# Patient Record
Sex: Female | Born: 1965 | Race: Black or African American | Hispanic: No | Marital: Single | Smoking: Current some day smoker
Health system: Southern US, Community
[De-identification: ages and names within clinical notes are randomized; demographics above are authoritative.]

## PROBLEM LIST (undated history)

## (undated) ENCOUNTER — Emergency Department (HOSPITAL_COMMUNITY): Admission: EM | Payer: Self-pay | Source: Home / Self Care

## (undated) DIAGNOSIS — C801 Malignant (primary) neoplasm, unspecified: Secondary | ICD-10-CM

## (undated) DIAGNOSIS — N200 Calculus of kidney: Secondary | ICD-10-CM

## (undated) DIAGNOSIS — E785 Hyperlipidemia, unspecified: Secondary | ICD-10-CM

## (undated) DIAGNOSIS — G8929 Other chronic pain: Secondary | ICD-10-CM

## (undated) DIAGNOSIS — M549 Dorsalgia, unspecified: Secondary | ICD-10-CM

## (undated) DIAGNOSIS — E119 Type 2 diabetes mellitus without complications: Secondary | ICD-10-CM

## (undated) DIAGNOSIS — C55 Malignant neoplasm of uterus, part unspecified: Secondary | ICD-10-CM

## (undated) DIAGNOSIS — D649 Anemia, unspecified: Secondary | ICD-10-CM

## (undated) HISTORY — PX: TUBAL LIGATION: SHX77

## (undated) HISTORY — PX: KIDNEY STONE SURGERY: SHX686

## (undated) HISTORY — DX: Other chronic pain: G89.29

## (undated) HISTORY — DX: Hyperlipidemia, unspecified: E78.5

## (undated) HISTORY — DX: Type 2 diabetes mellitus without complications: E11.9

## (undated) HISTORY — PX: ABDOMINAL HYSTERECTOMY: SHX81

## (undated) HISTORY — PX: NO PAST SURGERIES: SHX2092

## (undated) HISTORY — DX: Anemia, unspecified: D64.9

## (undated) HISTORY — DX: Dorsalgia, unspecified: M54.9

---

## 2002-03-02 ENCOUNTER — Emergency Department (HOSPITAL_COMMUNITY): Admission: EM | Admit: 2002-03-02 | Discharge: 2002-03-02 | Payer: Self-pay | Admitting: Emergency Medicine

## 2002-04-11 ENCOUNTER — Emergency Department (HOSPITAL_COMMUNITY): Admission: EM | Admit: 2002-04-11 | Discharge: 2002-04-11 | Payer: Self-pay | Admitting: Emergency Medicine

## 2002-07-25 ENCOUNTER — Emergency Department (HOSPITAL_COMMUNITY): Admission: EM | Admit: 2002-07-25 | Discharge: 2002-07-25 | Payer: Self-pay | Admitting: Emergency Medicine

## 2002-10-26 ENCOUNTER — Encounter: Payer: Self-pay | Admitting: Obstetrics

## 2002-10-26 ENCOUNTER — Ambulatory Visit (HOSPITAL_COMMUNITY): Admission: RE | Admit: 2002-10-26 | Discharge: 2002-10-26 | Payer: Self-pay | Admitting: Obstetrics

## 2002-11-05 ENCOUNTER — Emergency Department (HOSPITAL_COMMUNITY): Admission: EM | Admit: 2002-11-05 | Discharge: 2002-11-05 | Payer: Self-pay | Admitting: Emergency Medicine

## 2002-11-05 ENCOUNTER — Encounter: Payer: Self-pay | Admitting: Emergency Medicine

## 2002-11-06 ENCOUNTER — Emergency Department (HOSPITAL_COMMUNITY): Admission: EM | Admit: 2002-11-06 | Discharge: 2002-11-07 | Payer: Self-pay

## 2002-11-09 ENCOUNTER — Encounter: Payer: Self-pay | Admitting: Urology

## 2002-11-09 ENCOUNTER — Ambulatory Visit (HOSPITAL_BASED_OUTPATIENT_CLINIC_OR_DEPARTMENT_OTHER): Admission: RE | Admit: 2002-11-09 | Discharge: 2002-11-09 | Payer: Self-pay | Admitting: Urology

## 2002-11-23 ENCOUNTER — Ambulatory Visit (HOSPITAL_BASED_OUTPATIENT_CLINIC_OR_DEPARTMENT_OTHER): Admission: RE | Admit: 2002-11-23 | Discharge: 2002-11-23 | Payer: Self-pay | Admitting: Urology

## 2002-11-23 ENCOUNTER — Encounter: Payer: Self-pay | Admitting: Urology

## 2003-03-22 ENCOUNTER — Emergency Department (HOSPITAL_COMMUNITY): Admission: EM | Admit: 2003-03-22 | Discharge: 2003-03-23 | Payer: Self-pay | Admitting: Emergency Medicine

## 2003-03-31 ENCOUNTER — Emergency Department (HOSPITAL_COMMUNITY): Admission: EM | Admit: 2003-03-31 | Discharge: 2003-03-31 | Payer: Self-pay | Admitting: Emergency Medicine

## 2003-04-12 ENCOUNTER — Emergency Department (HOSPITAL_COMMUNITY): Admission: EM | Admit: 2003-04-12 | Discharge: 2003-04-12 | Payer: Self-pay | Admitting: Emergency Medicine

## 2003-04-12 ENCOUNTER — Encounter: Payer: Self-pay | Admitting: Emergency Medicine

## 2003-06-11 ENCOUNTER — Emergency Department (HOSPITAL_COMMUNITY): Admission: AD | Admit: 2003-06-11 | Discharge: 2003-06-11 | Payer: Self-pay | Admitting: Family Medicine

## 2003-08-03 ENCOUNTER — Emergency Department (HOSPITAL_COMMUNITY): Admission: AD | Admit: 2003-08-03 | Discharge: 2003-08-03 | Payer: Self-pay | Admitting: Emergency Medicine

## 2003-09-13 ENCOUNTER — Emergency Department (HOSPITAL_COMMUNITY): Admission: EM | Admit: 2003-09-13 | Discharge: 2003-09-13 | Payer: Self-pay | Admitting: Emergency Medicine

## 2003-09-18 ENCOUNTER — Emergency Department (HOSPITAL_COMMUNITY): Admission: EM | Admit: 2003-09-18 | Discharge: 2003-09-18 | Payer: Self-pay | Admitting: *Deleted

## 2003-09-24 ENCOUNTER — Emergency Department (HOSPITAL_COMMUNITY): Admission: EM | Admit: 2003-09-24 | Discharge: 2003-09-25 | Payer: Self-pay | Admitting: Emergency Medicine

## 2003-10-23 ENCOUNTER — Emergency Department (HOSPITAL_COMMUNITY): Admission: EM | Admit: 2003-10-23 | Discharge: 2003-10-23 | Payer: Self-pay | Admitting: Emergency Medicine

## 2003-10-23 ENCOUNTER — Emergency Department (HOSPITAL_COMMUNITY): Admission: EM | Admit: 2003-10-23 | Discharge: 2003-10-24 | Payer: Self-pay

## 2003-12-08 ENCOUNTER — Emergency Department (HOSPITAL_COMMUNITY): Admission: EM | Admit: 2003-12-08 | Discharge: 2003-12-08 | Payer: Self-pay | Admitting: Emergency Medicine

## 2003-12-13 ENCOUNTER — Emergency Department (HOSPITAL_COMMUNITY): Admission: EM | Admit: 2003-12-13 | Discharge: 2003-12-13 | Payer: Self-pay | Admitting: Emergency Medicine

## 2004-03-13 ENCOUNTER — Emergency Department (HOSPITAL_COMMUNITY): Admission: EM | Admit: 2004-03-13 | Discharge: 2004-03-13 | Payer: Self-pay | Admitting: Emergency Medicine

## 2004-03-23 ENCOUNTER — Emergency Department (HOSPITAL_COMMUNITY): Admission: EM | Admit: 2004-03-23 | Discharge: 2004-03-23 | Payer: Self-pay | Admitting: Emergency Medicine

## 2004-06-25 ENCOUNTER — Emergency Department (HOSPITAL_COMMUNITY): Admission: EM | Admit: 2004-06-25 | Discharge: 2004-06-25 | Payer: Self-pay | Admitting: Family Medicine

## 2004-09-19 ENCOUNTER — Emergency Department (HOSPITAL_COMMUNITY): Admission: EM | Admit: 2004-09-19 | Discharge: 2004-09-19 | Payer: Self-pay | Admitting: Emergency Medicine

## 2005-02-05 ENCOUNTER — Ambulatory Visit (HOSPITAL_COMMUNITY): Admission: RE | Admit: 2005-02-05 | Discharge: 2005-02-05 | Payer: Self-pay | Admitting: Obstetrics

## 2005-06-04 ENCOUNTER — Inpatient Hospital Stay (HOSPITAL_COMMUNITY): Admission: AD | Admit: 2005-06-04 | Discharge: 2005-06-04 | Payer: Self-pay | Admitting: Obstetrics & Gynecology

## 2005-08-24 ENCOUNTER — Inpatient Hospital Stay (HOSPITAL_COMMUNITY): Admission: AD | Admit: 2005-08-24 | Discharge: 2005-08-27 | Payer: Self-pay | Admitting: Obstetrics & Gynecology

## 2005-10-27 ENCOUNTER — Emergency Department (HOSPITAL_COMMUNITY): Admission: EM | Admit: 2005-10-27 | Discharge: 2005-10-27 | Payer: Self-pay | Admitting: Emergency Medicine

## 2005-11-04 ENCOUNTER — Ambulatory Visit: Payer: Self-pay | Admitting: Family Medicine

## 2005-11-11 ENCOUNTER — Ambulatory Visit: Payer: Self-pay | Admitting: Family Medicine

## 2005-11-24 ENCOUNTER — Ambulatory Visit: Payer: Self-pay | Admitting: Family Medicine

## 2006-02-15 ENCOUNTER — Ambulatory Visit: Payer: Self-pay | Admitting: Family Medicine

## 2006-06-01 ENCOUNTER — Emergency Department (HOSPITAL_COMMUNITY): Admission: EM | Admit: 2006-06-01 | Discharge: 2006-06-01 | Payer: Self-pay | Admitting: Pediatrics

## 2006-09-07 ENCOUNTER — Emergency Department (HOSPITAL_COMMUNITY): Admission: EM | Admit: 2006-09-07 | Discharge: 2006-09-08 | Payer: Self-pay | Admitting: Emergency Medicine

## 2006-09-13 ENCOUNTER — Ambulatory Visit: Payer: Self-pay | Admitting: Nurse Practitioner

## 2007-02-25 ENCOUNTER — Emergency Department (HOSPITAL_COMMUNITY): Admission: EM | Admit: 2007-02-25 | Discharge: 2007-02-25 | Payer: Self-pay | Admitting: Emergency Medicine

## 2007-02-28 ENCOUNTER — Ambulatory Visit: Payer: Self-pay | Admitting: Internal Medicine

## 2007-02-28 ENCOUNTER — Ambulatory Visit: Payer: Self-pay | Admitting: *Deleted

## 2007-04-11 ENCOUNTER — Encounter: Admission: RE | Admit: 2007-04-11 | Discharge: 2007-05-04 | Payer: Self-pay | Admitting: Family Medicine

## 2007-07-23 ENCOUNTER — Emergency Department (HOSPITAL_COMMUNITY): Admission: EM | Admit: 2007-07-23 | Discharge: 2007-07-23 | Payer: Self-pay | Admitting: Family Medicine

## 2007-09-07 ENCOUNTER — Ambulatory Visit: Payer: Self-pay | Admitting: Internal Medicine

## 2007-09-10 ENCOUNTER — Emergency Department (HOSPITAL_COMMUNITY): Admission: EM | Admit: 2007-09-10 | Discharge: 2007-09-10 | Payer: Self-pay | Admitting: Emergency Medicine

## 2007-10-05 ENCOUNTER — Ambulatory Visit (HOSPITAL_COMMUNITY): Admission: RE | Admit: 2007-10-05 | Discharge: 2007-10-05 | Payer: Self-pay | Admitting: Family Medicine

## 2007-11-28 ENCOUNTER — Ambulatory Visit (HOSPITAL_COMMUNITY): Admission: RE | Admit: 2007-11-28 | Discharge: 2007-11-28 | Payer: Self-pay | Admitting: Emergency Medicine

## 2008-02-28 ENCOUNTER — Ambulatory Visit: Payer: Self-pay | Admitting: Internal Medicine

## 2008-02-28 LAB — CONVERTED CEMR LAB
Albumin: 4.7 g/dL (ref 3.5–5.2)
Alkaline Phosphatase: 127 units/L — ABNORMAL HIGH (ref 39–117)
BUN: 11 mg/dL (ref 6–23)
Calcium: 10.2 mg/dL (ref 8.4–10.5)
Chloride: 95 meq/L — ABNORMAL LOW (ref 96–112)
Glucose, Bld: 475 mg/dL — ABNORMAL HIGH (ref 70–99)
Potassium: 4.5 meq/L (ref 3.5–5.3)
Sodium: 135 meq/L (ref 135–145)
Total Protein: 8.5 g/dL — ABNORMAL HIGH (ref 6.0–8.3)

## 2008-02-29 ENCOUNTER — Ambulatory Visit: Payer: Self-pay | Admitting: Family Medicine

## 2008-08-17 ENCOUNTER — Emergency Department (HOSPITAL_COMMUNITY): Admission: EM | Admit: 2008-08-17 | Discharge: 2008-08-17 | Payer: Self-pay | Admitting: Emergency Medicine

## 2008-12-21 ENCOUNTER — Emergency Department (HOSPITAL_COMMUNITY): Admission: EM | Admit: 2008-12-21 | Discharge: 2008-12-21 | Payer: Self-pay | Admitting: Emergency Medicine

## 2009-03-05 ENCOUNTER — Encounter (INDEPENDENT_AMBULATORY_CARE_PROVIDER_SITE_OTHER): Payer: Self-pay | Admitting: Internal Medicine

## 2009-04-05 ENCOUNTER — Encounter (INDEPENDENT_AMBULATORY_CARE_PROVIDER_SITE_OTHER): Payer: Self-pay | Admitting: Internal Medicine

## 2009-04-05 ENCOUNTER — Telehealth: Payer: Self-pay | Admitting: Family Medicine

## 2009-06-07 ENCOUNTER — Ambulatory Visit: Payer: Self-pay | Admitting: Internal Medicine

## 2009-06-11 ENCOUNTER — Ambulatory Visit: Payer: Self-pay | Admitting: Internal Medicine

## 2009-06-11 LAB — CONVERTED CEMR LAB: Blood Glucose, Fingerstick: 68

## 2009-06-26 ENCOUNTER — Ambulatory Visit: Payer: Self-pay | Admitting: Cardiology

## 2009-07-08 LAB — CONVERTED CEMR LAB: RBC Folate: 509 ng/mL (ref 180–600)

## 2009-07-15 ENCOUNTER — Ambulatory Visit: Payer: Self-pay | Admitting: Internal Medicine

## 2009-07-15 ENCOUNTER — Encounter (HOSPITAL_COMMUNITY): Admission: RE | Admit: 2009-07-15 | Discharge: 2009-09-30 | Payer: Self-pay | Admitting: Cardiology

## 2009-07-15 ENCOUNTER — Ambulatory Visit: Payer: Self-pay

## 2009-07-15 ENCOUNTER — Ambulatory Visit (HOSPITAL_COMMUNITY): Admission: RE | Admit: 2009-07-15 | Discharge: 2009-07-15 | Payer: Self-pay | Admitting: Cardiology

## 2009-07-30 ENCOUNTER — Ambulatory Visit: Payer: Self-pay | Admitting: Cardiology

## 2009-08-16 ENCOUNTER — Ambulatory Visit: Payer: Self-pay | Admitting: Internal Medicine

## 2009-08-16 LAB — CONVERTED CEMR LAB: Blood Glucose, Fingerstick: 286

## 2009-08-21 ENCOUNTER — Encounter: Payer: Self-pay | Admitting: Internal Medicine

## 2009-08-21 ENCOUNTER — Encounter (INDEPENDENT_AMBULATORY_CARE_PROVIDER_SITE_OTHER): Payer: Self-pay | Admitting: Internal Medicine

## 2009-08-21 ENCOUNTER — Ambulatory Visit (HOSPITAL_COMMUNITY): Admission: RE | Admit: 2009-08-21 | Discharge: 2009-08-21 | Payer: Self-pay | Admitting: Internal Medicine

## 2009-08-30 ENCOUNTER — Ambulatory Visit: Payer: Self-pay | Admitting: Internal Medicine

## 2009-09-04 ENCOUNTER — Emergency Department (HOSPITAL_COMMUNITY): Admission: EM | Admit: 2009-09-04 | Discharge: 2009-09-04 | Payer: Self-pay | Admitting: Emergency Medicine

## 2009-09-28 LAB — CONVERTED CEMR LAB
AST: 14 units/L (ref 0–37)
Albumin: 4.2 g/dL (ref 3.5–5.2)
Alkaline Phosphatase: 88 units/L (ref 39–117)
HDL: 35 mg/dL — ABNORMAL LOW (ref >39)
Indirect Bilirubin: 0.7 mg/dL (ref 0.0–0.9)
Total Bilirubin: 0.9 mg/dL (ref 0.3–1.2)
Triglycerides: 122 mg/dL (ref <150)

## 2009-10-04 ENCOUNTER — Encounter (INDEPENDENT_AMBULATORY_CARE_PROVIDER_SITE_OTHER): Payer: Self-pay | Admitting: Internal Medicine

## 2009-10-17 ENCOUNTER — Ambulatory Visit: Payer: Self-pay | Admitting: Internal Medicine

## 2009-10-17 LAB — CONVERTED CEMR LAB
Bilirubin Urine: NEGATIVE
Chlamydia, DNA Probe: NEGATIVE
GC Probe Amp, Genital: NEGATIVE
Hgb A1c MFr Bld: 10.7 %
KOH Prep: NEGATIVE
Ketones, urine, test strip: NEGATIVE
Nitrite: NEGATIVE
Protein, U semiquant: NEGATIVE
Urobilinogen, UA: 0.2
Whiff Test: NEGATIVE

## 2009-10-29 ENCOUNTER — Emergency Department (HOSPITAL_COMMUNITY): Admission: EM | Admit: 2009-10-29 | Discharge: 2009-10-29 | Payer: Self-pay | Admitting: Emergency Medicine

## 2009-11-28 ENCOUNTER — Ambulatory Visit: Payer: Self-pay | Admitting: Internal Medicine

## 2009-12-02 ENCOUNTER — Encounter (INDEPENDENT_AMBULATORY_CARE_PROVIDER_SITE_OTHER): Payer: Self-pay | Admitting: Internal Medicine

## 2009-12-04 ENCOUNTER — Telehealth (INDEPENDENT_AMBULATORY_CARE_PROVIDER_SITE_OTHER): Payer: Self-pay | Admitting: *Deleted

## 2009-12-11 ENCOUNTER — Ambulatory Visit (HOSPITAL_BASED_OUTPATIENT_CLINIC_OR_DEPARTMENT_OTHER): Admission: RE | Admit: 2009-12-11 | Discharge: 2009-12-11 | Payer: Self-pay | Admitting: Internal Medicine

## 2009-12-11 ENCOUNTER — Encounter (INDEPENDENT_AMBULATORY_CARE_PROVIDER_SITE_OTHER): Payer: Self-pay | Admitting: Internal Medicine

## 2009-12-14 ENCOUNTER — Ambulatory Visit: Payer: Self-pay | Admitting: Internal Medicine

## 2010-01-01 ENCOUNTER — Encounter (INDEPENDENT_AMBULATORY_CARE_PROVIDER_SITE_OTHER): Payer: Self-pay | Admitting: Internal Medicine

## 2010-01-15 ENCOUNTER — Ambulatory Visit: Payer: Self-pay | Admitting: Internal Medicine

## 2010-01-17 ENCOUNTER — Ambulatory Visit: Payer: Self-pay | Admitting: Physician Assistant

## 2010-01-17 LAB — CONVERTED CEMR LAB
AST: 12 units/L (ref 0–37)
Albumin: 4.2 g/dL (ref 3.5–5.2)
Alkaline Phosphatase: 80 units/L (ref 39–117)
BUN: 13 mg/dL (ref 6–23)
Blood Glucose, Fingerstick: 227
HDL: 33 mg/dL — ABNORMAL LOW (ref >39)
LDL Cholesterol: 116 mg/dL — ABNORMAL HIGH (ref 0–99)
Potassium: 4.3 meq/L (ref 3.5–5.3)
Sodium: 136 meq/L (ref 135–145)
Total Protein: 7.3 g/dL (ref 6.0–8.3)
VLDL: 26 mg/dL (ref 0–40)

## 2010-01-21 ENCOUNTER — Ambulatory Visit (HOSPITAL_COMMUNITY): Admission: RE | Admit: 2010-01-21 | Discharge: 2010-01-21 | Payer: Self-pay | Admitting: Internal Medicine

## 2010-02-11 ENCOUNTER — Ambulatory Visit: Payer: Self-pay | Admitting: Internal Medicine

## 2010-02-11 LAB — CONVERTED CEMR LAB: Insulin: 13 microintl units/mL (ref 3–28)

## 2010-03-11 ENCOUNTER — Ambulatory Visit: Payer: Self-pay | Admitting: Internal Medicine

## 2010-04-29 ENCOUNTER — Ambulatory Visit: Payer: Self-pay | Admitting: Internal Medicine

## 2010-05-09 ENCOUNTER — Ambulatory Visit: Payer: Self-pay | Admitting: Family Medicine

## 2010-05-13 ENCOUNTER — Ambulatory Visit: Payer: Self-pay | Admitting: Internal Medicine

## 2010-05-13 LAB — CONVERTED CEMR LAB: Blood Glucose, Fingerstick: 197

## 2010-05-14 LAB — CONVERTED CEMR LAB
BUN: 8 mg/dL (ref 6–23)
Chloride: 100 meq/L (ref 96–112)
Creatinine, Ser: 0.66 mg/dL (ref 0.40–1.20)
Glucose, Bld: 215 mg/dL — ABNORMAL HIGH (ref 70–99)

## 2010-08-23 ENCOUNTER — Emergency Department (HOSPITAL_COMMUNITY)
Admission: EM | Admit: 2010-08-23 | Discharge: 2010-08-23 | Payer: Self-pay | Source: Home / Self Care | Admitting: Emergency Medicine

## 2010-08-23 LAB — BASIC METABOLIC PANEL
CO2: 28 mEq/L (ref 19–32)
Calcium: 9.3 mg/dL (ref 8.4–10.5)
Creatinine, Ser: 0.64 mg/dL (ref 0.4–1.2)
GFR calc Af Amer: >60 mL/min (ref >60)
GFR calc non Af Amer: >60 mL/min (ref >60)
Sodium: 136 mEq/L (ref 135–145)

## 2010-08-23 LAB — DIFFERENTIAL
Eosinophils Absolute: 0.1 10*3/uL (ref 0.0–0.7)
Eosinophils Relative: 1 % (ref 0–5)
Lymphs Abs: 1.9 10*3/uL (ref 0.7–4.0)
Monocytes Absolute: 0.3 10*3/uL (ref 0.1–1.0)
Monocytes Relative: 4 % (ref 3–12)

## 2010-08-23 LAB — CBC
Hemoglobin: 13.1 g/dL (ref 12.0–15.0)
MCHC: 36.3 g/dL — ABNORMAL HIGH (ref 30.0–36.0)
Platelets: 311 10*3/uL (ref 150–400)
RDW: 15 % (ref 11.5–15.5)

## 2010-08-24 LAB — CONVERTED CEMR LAB
ALT: 16 units/L (ref 0–35)
AST: 11 units/L (ref 0–37)
Alkaline Phosphatase: 72 units/L (ref 39–117)
Basophils Absolute: 0 10*3/uL (ref 0.0–0.1)
Basophils Relative: 0 % (ref 0–1)
LDL Cholesterol: 132 mg/dL — ABNORMAL HIGH (ref 0–99)
MCHC: 33.1 g/dL (ref 30.0–36.0)
Monocytes Relative: 7 % (ref 3–12)
Neutro Abs: 4.1 10*3/uL (ref 1.7–7.7)
Neutrophils Relative %: 60 % (ref 43–77)
Platelets: 349 10*3/uL (ref 150–400)
Pro B Natriuretic peptide (BNP): 24.2 pg/mL (ref 0.0–100.0)
RBC: 4.91 M/uL (ref 3.87–5.11)
RDW: 16.2 % — ABNORMAL HIGH (ref 11.5–15.5)
Sodium: 139 meq/L (ref 135–145)
Total Bilirubin: 0.5 mg/dL (ref 0.3–1.2)
Total CHOL/HDL Ratio: 6
Total Protein: 7.1 g/dL (ref 6.0–8.3)
VLDL: 24 mg/dL (ref 0–40)

## 2010-08-26 NOTE — Letter (Signed)
Summary: NO RECORDS FOUND FOR THIS PT  NO RECORDS FOUND FOR THIS PT   Imported By: Arta Bruce 03/17/2010 11:40:35  _____________________________________________________________________  External Attachment:    Type:   Image     Comment:   External Document

## 2010-08-26 NOTE — Letter (Signed)
Summary: APPT CONFIRMATION//PT AWARE  APPT CONFIRMATION//PT AWARE   Imported By: Arta Bruce 01/09/2010 12:19:01  _____________________________________________________________________  External Attachment:    Type:   Image     Comment:   External Document

## 2010-08-26 NOTE — Assessment & Plan Note (Signed)
Summary: 3 MONTH FU ON DM/DYSPENA//KT   Vital Signs:  Patient profile:   45 year old female Menstrual status:  irregular Height:      58 inches Weight:      198 pounds BMI:     41.53 Temp:     97.8 degrees F oral Pulse rate:   86 / minute Pulse rhythm:   regular Resp:     18 per minute BP sitting:   129 / 85  (left arm) Cuff size:   regular  Vitals Entered By: Armenia Shannon (January 17, 2010 10:22 AM) CC: three month f/u... pt says her back hurts... Is Patient Diabetic? Yes Pain Assessment Patient in pain? no      CBG Result 227  Does patient need assistance? Functional Status Self care Ambulation Normal   Primary Care Provider:  Julieanne Manson MD  CC:  three month f/u... pt says her back hurts....  History of Present Illness: Here for f/u.  Did not know she was not seeing her PCP today.  DM:  Losing weight.  Has been walking more.  Losing her appetite.  Notes symptoms of early satiety.  Notes some nausea.  No vomiting.  No diarrhea or constipation.  No hematemesis.  Recently put on Dexilant.  Notes no more throat burning.  Has never tried reglan.  Sugars at home running 100s-150s.  May have one or 2 readings over 200.  Took meds today around 8am.  Ate before that.  Only ate 1/2 piece of toast.  Drinks water.  Does not drink juice or sodas.    Lipids:  LDL over 100 by recent draw.  Terri Rorrer states she is taking both Lipitor and Pravastatin.  States pharmacy gave her both.  Has been doing this for about a month now.    Low back pain:  Chronic since she was a child.  Points to lumbar area.  Notes pain "most of the time."  Cannot really specify when it is worse.  Comes and goes.  Notes pain down right leg.  Points to L4-5 dermatome.  No pain past about the mid calf.  States leg gives out on her.  She works as a Advertising copywriter.  Does note that bending or stooping makes it worse sometimes.  No loss of b/b fxn.  States she had a test done at [facility] and was told she had a ruptured  disc.  Was told she should never have her back operated on.  The only studay available in echart is an xray from 2005 that was normal.  See below.  Dyspnea:  Has seen Dr. Sherene Sires.  Coreg was d/c'd and PPI was added.  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  She had no response to short acting beta agonist.  He felt she had a combo of VCD and obesity/deconditioning.  Current Medications (verified): 1)  Amaryl 4 Mg  Tabs (Glimepiride) .... One Tablet By Mouth Twice Daily 2)  Metformin Hcl 1000 Mg  Tabs (Metformin Hcl) .... One Tablet  By Mouth Twice Daily 3)  Glucometer Elite Classic   Kit (Blood Glucose Monitoring Suppl) .... Two Times A Day Blood Sugar Checks 4)  Glucometer Elite Test   Strp (Glucose Blood) .... Two Times A Day Sugar Checks 5)  Aspir-Low 81 Mg Tbec (Aspirin) .Marland Kitchen.. 1 Tab By Mouth Daily 6)  Pravastatin Sodium 40 Mg Tabs (Pravastatin Sodium) .Marland Kitchen.. 1 Tab By Mouth Daily 7)  Ferrous Sulfate 325 (65 Fe) Mg Tabs (Ferrous Sulfate) .Marland KitchenMarland KitchenMarland Kitchen  1 Tab By Mouth Daily 8)  Lipitor 40 Mg Tabs (Atorvastatin Calcium) .Marland Kitchen.. 1 Once Daily 9)  Ibuprofen 800 Mg Tabs (Ibuprofen) .Marland Kitchen.. 1 Tab By Mouth Two Times A Day 10)  Nitrostat 0.4 Mg Subl (Nitroglycerin) .Marland Kitchen.. 1 Sublingual As Needed Chest Discomfort.  May Repeat Every 5 Minutes X2 If Chest Discomfort Not Relieved--Lie Down After Taking--With Head Elevated 11)  Prilosec Otc 20 Mg Tbec (Omeprazole Magnesium) .... Take  One 30-60 Min Before First Meal of The Day  Allergies (verified): 1)  ! Oxycodone-Acetaminophen (Oxycodone-Acetaminophen)  Physical Exam  General:  alert, well-developed, and well-nourished.   Head:  normocephalic and atraumatic.   Neck:  supple.   Lungs:  normal breath sounds.   Heart:  normal rate and regular rhythm.   Abdomen:  soft and non-tender.   Msk:  no callus or ulcers on feet bilat SLR neg bilat tight paraspinals bilat in lumbar region Extremities:  no edema  Neurologic:  alert & oriented X3, cranial nerves II-XII  intact, strength normal in all extremities, and DTRs symmetrical and normal.   Psych:  normally interactive and good eye contact.    Diabetes Management Exam:    Foot Exam (with socks and/or shoes not present):       Sensory-Monofilament:          Left foot: normal          Right foot: normal   Impression & Recommendations:  Problem # 1:  DIABETES MELLITUS, TYPE II, UNCONTROLLED (ICD-250.02)  sugars at home sound good but, did not eat much today and has a sugar of 227 suspect she will need insulin no in house A1C's will get A1C first and then decide discussed with her today that she will likely need insulin will set up appt with Susie for diet edu. as Dr. Sherene Sires rec this recently if she needs insulin, will start Lanuts 10 units at bedtime and have her start after she sees Susie for education  Her updated medication list for this problem includes:    Amaryl 4 Mg Tabs (Glimepiride) ..... One tablet by mouth twice daily    Metformin Hcl 1000 Mg Tabs (Metformin hcl) ..... One tablet  by mouth twice daily    Aspir-low 81 Mg Tbec (Aspirin) .Marland Kitchen... 1 tab by mouth daily  Orders: Capillary Blood Glucose/CBG (82948) T- Hemoglobin A1C (95638-75643) T-Urine Microalbumin w/creat. ratio ([phone])  Problem # 2:  MIXED HYPERLIPIDEMIA (ICD-272.2) will check on dose of statin has Lipitor 10 mg at bedtime will stop pravastatin change lipitor to 40 and repeat labs in 3 mos  The following medications were removed from the medication list:    Pravastatin Sodium 40 Mg Tabs (Pravastatin sodium) .Marland Kitchen... 1 tab by mouth daily Her updated medication list for this problem includes:    Lipitor 40 Mg Tabs (Atorvastatin calcium) .Marland Kitchen... Take 1 tab by mouth at bedtime for cholesterol  Problem # 3:  ROUTINE GYNECOLOGICAL EXAMINATION (ICD-V72.31) never got mammo  Orders: Mammogram (Screening) (Mammo)  Problem # 4:  LOW BACK PAIN, CHRONIC (ICD-724.2)  ? radicular symptoms normal xray in 2005 get  xray continue ibuprofen flexeril as needed refer to PT if xray completely normal  Her updated medication list for this problem includes:    Aspir-low 81 Mg Tbec (Aspirin) .Marland Kitchen... 1 tab by mouth daily    Ibuprofen 800 Mg Tabs (Ibuprofen) .Marland Kitchen... 1 tab by mouth two times a day    Flexeril 10 Mg Tabs (Cyclobenzaprine hcl) .Marland Kitchen... Take 1 tab by mouth  at bedtime as needed muscle spasm or back pain  Orders: Diagnostic X-Ray/Fluoroscopy (Diagnostic X-Ray/Flu)  Problem # 5:  EARLY SATIETY (ICD-780.94) weighed 205 in March ? gastroparesis check UGI series with small bowel follow through may need gastric emptying study consider reglan after above completed  Orders: Nuclear Medicine (Nuclear Medicine)  Problem # 6:  DYSPNEA (UJW-119.14) following with Dr. Sherene Sires doing better with PPI and exercise  Complete Medication List: 1)  Amaryl 4 Mg Tabs (Glimepiride) .... One tablet by mouth twice daily 2)  Metformin Hcl 1000 Mg Tabs (Metformin hcl) .... One tablet  by mouth twice daily 3)  Glucometer Elite Classic Kit (Blood glucose monitoring suppl) .... Two times a day blood sugar checks 4)  Glucometer Elite Test Strp (Glucose blood) .... Two times a day sugar checks 5)  Aspir-low 81 Mg Tbec (Aspirin) .Marland Kitchen.. 1 tab by mouth daily 6)  Ferrous Sulfate 325 (65 Fe) Mg Tabs (Ferrous sulfate) .Marland Kitchen.. 1 tab by mouth daily 7)  Lipitor 40 Mg Tabs (Atorvastatin calcium) .... Take 1 tab by mouth at bedtime for cholesterol 8)  Ibuprofen 800 Mg Tabs (Ibuprofen) .Marland Kitchen.. 1 tab by mouth two times a day 9)  Nitrostat 0.4 Mg Subl (Nitroglycerin) .Marland Kitchen.. 1 sublingual as needed chest discomfort.  may repeat every 5 minutes x2 if chest discomfort not relieved--lie down after taking--with head elevated 10)  Prilosec Otc 20 Mg Tbec (Omeprazole magnesium) .... Take  one 30-60 min before first meal of the day 11)  Flexeril 10 Mg Tabs (Cyclobenzaprine hcl) .... Take 1 tab by mouth at bedtime as needed muscle spasm or back pain  Patient  Instructions: 1)  Schedule appointment with Drucilla Schmidt for diet education with diabetes.  If patient started on insulin, she will bring with her to learn how to use. 2)  Schedule [facility]asure at [facility]. 3)  Stop Pravastatin. 4)  New prescription for Lipitor is for 40 mg.  You will need to schedule a lab visit 3 months after starting the new dose for fasting lipids and LFTs. 5)  Take ibuprofen 800 mg three times a day with food as needed for pain. 6)  Use the flexeril at bedtime as needed for back pain. 7)  Get the xrays.  We will call you if I decide to send you to PT. 8)  Please schedule a follow-up appointment in 3 months with Dr. Delrae Alfred for diabetes. 9)    Prescriptions: FLEXERIL 10 MG TABS (CYCLOBENZAPRINE HCL) Take 1 tab by mouth at bedtime as needed muscle spasm or back pain  #30 x 1   Entered and Authorized by:   Tereso Newcomer PA-C   Signed by:   Tereso Newcomer PA-C on 01/17/2010   Method used:   Print then Give to Patient   RxID:   [id number] LIPITOR 40 MG TABS (ATORVASTATIN CALCIUM) Take 1 tab by mouth at bedtime for cholesterol  #30 x 5   Entered and Authorized by:   Tereso Newcomer PA-C   Signed by:   Tereso Newcomer PA-C on 01/17/2010   Method used:   Print then Give to Patient   RxID:   [id number]   Last LDL:                                                 154 (08/30/2009 9:15:00 PM)  Diabetic Foot Exam Last Podiatry Exam Date: 01/17/2010  Foot Inspection Is there a history of a foot ulcer?              No Is there a foot ulcer now?              No Can the patient see the bottom of their feet?          Yes Are the shoes appropriate in style and fit?          Yes Is there swelling or an abnormal foot shape?          No Are the toenails long?                No Are the toenails thick?                No Are the toenails ingrown?              No Is there heavy callous build-up?              No Is there a claw toe deformity?                          No Is  there elevated skin temperature?            No Is there limited ankle dorsiflexion?            No Is there foot or ankle muscle weakness?            No Do you have pain in calf while walking?           No      Diabetic Foot Care Education :Patient educated on appropriate care of diabetic feet.     10-g (5.07) Semmes-Weinstein Monofilament Test Performed by: Armenia Shannon          Right Foot          Left Foot Visual Inspection               Test Control      normal         normal Site 1         normal         normal Site 2         normal         normal Site 3         normal         normal Site 4         normal         normal Site 5         normal         normal Site 6         normal         normal Site 7         normal         normal Site 8         normal         normal Site 9         normal         normal Site 10         normal         normal  Impression      normal         normal    X-ray Musculoskeletal  Procedure date:  08/03/2003  Findings:      Exam Type: Exam Type: L-Spine  Results: COMPLETE LUMBAR SPINE - 4 VIEWS (08/03/03)   There are five typical lumbar segments.  There is no significant disc   space narrowing or spondylolisthesis or spondylolysis.  No   appreciable facet joint arthritis.   IMPRESSION   Normal lumbar spine.

## 2010-08-26 NOTE — Assessment & Plan Note (Signed)
Summary: 3 MONTH FU ON DM////KT   Vital Signs:  Patient profile:   45 year old female Menstrual status:  irregular LMP:     04/05/2010 Weight:      195.2 pounds Temp:     98.1 degrees F Pulse rate:   73 / minute BP sitting:   116 / 90  (left arm) Cuff size:   large  Vitals Entered By: Michelle Nasuti (April 29, 2010 12:37 PM) CC: FOLLOW UP VISIT. C/O LEG AND BACK NUMBNESS. MED CONCERNS WITH ACTOS PT HAS SEEN TV ADD AND HAS QUESTIONS. PT STATES SHE BREAKS OUT IN HIVES DAILY Is Patient Diabetic? Yes Pain Assessment Patient in pain? yes      Intensity: 8 CBG Result 229 CBG Device ID F [id number]    Does patient need assistance? Ambulation Normal LMP (date): 04/05/2010     Enter LMP: 04/05/2010 Last PAP Result NEGATIVE FOR INTRAEPITHELIAL LESIONS OR MALIGNANCY.   Primary Care Provider:  Julieanne Manson MD  CC:  FOLLOW UP VISIT. C/O LEG AND BACK NUMBNESS. MED CONCERNS WITH [facility] PT HAS SEEN TV ADD AND HAS QUESTIONS. PT STATES SHE BREAKS OUT IN HIVES DAILY.  History of Present Illness: 1. Rash:  Has been having problems with this for months.  No new exposures that she is aware of.  Has been to ED for this as well.   Was given Benadryl, prednisone and Bactrim and diagnosed with folliculitis, not hives when seen for this in April.  States comes and goes--can be on face, arms and posterior neck.  Right now, no rash.    2.  Back Pain:  Ibuprofen does not help.  Did not ever get referred to PT.  No improvement since last seen.  3.  Itching feet: scratches at them until they peel.   Right great toe with soreness near side of nail medially.  4.  DM:  Sugars in mid to high 100s.  Has had flu vaccine today.  Has had eye check this year--cannot recall eye doctor's name--states no problems.  5.  Elevated bp:  no history of hypertension.  Current Medications (verified): 1)  Amaryl 4 Mg  Tabs (Glimepiride) .... One Tablet By Mouth Twice Daily 2)  Metformin Hcl 1000 Mg  Tabs (Metformin  Hcl) .... One Tablet  By Mouth Twice Daily 3)  Glucometer Elite Classic   Kit (Blood Glucose Monitoring Suppl) .... Two Times A Day Blood Sugar Checks 4)  Glucometer Elite Test   Strp (Glucose Blood) .... Two Times A Day Sugar Checks 5)  Aspir-Low 81 Mg Tbec (Aspirin) .Marland Kitchen.. 1 Tab By Mouth Daily 6)  Ferrous Sulfate 325 (65 Fe) Mg Tabs (Ferrous Sulfate) .Marland Kitchen.. 1 Tab By Mouth Daily 7)  Lipitor 40 Mg Tabs (Atorvastatin Calcium) .... Take 1 Tab By Mouth At Bedtime For Cholesterol 8)  Ibuprofen 800 Mg Tabs (Ibuprofen) .Marland Kitchen.. 1 Tab By Mouth Two Times A Day 9)  Nitrostat 0.4 Mg Subl (Nitroglycerin) .Marland Kitchen.. 1 Sublingual As Needed Chest Discomfort.  May Repeat Every 5 Minutes X2 If Chest Discomfort Not Relieved--Lie Down After Taking--With Head Elevated 10)  Prilosec Otc 20 Mg Tbec (Omeprazole Magnesium) .... Take  One 30-60 Min Before First Meal of The Day 11)  Flexeril 10 Mg Tabs (Cyclobenzaprine Hcl) .... Take 1 Tab By Mouth At Bedtime As Needed Muscle Spasm or Back Pain 12)  Actos 45 Mg Tabs (Pioglitazone Hcl) .Marland Kitchen.. 1 Tab By Mouth Daily  Allergies (verified): 1)  ! Oxycodone-Acetaminophen (Oxycodone-Acetaminophen)  Physical Exam  Lungs:  Normal respiratory effort, chest expands symmetrically. Lungs are clear to auscultation, no crackles or wheezes. Heart:  Normal rate and regular rhythm. S1 and S2 normal without gallop, murmur, click, rub or other extra sounds.  Radial pulses normal and equal Extremities:  Feet with some flaking on plantar surfaces. Skin:  Dry skin with scratched off areas on right arm in particular.  Flaking in nasolabial fold of left face. No rash on neck or back today.   Impression & Recommendations:  Problem # 1:  ELEVATED BLOOD PRESSURE WITHOUT DIAGNOSIS OF HYPERTENSION (ICD-796.2) Reevaluate in 2 weeks.  Problem # 2:  FOLLICULITIS (ICD-704.8) No findings today.  Problem # 3:  TINEA PEDIS (ICD-110.4)  Her updated medication list for this problem includes:    Lotrisone 1-0.05  % Crea (Clotrimazole-betamethasone) .Marland Kitchen... Apply two times a day to feet for 3 weeks  Problem # 4:  DRY SKIN (ICD-701.1) Discussed good skin care, hydration To use Clobetasol on body only, not face--if patches of significant itching and flaking. May have mild seborrhea of face as well.  Problem # 5:  DEGENERATIVE DISC DISEASE, LUMBAR SPINE (ICD-722.52) PT referral Diclofenax--may need to stop if bp stays elevated.  Problem # 6:  DIABETES MELLITUS, TYPE II, UNCONTROLLED (ICD-250.02) Checked on  Actos and concern for bladder CA--at this point, no definite increase in risk in group taking Actos as a whole, but does appear more concerning for those who have longest cumulative exposure--called pt. later and she elected to stay on for now to see if DM better controlled. Consider Byetta if need improved control. Flu vaccine today Her updated medication list for this problem includes:    Amaryl 4 Mg Tabs (Glimepiride) ..... One tablet by mouth twice daily    Metformin Hcl 1000 Mg Tabs (Metformin hcl) ..... One tablet  by mouth twice daily    Aspir-low 81 Mg Tbec (Aspirin) .Marland Kitchen... 1 tab by mouth daily    Actos 45 Mg Tabs (Pioglitazone hcl) .Marland Kitchen... 1 tab by mouth daily  Orders: T-Basic Metabolic Panel [phone]) T- Hemoglobin A1C ([id number])  Complete Medication List: 1)  Amaryl 4 Mg Tabs (Glimepiride) .... One tablet by mouth twice daily 2)  Metformin Hcl 1000 Mg Tabs (Metformin hcl) .... One tablet  by mouth twice daily 3)  Glucometer Elite Classic Kit (Blood glucose monitoring suppl) .... Two times a day blood sugar checks 4)  Glucometer Elite Test Strp (Glucose blood) .... Two times a day sugar checks 5)  Aspir-low 81 Mg Tbec (Aspirin) .Marland Kitchen.. 1 tab by mouth daily 6)  Ferrous Sulfate 325 (65 Fe) Mg Tabs (Ferrous sulfate) .Marland Kitchen.. 1 tab by mouth daily 7)  Lipitor 40 Mg Tabs (Atorvastatin calcium) .... Take 1 tab by mouth at bedtime for cholesterol 8)  Nitrostat 0.4 Mg Subl (Nitroglycerin) .Marland Kitchen.. 1  sublingual as needed chest discomfort.  may repeat every 5 minutes x2 if chest discomfort not relieved--lie down after taking--with head elevated 9)  Prilosec Otc 20 Mg Tbec (Omeprazole magnesium) .... Take  one 30-60 min before first meal of the day 10)  Flexeril 10 Mg Tabs (Cyclobenzaprine hcl) .... Take 1 tab by mouth at bedtime as needed muscle spasm or back pain 11)  Actos 45 Mg Tabs (Pioglitazone hcl) .Marland Kitchen.. 1 tab by mouth daily 12)  Clobetasol Propionate 0.05 % Crea (Clobetasol propionate) .... Apply to affected areas of skin two times a day --not to face 13)  Diclofenac Sodium 75 Mg Tbec (Diclofenac sodium) .Marland Kitchen.. 1 tab by mouth two  times a day with food. 14)  Lotrisone 1-0.05 % Crea (Clotrimazole-betamethasone) .... Apply two times a day to feet for 3 weeks 15)  Doxycycline Hyclate 100 Mg Caps (Doxycycline hyclate) .... Take one capsule two times a day for 10 days  Other Orders: Influenza Vaccine NON MCR (21308) Physical Therapy Referral (PT)  Patient Instructions: 1)  Eucerin cream to skin--all over two times a day, especially after bathing. 2)  BP check in 2 weeks--nurse visit only 3)  Follow up with Dr. Delrae Alfred in 4 months  Prescriptions: DOXYCYCLINE HYCLATE 100 MG CAPS (DOXYCYCLINE HYCLATE) Take one capsule two times a day for 10 days  #20 x 0   Entered by:   Dutch Quint RN   Authorized by:   Lehman Prom FNP   Signed by:   Dutch Quint RN on 05/13/2010   Method used:   Print then Give to Patient   RxID:   [id number] LOTRISONE 1-0.05 % CREA (CLOTRIMAZOLE-BETAMETHASONE) apply two times a day to feet for 3 weeks  #60 g x 1   Entered and Authorized by:   Julieanne Manson MD   Signed by:   Julieanne Manson MD on 04/29/2010   Method used:   Faxed to ...       [facility] (retail)       [address] [place], [place]  [address]       Ph: [phone]       Fax: [phone]   RxID:   [id number] DICLOFENAC SODIUM 75  MG TBEC (DICLOFENAC SODIUM) 1 tab by mouth two times a day with food.  #60 x 4   Entered and Authorized by:   Julieanne Manson MD   Signed by:   Julieanne Manson MD on 04/29/2010   Method used:   Electronically to        [facility] [address] (retail)       [phone]       [place], [place]  [address]       Ph: [phone]       Fax: [phone]   RxID:   [id number] CLOBETASOL PROPIONATE 0.05 % CREA (CLOBETASOL PROPIONATE) apply to affected areas of skin two times a day --not to face  #60 g x 1   Entered and Authorized by:   Julieanne Manson MD   Signed by:   Julieanne Manson MD on 04/29/2010   Method used:   Faxed to ...       [facility] (retail)       [address] [place], [place]  [address]       Ph: [phone]       Fax: [phone]   RxID:   [id number]    Influenza Vaccine    Vaccine Type: Fluvax Non-MCR    Site: left deltoid    Mfr: GlaxoSmithKline    Dose: 0.5 ml    Route: IM    Given by: Michelle Nasuti    Exp. Date: 01/24/2011    Lot #: [id number]    VIS given: 02/18/10 version given April 29, 2010.  Flu Vaccine Consent Questions    Do you have a history of severe allergic reactions to this vaccine? no    Any prior history of allergic reactions to egg and/or gelatin? no    Do you have a sensitivity to  the preservative Thimersol? no    Do you have a past history of Guillan-Barre Syndrome? no    Do you currently have an acute febrile illness? no    Have you ever had a severe reaction to latex? no    Vaccine information given and explained to patient? yes    Are you currently pregnant? no

## 2010-08-26 NOTE — Letter (Signed)
Summary: NUTRITION SUMMARY//SUSIE  NUTRITION SUMMARY//SUSIE   Imported By: Arta Bruce 03/26/2010 11:16:43  _____________________________________________________________________  External Attachment:    Type:   Image     Comment:   External Document

## 2010-08-26 NOTE — Assessment & Plan Note (Signed)
Summary: INSULIN LEVEL PER NURSE / NS  Nurse Visit  CC: pt. complains of LBP radiating down legs request Ibuprofen refill   Allergies: 1)  ! Oxycodone-Acetaminophen (Oxycodone-Acetaminophen)  Orders Added: 1)  Est. Patient Level I [[address]] 2)  T-Insulin, Random [[id number]]

## 2010-08-26 NOTE — Assessment & Plan Note (Signed)
Summary: FU FOR CPP///FU FOR DYSPNEA///KT   Vital Signs:  Patient profile:   45 year old female Menstrual status:  irregular LMP:     09/21/2009 Weight:      205 pounds BMI:     43.00 Temp:     98.3 degrees F Pulse rate:   72 / minute Pulse rhythm:   regular Resp:     20 per minute BP sitting:   107 / 71  (left arm) Cuff size:   regular  Vitals Entered By: Chauncy Passy, SMA CC: Pt. is here for a 45 y/o CPP. Pt. is in pain in her back that radiates down to both legs. Pt. would like to know the results from the breathing exam done at [facility].  Is Patient Diabetic? Yes Did you bring your meter with you today? No Pain Assessment Patient in pain? yes     Location: back Intensity: 7 Type: sharp Onset of pain  Constant CBG Result 214  Does patient need assistance? Functional Status Self care Ambulation Normal LMP (date): 09/21/2009     Enter LMP: 09/21/2009   Primary Care Provider:  Julieanne Manson MD  CC:  Pt. is here for a 45 y/o CPP. Pt. is in pain in her back that radiates down to both legs. Pt. would like to know the results from the breathing exam done at [facility]. Marland Kitchen  History of Present Illness: 45 yo female here for CPP.  Concerns:  1.  Dyspnea:  have not seen PFT results--pt. states they were done--calling for that report.  CXR was normal.  Pt. still having problems with breathing.  Habits & Providers  Alcohol-Tobacco-Diet     Alcohol drinks/day: once weekly     Tobacco Status: current     Cigarette Packs/Day: 1 cigarette daily     Year Started: age 66  Exercise-Depression-Behavior     Drug Use: never  Current Medications (verified): 1)  Amaryl 4 Mg  Tabs (Glimepiride) .... One Tablet By Mouth Twice Daily 2)  Metformin Hcl 1000 Mg  Tabs (Metformin Hcl) .... One Tablet  By Mouth Twice Daily 3)  Glucometer Elite Classic   Kit (Blood Glucose Monitoring Suppl) .... Two Times A Day Blood Sugar Checks 4)  Glucometer Elite Test   Strp (Glucose Blood)  .... Two Times A Day Sugar Checks 5)  Ibuprofen 800 Mg Tabs (Ibuprofen) .Marland Kitchen.. 1 Tab By Mouth Two Times A Day 6)  Aspir-Low 81 Mg Tbec (Aspirin) .Marland Kitchen.. 1 Tab By Mouth Daily 7)  Carvedilol 6.25 Mg Tabs (Carvedilol) .Marland Kitchen.. 1 Tab By Mouth Two Times A Day 8)  Pravastatin Sodium 40 Mg Tabs (Pravastatin Sodium) .Marland Kitchen.. 1 Tab By Mouth Daily 9)  Nitrostat 0.4 Mg Subl (Nitroglycerin) .Marland Kitchen.. 1 Sublingual As Needed Chest Discomfort.  May Repeat Every 5 Minutes X2 If Chest Discomfort Not Relieved--Lie Down After Taking--With Head Elevated 10)  Ferrous Sulfate 325 (65 Fe) Mg Tabs (Ferrous Sulfate) .Marland Kitchen.. 1 Tab By Mouth Daily  Allergies (verified): 1)  ! Oxycodone-Acetaminophen (Oxycodone-Acetaminophen)  Past History:  Past Surgical History: 1.  2005:  Lithotripsy for renal lithiasis. 2.  C sections x2 3.  1989:  BTL 4.   2007:  D & C and hysteroscopy--Dr. Clearance Coots.  Found to have an endometrial polyp and endometrial hyperplasia  Social History: Smoking Status:  current Packs/Day:  1 cigarette daily  Review of Systems General:  Energy okay.  Sluggish at times.  Denies daytime somnolence.  Does snore.  Awakens at night and  cannot get back to sleep.. Eyes:  glasses--Dr. Mitzi Davenport in past year for eye exam.. ENT:  Denies decreased hearing. CV:  Has had cardiac work up for dyspnea recently. Resp:  See HPI--PFTS suggest intermittent upper airway obstruction.Marland Kitchen GI:  Denies abdominal pain, bloody stools, constipation, dark tarry stools, and diarrhea; Stools are dark, however, as taking iron.. GU:  cramping with periods. MS:  Denies joint pain, joint redness, and joint swelling; Feet and ankles swell at times.  Does not have compression stockings--on feet a lot.. Derm:  pruritic rash on upper right arm--used a different lotion recently--1 week.. Neuro:  legs with numbness and tingling at times. Psych:  PHQ-9 score of 11--pt. denies feeling depressed--mainly related to sleep.Marland Kitchen  Physical Exam  General:  Obese,  hirsute, NAD Head:  Normocephalic and atraumatic without obvious abnormalities. No apparent alopecia or balding. Eyes:  No corneal or conjunctival inflammation noted. EOMI. Perrla. Funduscopic exam benign, without hemorrhages, exudates or papilledema. Vision grossly normal. Ears:  External ear exam shows no significant lesions or deformities.  Otoscopic examination reveals clear canals, tympanic membranes are intact bilaterally without bulging, retraction, inflammation or discharge. Hearing is grossly normal bilaterally. Nose:  External nasal examination shows no deformity or inflammation. Nasal mucosa are pink and moist without lesions or exudates. Mouth:  Oral mucosa and oropharynx without lesions or exudates.  fair dentition.   Neck:  No deformities, masses, or tenderness noted. Chest Wall:  No deformities, masses, or tenderness noted. Breasts:  No mass, nodules, thickening, tenderness, bulging, retraction, inflamation, nipple discharge or skin changes noted.   Lungs:  Normal respiratory effort, chest expands symmetrically. Lungs are clear to auscultation, no crackles or wheezes. Heart:  Normal rate and regular rhythm. S1 and S2 normal without gallop, murmur, click, rub or other extra sounds. Abdomen:  Bowel sounds positive,abdomen soft and non-tender without masses, organomegaly or hernias noted. Rectal:  No external abnormalities noted. Normal sphincter tone. No rectal masses or tenderness.  Heme negative dark green stool. Genitalia:  Pelvic Exam:        External: normal female genitalia without lesions or masses        Vagina: Thick light yellow discharge without odor.  No inflammation of mucosa normal without lesions or masses        Cervix: Extropion--friable about what appears to be os--difficulty getting cervix fully visible        Adnexa: normal bimanual exam without masses or fullness        Uterus: normal by palpation        Pap smear: performed Msk:  No deformity or scoliosis noted  of thoracic or lumbar spine.   Pulses:  R and L carotid,radial,femoral,dorsalis pedis and posterior tibial pulses are full and equal bilaterally Extremities:  No clubbing, cyanosis, edema, or deformity noted with normal full range of motion of all joints.   Neurologic:  No cranial nerve deficits noted. Station and gait are normal. Plantar reflexes are down-going bilaterally. DTRs are symmetrical throughout. Sensory, motor and coordinative functions appear intact. Skin:  Intact without suspicious lesions or rashes Cervical Nodes:  No lymphadenopathy noted Axillary Nodes:  No palpable lymphadenopathy Inguinal Nodes:  No significant adenopathy Psych:  Cognition and judgment appear intact. Alert and cooperative with normal attention span and concentration. No apparent delusions, illusions, hallucinations   Impression & Recommendations:  Problem # 1:  ROUTINE GYNECOLOGICAL EXAMINATION (ICD-V72.31) Mammogram scheduled for 10/24/09 Orders: KOH/ WET Mount [phone]) Pap Smear, Thin Prep ( Collection of) (G4010) UA Dipstick w/o Micro (  automated)  (81003) T- GC Chlamydia (16109) T-Pap Smear, Thin Prep (60454) T-HIV Antibody  (Reflex) ([id number]) T-Syphilis Test (RPR) ([id number])  Problem # 2:  Preventive Health Care (ICD-V70.0) Hemoccult x3 to return in 2 weeks.  Problem # 3:  SNORING (ICD-786.09) Encouraged weight loss Her updated medication list for this problem includes:    Carvedilol 6.25 Mg Tabs (Carvedilol) .Marland Kitchen... 1 tab by mouth two times a day  Orders: Sleep Disorder Referral (Sleep Disorder)--set up for sleep study  Problem # 4:  MENORRHAGIA, PERIMENOPAUSAL (ICD-626.8)  Orders: Gynecologic Referral (Gyn)--send to [facility] to see if something more definitive can be done--missing work secondary to heavy flow  Problem # 5:  DYSPNEA ON EXERTION (ICD-786.09) PFTS with possibility of variable airway obstruction--probably needs bronchoscopy--send to pulmonary Her updated  medication list for this problem includes:    Carvedilol 6.25 Mg Tabs (Carvedilol) .Marland Kitchen... 1 tab by mouth two times a day  Orders: Pulmonary Referral (Pulmonary)  Problem # 6:  MIXED HYPERLIPIDEMIA (ICD-272.2) Not controlled with last testing, but pt. most likely was not taking meds then--see note on FLP. Her updated medication list for this problem includes:    Pravastatin Sodium 40 Mg Tabs (Pravastatin sodium) .Marland Kitchen... 1 tab by mouth daily  Problem # 7:  DIABETES MELLITUS, TYPE II, UNCONTROLLED (ICD-250.02) Continues with poor control--address at follow up visit. Her updated medication list for this problem includes:    Amaryl 4 Mg Tabs (Glimepiride) ..... One tablet by mouth twice daily    Metformin Hcl 1000 Mg Tabs (Metformin hcl) ..... One tablet  by mouth twice daily    Aspir-low 81 Mg Tbec (Aspirin) .Marland Kitchen... 1 tab by mouth daily  Orders: UA Dipstick w/o Micro (automated)  (81003) Hgb A1C ([id number])  Complete Medication List: 1)  Amaryl 4 Mg Tabs (Glimepiride) .... One tablet by mouth twice daily 2)  Metformin Hcl 1000 Mg Tabs (Metformin hcl) .... One tablet  by mouth twice daily 3)  Glucometer Elite Classic Kit (Blood glucose monitoring suppl) .... Two times a day blood sugar checks 4)  Glucometer Elite Test Strp (Glucose blood) .... Two times a day sugar checks 5)  Ibuprofen 800 Mg Tabs (Ibuprofen) .Marland Kitchen.. 1 tab by mouth two times a day 6)  Aspir-low 81 Mg Tbec (Aspirin) .Marland Kitchen.. 1 tab by mouth daily 7)  Carvedilol 6.25 Mg Tabs (Carvedilol) .Marland Kitchen.. 1 tab by mouth two times a day 8)  Pravastatin Sodium 40 Mg Tabs (Pravastatin sodium) .Marland Kitchen.. 1 tab by mouth daily 9)  Nitrostat 0.4 Mg Subl (Nitroglycerin) .Marland Kitchen.. 1 sublingual as needed chest discomfort.  may repeat every 5 minutes x2 if chest discomfort not relieved--lie down after taking--with head elevated 10)  Ferrous Sulfate 325 (65 Fe) Mg Tabs (Ferrous sulfate) .Marland Kitchen.. 1 tab by mouth daily  Other Orders: Capillary Blood Glucose/CBG  (29528)  Patient Instructions: 1)  Follow up with Dr. Delrae Alfred in 3 months --DM, dyspnea 2)  FLP in 3 months--2 days before above appt.  FLP, CMET   Preventive Care Screening  Prior Values:    Last Tetanus Booster:  Tdap (06/07/2009)    Last Flu Shot:  Fluvax 3+ (06/07/2009)    Last Pneumovax:  Historical (11/24/2005)     Mammogram:  cannot recall, but has an appt. for one next week. SBE:  Twice weekly--no changes Last Pap:  2007--Dr. Jean Rosenthal. LMP:  09/20/09--monthly still.  Very heavy with cramping.  Guaiac Cards: never Colonoscopy:  never Osteoprevention:  Not much dairy.  Using Lactaid.  No regular exercise--walks  a block.   Laboratory Results   Urine Tests    Routine Urinalysis   Color: lt. yellow Appearance: Clear Glucose: >=1000   (Normal Range: Negative) Bilirubin: negative   (Normal Range: Negative) Ketone: negative   (Normal Range: Negative) Spec. Gravity: >=1.030   (Normal Range: 1.003-1.035) Blood: negative   (Normal Range: Negative) pH: 5.0   (Normal Range: 5.0-8.0) Protein: negative   (Normal Range: Negative) Urobilinogen: 0.2   (Normal Range: 0-1) Nitrite: negative   (Normal Range: Negative) Leukocyte Esterace: negative   (Normal Range: Negative)     Blood Tests     HGBA1C: 10.7%   (Normal Range: Non-Diabetic - 3-6%   Control Diabetic - 6-8%) CBG Random:: 214mg /dL    Wet Mount Source: vaginal WBC/hpf: 10-20 Bacteria/hpf: 1+ Clue cells/hpf: none  Negative whiff Yeast/hpf: none Wet Mount KOH: Negative Trichomonas/hpf: none    Laboratory Results   Urine Tests    Routine Urinalysis   Color: lt. yellow Appearance: Clear Glucose: >=1000   (Normal Range: Negative) Bilirubin: negative   (Normal Range: Negative) Ketone: negative   (Normal Range: Negative) Spec. Gravity: >=1.030   (Normal Range: 1.003-1.035) Blood: negative   (Normal Range: Negative) pH: 5.0   (Normal Range: 5.0-8.0) Protein: negative   (Normal Range:  Negative) Urobilinogen: 0.2   (Normal Range: 0-1) Nitrite: negative   (Normal Range: Negative) Leukocyte Esterace: negative   (Normal Range: Negative)     Blood Tests     HGBA1C: 10.7%   (Normal Range: Non-Diabetic - 3-6%   Control Diabetic - 6-8%) CBG Random:: 214    Wet Mount/KOH  Negative whiff

## 2010-08-26 NOTE — Progress Notes (Signed)
Summary: Office Visit//DEPRESSION SCREENING  Office Visit//DEPRESSION SCREENING   Imported By: Arta Bruce 12/26/2009 11:30:11  _____________________________________________________________________  External Attachment:    Type:   Image     Comment:   External Document

## 2010-08-26 NOTE — Progress Notes (Signed)
   Request from DDS recieved sent to [facility]  Dec 04, 2009 9:07 AM

## 2010-08-26 NOTE — Assessment & Plan Note (Signed)
Summary: Pulmonary/ new pt eval   Visit Type:  Initial Consult Copy to:  Dr. Delrae Alfred Primary Provider/Referring Provider:  Julieanne Manson MD  CC:  Dyspnea.  History of Present Illness: 45 yobf smoker with moderate obesity complicated by dm and hyperlipidemia  Nov 28, 2009 cc paroxyms of sob x 1 year occur daily no pattern assoc with loss of voice esp talking on phone. PFT's suggested vcd/ not responding to saba.  Pt denies any significant sore throat, dysphagia, itching, sneezing,  nasal congestion or excess secretions,  fever, chills, sweats, unintended wt loss, pleuritic or exertional cp, hempoptysis, change in activity tolerance  orthopnea pnd or leg swelling Pt also denies any obvious fluctuation in symptoms with weather or environmental change or other alleviating or aggravating factors.       Current Medications (verified): 1)  Amaryl 4 Mg  Tabs (Glimepiride) .... One Tablet By Mouth Twice Daily 2)  Metformin Hcl 1000 Mg  Tabs (Metformin Hcl) .... One Tablet  By Mouth Twice Daily 3)  Glucometer Elite Classic   Kit (Blood Glucose Monitoring Suppl) .... Two Times A Day Blood Sugar Checks 4)  Glucometer Elite Test   Strp (Glucose Blood) .... Two Times A Day Sugar Checks 5)  Aspir-Low 81 Mg Tbec (Aspirin) .Marland Kitchen.. 1 Tab By Mouth Daily 6)  Carvedilol 6.25 Mg Tabs (Carvedilol) .Marland Kitchen.. 1 Tab By Mouth Two Times A Day 7)  Pravastatin Sodium 40 Mg Tabs (Pravastatin Sodium) .Marland Kitchen.. 1 Tab By Mouth Daily 8)  Ferrous Sulfate 325 (65 Fe) Mg Tabs (Ferrous Sulfate) .Marland Kitchen.. 1 Tab By Mouth Daily 9)  Lipitor (? Strength) .Marland Kitchen.. 1 At Bedtime 10)  Ibuprofen 800 Mg Tabs (Ibuprofen) .Marland Kitchen.. 1 Tab By Mouth Two Times A Day 11)  Nitrostat 0.4 Mg Subl (Nitroglycerin) .Marland Kitchen.. 1 Sublingual As Needed Chest Discomfort.  May Repeat Every 5 Minutes X2 If Chest Discomfort Not Relieved--Lie Down After Taking--With Head Elevated  Allergies (verified): 1)  ! Oxycodone-Acetaminophen (Oxycodone-Acetaminophen)  Past History:  Past  Medical History: 1. Diabetes mellitus type II 2. Obesity 3.  Low back pain 4.  Hyperlipidemia 5.  Exertional dyspnea: Suspect due to obesity and deconditioning.  Echo showed mild LVH, EF 60-65%, no significant diastolic dysfunction.  6.  Atypical chest pain: ETT-myoview with EF 71%, no ischemia or infarction, vigorous heart rate response suggestive of deconditioning.   7. VCD      - PFT's 126/2011 classic insp truncation typical of vcd  Family History: Reviewed history from 06/26/2009 and no changes required. Mother, died 16, 3 days after giving birth to pt--not sure what cause of death was.  Mother was also adopted. Father, Knows nothing about him.   No siblings Daughter, 47:  healthy Son, 21:  asthma. No known premature coronary disease.   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 Current smoker.  Smoked "a few" cigs per day.  Started at age 25.  Review of Systems       The patient complains of shortness of breath with activity and shortness of breath at rest.  The patient denies productive cough, non-productive cough, coughing up blood, chest pain, irregular heartbeats, acid heartburn, indigestion, loss of appetite, weight change, abdominal pain, difficulty swallowing, sore throat, tooth/dental problems, headaches, nasal congestion/difficulty breathing through nose, sneezing, itching, ear ache, anxiety, depression, hand/feet swelling, joint stiffness or pain, rash, change in color of mucus, and fever.    Vital  Signs:  Patient profile:   45 year old female Menstrual status:  irregular Weight:      202 pounds O2 Sat:      97 % on Room air Temp:     98.2 degrees F oral Pulse rate:   82 / minute BP sitting:   104 / 60  (left arm)  Vitals Entered By: Vernie Murders (Nov 28, 2009 11:07 AM)  O2 Flow:  Room air  Physical Exam  Additional Exam:  amb bf with classic  voice fatigue and pseudowheeze resolves with purse lip maneuver  wt 205 > 202 Nov 28, 2009  HEENT: nl dentition, turbinates, and orophanx. Nl external ear canals without cough reflex NECK :  without JVD/Nodes/TM/ nl carotid upstrokes bilaterally LUNGS: no acc muscle use, clear to A and P bilaterally without cough on insp or exp maneuvers CV:  RRR  no s3 or murmur or increase in P2, no edema  ABD:  soft and nontender with nl excursion in the supine position. No bruits or organomegaly, bowel sounds nl MS:  warm without deformities, calf tenderness, cyanosis or clubbing SKIN: warm and dry without lesions   NEURO:  alert, approp, no deficits     Impression & Recommendations:  Problem # 1:  DYSPNEA (ICD-786.09)  DDX of  difficult airways managment all start with A and  include Adherence, Ace Inhibitors, Acid Reflux, Active Sinus Disease, Alpha 1 Antitripsin deficiency, Anxiety masquerading as Airways dz,  ABPA,  allergy(esp in young), Aspiration (esp in elderly), Adverse effects of DPI,  Active smokers, plus one B  = Beta blocker use..  the latter doesn't apply here because she's not really taking her coreg at all  Anxiety and acid reflux probably the greatest concern here   Each maintenance medication was reviewed in detail including most importantly the difference between maintenance and as needed and under what circumstances the prns are to be used. See instructions for specific recommendations   Orders: New Patient Level IV (16109)  Medications Added to Medication List This Visit: 1)  Lipitor (? Strength)  .Marland Kitchen.. 1 at bedtime 2)  Dexilant 60 Mg Cpdr (Dexlansoprazole) .... Take  one 30-60 min before first meal of the day  Patient Instructions: 1)  ok to leave off corevidol 2)  committ to quit smoking 3)  dexilant 60 Take  one 30-60 min before first meal of the day 4)  GERD (REFLUX)  is a common cause of respiratory symptoms. It commonly presents without heartburn and can be treated  with medication, but also with lifestyle changes including avoidance of late meals, excessive alcohol, smoking cessation, and avoid fatty foods, chocolate, peppermint, colas, red wine, and acidic juices such as orange juice. NO MINT OR MENTHOL PRODUCTS SO NO COUGH DROPS  5)  USE SUGARLESS CANDY INSTEAD (jolley ranchers)  6)  NO OIL BASED VITAMINS  7)  Please schedule a follow-up appointment in 4 weeks, sooner if needed

## 2010-08-26 NOTE — Assessment & Plan Note (Signed)
Summary: FU ON CHEST PAINS////KT   Vital Signs:  Patient profile:   45 year old female Menstrual status:  irregular Weight:      202.8 pounds Temp:     97.9 degrees F Pulse rate:   71 / minute Pulse rhythm:   regular Resp:     18 per minute BP sitting:   114 / 79  (left arm) Cuff size:   large  Vitals Entered By: Vesta Mixer CMA (August 16, 2009 9:33 AM) CC: f/u chest pains, still there some, but not as bad. Is Patient Diabetic? Yes Pain Assessment Patient in pain? yes     Location: back/legs Intensity: 3 CBG Result 286  Does patient need assistance? Ambulation Normal   Primary Care Provider:  Julieanne Manson MD  CC:  f/u chest pains, still there some, and but not as bad..  History of Present Illness: 1.  Shortness of breath:  Cardiac as per Dr. Shirlee Latch negative.  Appears to have deconditioning with increase HR with exertion on ETT.  Cardiolite negative for ischemia.  Echo showed LVH, but otherwise normal.  Has to stop and sit down at work frequently because of dyspnea.  Can be at work just speaking with someone and begins breathing hard--sits down and is asked to leave--cutting into pay. Sits for 5 minutes and is fine.  Can work for an hour before dyspnea starts up again.  Cannot say feels significantly stressed when occurs.  Can be speaking with family members when starts up as well.  Did not get CXR as ordered back in November.  Does snore, but again, pt. denies daytime somnolence--poor sleep however, and poor sleep hygiene.    2.  Periods stopping and starting:  Has never been regular.  LMP 06/19/09.  No period in December.  Having on and off spotting this month.  Has skipped periods before.  Having night sweats and hot flashes past few months.  Discussed not a good candidate for HRT.    3.  Hyperlipidemia:  Did eat this morning.  4.  Tobacco abuse:  stopped 1 month ago.  Habits & Providers  Alcohol-Tobacco-Diet     Alcohol drinks/day: <1     Tobacco Status:  quit < 6 months     Cigarette Packs/Day: 4 cigarettes     Year Started: age 37  Exercise-Depression-Behavior     Drug Use: never  Allergies (verified): 1)  ! 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 Lungs are clear to auscultation, no crackles or wheezes. Heart:  Normal rate and regular rhythm. S1 and S2 normal without gallop, murmur, click, rub or other extra sounds.  Radial pulses normal and equal Abdomen:  Bowel sounds positive,abdomen soft and non-tender without masses, organomegaly or hernias noted.  No suprapubic mass or tenderness   Impression & Recommendations:  Problem # 1:  DYSPNEA ON EXERTION (ICD-786.09)  Not clear why she is having so much difficulty with this Needs to keep possibility of OSA and anxiety in mind Pt. did not get CXR as ordered--send for that PFTs as well. Her updated medication list for this problem includes:    Carvedilol 6.25 Mg Tabs (Carvedilol) .Marland Kitchen... 1 tab by mouth two times a day  Orders: Misc. Referral (Misc.  Ref) Diagnostic X-Ray/Fluoroscopy (Diagnostic X-Ray/Flu)  Problem # 2:  MENORRHAGIA, PERIMENOPAUSAL (ICD-626.8) Pt. is perimenopausal. Follow for now--this has been a longstanding problem--hx of admission for heavy irregular bleeding in past Not a good candidate for HRT  Complete Medication List: 1)  Amaryl 4 Mg Tabs (Glimepiride) .... One tablet by mouth twice daily 2)  Metformin Hcl 1000 Mg Tabs (Metformin hcl) .... One tablet  by mouth twice daily 3)  Glucometer Elite Classic Kit (Blood glucose monitoring suppl) .... Two times a day blood sugar checks 4)  Glucometer Elite Test Strp (Glucose blood) .... Two  times a day sugar checks 5)  Ibuprofen 800 Mg Tabs (Ibuprofen) .Marland Kitchen.. 1 tab by mouth two times a day 6)  Aspir-low 81 Mg Tbec (Aspirin) .Marland Kitchen.. 1 tab by mouth daily 7)  Carvedilol 6.25 Mg Tabs (Carvedilol) .Marland Kitchen.. 1 tab by mouth two times a day 8)  Pravastatin Sodium 40 Mg Tabs (Pravastatin sodium) .Marland Kitchen.. 1 tab by mouth daily 9)  Nitrostat 0.4 Mg Subl (Nitroglycerin) .Marland Kitchen.. 1 sublingual as needed chest discomfort.  may repeat every 5 minutes x2 if chest discomfort not relieved--lie down after taking--with head elevated 10)  Ferrous Sulfate 325 (65 Fe) Mg Tabs (Ferrous sulfate) .Marland Kitchen.. 1 tab by mouth daily  Other Orders: Capillary Blood Glucose/CBG (96295)  Patient Instructions: 1)  CPP next available with Jiovanna Frei 2)  Follow up with Dr. Delrae Alfred in 2 months --dyspnea 3)  Schedule FLP, Liver profile in next 2 weeks--lab only   Tetanus/Td Immunization History:    Tetanus/Td # 1:  Tdap (06/07/2009)  Influenza Immunization History:    Influenza # 1:  Fluvax 3+ (06/07/2009)  Pneumovax Immunization History:    Pneumovax # 1:  Historical (11/24/2005)

## 2010-08-26 NOTE — Letter (Signed)
Summary: APPT //PT AWARE  APPT //PT AWARE   Imported By: Arta Bruce 01/09/2010 12:24:37  _____________________________________________________________________  External Attachment:    Type:   Image     Comment:   External Document

## 2010-08-26 NOTE — Assessment & Plan Note (Signed)
Summary: Pulmonary/ f/u doe 30% better with walking sats = ok   Copy to:  Dr. Delrae Alfred Primary Provider/Referring Provider:  Julieanne Manson MD  CC:  4 wk followup.  Pt states that her breathing is approx 30% improved.  She states that she feels she is able to do a little more activity without getting out of breath. She states "burning in throat" has resolved. No new complaints. .  History of Present Illness: 66 yobf light smoker quit 11/2009 with moderate obesity complicated by dm and hyperlipidemia  Nov 28, 2009 cc paroxyms of sob x 1 year occur daily no pattern assoc with loss of voice esp talking on phone. PFT's suggested vcd/ not responding to saba.  ok to leave off corevidol committ to quit smoking dexilant 60 Take  one 30-60 min before first meal of the day   January 15, 2010 ov 4 wk followup.  Pt states that her breathing is approx 30% improved.  She states that she feels she is able to do a little more activity without getting out of breath. She states "burning in throat" has resolved. No new complaints.  walks to bus stop 5 days  a week 3 blocks without stopping but fm doesn't want her shopping anymore becasue she's so sob.  Pt denies any significant sore throat, dysphagia, itching, sneezing,  nasal congestion or excess secretions,  fever, chills, sweats, unintended wt loss, pleuritic or exertional cp, hempoptysis, change in activity tolerance  orthopnea pnd or leg swelling. Pt also denies any obvious fluctuation in symptoms with weather or environmental change or other alleviating or aggravating factors.       Current Medications (verified): 1)  Amaryl 4 Mg  Tabs (Glimepiride) .... One Tablet By Mouth Twice Daily 2)  Metformin Hcl 1000 Mg  Tabs (Metformin Hcl) .... One Tablet  By Mouth Twice Daily 3)  Glucometer Elite Classic   Kit (Blood Glucose Monitoring Suppl) .... Two Times A Day Blood Sugar Checks 4)  Glucometer Elite Test   Strp (Glucose Blood) .... Two Times A Day Sugar  Checks 5)  Aspir-Low 81 Mg Tbec (Aspirin) .Marland Kitchen.. 1 Tab By Mouth Daily 6)  Pravastatin Sodium 40 Mg Tabs (Pravastatin Sodium) .Marland Kitchen.. 1 Tab By Mouth Daily 7)  Ferrous Sulfate 325 (65 Fe) Mg Tabs (Ferrous Sulfate) .Marland Kitchen.. 1 Tab By Mouth Daily 8)  Lipitor 40 Mg Tabs (Atorvastatin Calcium) .Marland Kitchen.. 1 Once Daily 9)  Ibuprofen 800 Mg Tabs (Ibuprofen) .Marland Kitchen.. 1 Tab By Mouth Two Times A Day 10)  Nitrostat 0.4 Mg Subl (Nitroglycerin) .Marland Kitchen.. 1 Sublingual As Needed Chest Discomfort.  May Repeat Every 5 Minutes X2 If Chest Discomfort Not Relieved--Lie Down After Taking--With Head Elevated 11)  Dexilant 60 Mg Cpdr (Dexlansoprazole) .... Take  One 30-60 Min Before First Meal of The Day  Allergies (verified): 1)  ! Oxycodone-Acetaminophen (Oxycodone-Acetaminophen)  Past History:  Past Medical History: 1. Diabetes mellitus type II 2. Obesity/deconditions 3.  Low back pain 4.  Hyperlipidemia 5.  Exertional dyspnea: Suspect due to obesity and deconditioning.  Echo showed mild LVH, EF 60-65%, no significant diastolic dysfunction.  6.  Atypical chest pain: ETT-myoview with EF 71%, no ischemia or infarction, vigorous heart rate response suggestive of deconditioning.   7. VCD      - PFT's 126/2011 classic insp truncation typical of vcd      - No desats walking x 3 laps @ 138ft each January 15, 2010   Vital Signs:  Patient profile:   45 year old  female Menstrual status:  irregular Weight:      201 pounds O2 Sat:      96 % on Room air Temp:     99.2 degrees F oral Pulse rate:   94 / minute BP sitting:   100 / 60  (left arm)  Vitals Entered By: Vernie Murders (January 15, 2010 3:59 PM)  O2 Flow:  Room air  Serial Vital Signs/Assessments:  Comments: 4:37 PM Ambulatory Pulse Oximetry  Resting; HR__101___    02 Sat__98%ra___  Lap1 (185 feet)   HR__112___   02 Sat__97%ra___ Lap2 (185 feet)   HR__114___   02 Sat__97%ra___    Lap3 (185 feet)   HR__118___   02 Sat__97%ra___  _x__Test Completed without  Difficulty ___Test Stopped due to:   By: Vernie Murders    Physical Exam  Additional Exam:  amb bf  no longer  classic voice fatigue or pseudowheeze  wt 205 > 202 Nov 28, 2009 > 201 January 15, 2010  HEENT: nl dentition, turbinates, and orophanx. Nl external ear canals without cough reflex NECK :  without JVD/Nodes/TM/ nl carotid upstrokes bilaterally LUNGS: no acc muscle use, clear to A and P bilaterally without cough on insp or exp maneuvers CV:  RRR  no s3 or murmur or increase in P2, no edema  ABD:  soft and nontender with nl excursion in the supine position. No bruits or organomegaly, bowel sounds nl MS:  warm without deformities, calf tenderness, cyanosis or clubbing      Impression & Recommendations:  Problem # 1:  DYSPNEA (ICD-786.09)  She has had two different patterns, the first paroxysmal at rest, probl related to vcd from gerd, the other reproducible with exertion and most likely obesity/ deconditioning.  Both appear to be improving on rx  I had an extended discussion with the patient today lasting 15 to 20 minutes of a 25 minute visit on the following issues:   Weight control is a matter of calorie balance which needs to be tilted in the pt's favor by eating less and exercising more.  Specifically, I recommended  exercise at a level where pt  is short of breath but not out of breath 30 minutes daily.  If not losing weight on this program, I would strongly recommend pt see a nutritionist with a food diary recorded for two weeks prior to the visit.     Orders: Est. Patient Level III (14782) Pulse Oximetry, Ambulatory (95621)  Medications Added to Medication List This Visit: 1)  Lipitor 40 Mg Tabs (Atorvastatin calcium) .Marland Kitchen.. 1 once daily 2)  Prilosec Otc 20 Mg Tbec (Omeprazole magnesium) .... Take  one 30-60 min before first meal of the day  Patient Instructions: 1)  Weight control is simply a matter of calorie balance which needs to be tilted in your favor by eating  less and exercising more.  To get the most out of exercise, you need to be continuously aware that you are short of breath, but never out of breath, for 30 minutes daily. As you improve, it will actually be easier for you to do the same amount in  30 minutes so always push to the level where you are short of breath.  If this does not result in gradual weight reduction,  I recommend  a nutritionist for a food diary 2)  Return to office in 3 months, sooner if needed

## 2010-08-26 NOTE — Letter (Signed)
Summary: [facility]   [place]   Imported By: Arta Bruce 12/26/2009 11:32:18  _____________________________________________________________________  External Attachment:    Type:   Image     Comment:   External Document

## 2010-08-26 NOTE — Letter (Signed)
Summary: NUTRITIONAL & DIABETES MANAGEMENT//NO SHOWED  NUTRITIONAL & DIABETES MANAGEMENT//NO SHOWED   Imported By: Arta Bruce 12/17/2009 14:39:19  _____________________________________________________________________  External Attachment:    Type:   Image     Comment:   External Document

## 2010-08-26 NOTE — Letter (Signed)
Summary: REFERRAL//NUTRITION   REFERRAL//NUTRITION   Imported By: Arta Bruce 09/11/2009 14:51:06  _____________________________________________________________________  External Attachment:    Type:   Image     Comment:   External Document

## 2010-08-26 NOTE — Assessment & Plan Note (Addendum)
Summary: Tobacco Cessation / PFT - Rx Clinic   Vital Signs:  Patient profile:   45 year old female Height:      61 inches Weight:      146.4 pounds BMI:     27.76 Pulse rate:   98 / minute BP sitting:   123 / 85  (right arm)  Primary Care Copper Basnett:  Renold Don MD   History of Present Illness: 45 Kristy [name] with complaints of dyspnea on exertion, SOB with night awakenings at previous clinic visit.  Plan was to rule out any cardiac etiology vs asthma/COPD causing DOE and SOB.   She presented today for PFTs and smoking cessation counseling.    Habits & Providers  Alcohol-Tobacco-Diet     Tobacco Status: current     Pack years: 8.75  Current Medications (verified): 1)  Ferrous Sulfate 325 (65 Fe) Mg  Tbec (Ferrous Sulfate) .... Take 1 Once A Day Until Instructed To Stop. 2)  Hydrochlorothiazide 25 Mg  Tabs (Hydrochlorothiazide) .... Take 1 Tab By Mouth Every Morning 3)  Oxycodone Hcl 5 Mg  Caps (Oxycodone Hcl) .... Take 1-2 Tablets Q 6 Hours  As Needed For Severe Pain 4)  Methotrexate 2.5 Mg Tabs (Methotrexate Sodium) .... Take 8 Tabs Weekly 5)  Tylenol 325 Mg Tabs (Acetaminophen) .... Take 2 Tabs By Mouth Two Times A Day As Needed 6)  Enbrel  Unknown Dose .... Administer Per Rheumatology Instructions 7)  Diclofenac Sodium 75 Mg Tbec (Diclofenac Sodium) .Marland Kitchen.. 1 Tab By Mouth Two Times A Day For Pain 8)  Symbyax 6-50 Mg Caps (Olanzapine-Fluoxetine Hcl) .Marland Kitchen.. 1 Tab By Mouth Daily 9)  Miralax  Powd (Polyethylene Glycol 3350) .Marland Kitchen.. 1 Capful in 8 Ozs Fluid By Mouth Daily For Constipation.  Dispense 1 Large Bottle, Generic 10)  Deplin 15 Mg Tabs (L-Methylfolate) .Marland Kitchen.. 1 Tab By Mouth in The Morning and One At Bedtime (Folic Acid) 11)  Protonix 40 Mg Tbec (Pantoprazole Sodium) .Marland Kitchen.. 1 Tab By Mouth Daily 12)  Calcium Carbonate-Vitamin D 600-400 Mg-Unit  Tabs (Calcium Carbonate-Vitamin D) .... 1/2 Once Daily 13)  Ambien 10 Mg Tabs (Zolpidem Tartrate) .... Take One Just Before Bed.  [facility] Kath[facility]nn [facility]. 14)  Nicorette 2 Mg Gum (Nicotine Polacrilex) .... Use Up To 8 Pieces Per Day.  Dispense One Box 15)  Alli 60 Mg Caps (Orlistat) .... 2 Per Day 16)  Cytomel 25 Mcg Tabs (Liothyronine Sodium) .... Take One Daily - in The Morning.  Per Dr. Kathrynn Running [facility]. 17)  Ventolin Hfa 108 (90 Base) Mcg/act Aers (Albuterol Sulfate) .... Inhale 2 Puffs Every 4-6 Hours As Needed For Shortness of Breath  Allergies (verified): 1)  Codeine   Impression & Recommendations:  Problem # 1:  DYSPNEA (ICD-786.05)  Spirometry evaluation with mild restriction pre bronchodilator; did not attempted post bronchodilator.  Patient has been experiencing dyspnea with night awakenings and was provided with prn albuterol at previous clinic visit for symptom relief for which she has been using on average 2-3 times per day.  She is a smoker of  ~47 years and is considering smoking cessation.  Continue current treatment plan at this time.  Reviewed results of pulmonary function tests.  Pt verbalized understanding of results. Will recheck PFTs 6 months to 1 year post smoking cessation.  Written pt instructions provided.  F/U Rx Clinic Visit within one month.  TTFFC:  30   minutes.  Patient seen with: Geoffry Paradise, PharmD,  Harrel Carina,  PharmD candidate.  Orders: PFT Baseline (PFT Baseline)  Problem # 2:  NICOTINE ADDICTION (ICD-305.1)  Moderate Nicotine abuse of 47 years duration in a patient who is fair candidate for success b/c of stress and inability to cut back smoking habit post meals.  She smoked at most less than 1 pack per day in the past.  Currently she is smoking [employer] 1/2 a pack per day.  Patient counseled on the needs and benefit of smoking cesation; especially with results of PFTs showing mild restriction.  Patient expressed interest in smoking cessation and will  F/U Rx Clinic visit within 1 month to set concrete smoking cessation plans.  She attempted Nicorette gum in  the past but experienced GI side effects; possibly secondary to inappropriate technique.  Will consider restarting gum at next visit.  Total time with patient in face-to-face counseling:  30  minutes.  Patient seen with: Geoffry Paradise, PharmD,  Harrel Carina, PharmD candidate.   Her updated medication list for this problem includes:    Nicorette 2 Mg Gum (Nicotine polacrilex) ..... Use up to 8 pieces per day.  dispense one box  Orders: PFT Baseline (PFT Baseline)  Complete Medication List: 1)  Ferrous Sulfate 325 (65 Fe) Mg Tbec (Ferrous sulfate) .... Take 1 once a day until instructed to stop. 2)  Hydrochlorothiazide 25 Mg Tabs (Hydrochlorothiazide) .... Take 1 tab by mouth every morning 3)  Oxycodone Hcl 5 Mg Caps (Oxycodone hcl) .... Take 1-2 tablets q 6 hours  as needed for severe pain 4)  Methotrexate 2.5 Mg Tabs (Methotrexate sodium) .... Take 8 tabs weekly 5)  Tylenol 325 Mg Tabs (Acetaminophen) .... Take 2 tabs by mouth two times a day as needed 6)  Enbrel Unknown Dose  .... Administer per rheumatology instructions 7)  Diclofenac Sodium 75 Mg Tbec (Diclofenac sodium) .Marland Kitchen.. 1 tab by mouth two times a day for pain 8)  Symbyax 6-50 Mg Caps (Olanzapine-fluoxetine hcl) .Marland Kitchen.. 1 tab by mouth daily 9)  Miralax Powd (Polyethylene glycol 3350) .Marland Kitchen.. 1 capful in 8 ozs fluid by mouth daily for constipation.  dispense 1 large bottle, generic 10)  Deplin 15 Mg Tabs (L-methylfolate) .Marland Kitchen.. 1 tab by mouth in the morning and one at bedtime (folic acid) 11)  Protonix 40 Mg Tbec (Pantoprazole sodium) .Marland Kitchen.. 1 tab by mouth daily 12)  Calcium Carbonate-vitamin D 600-400 Mg-unit Tabs (Calcium carbonate-vitamin d) .... 1/2 once daily 13)  Ambien 10 Mg Tabs (Zolpidem tartrate) .... Take one just before bed.  per dr. Kathrynn Running [facility]. 14)  Nicorette 2 Mg Gum (Nicotine polacrilex) .... Use up to 8 pieces per day.  dispense one box 15)  Alli 60 Mg Caps (Orlistat) .... 2 per day 16)  Cytomel 25 Mcg  Tabs (Liothyronine sodium) .... Take one daily - in the morning.  per dr. Kathrynn Running in [facility]. 17)  Ventolin Hfa 108 (90 Base) Mcg/act Aers (Albuterol sulfate) .... Inhale 2 puffs every 4-6 hours as needed for shortness of breath   Pulmonary Function Test Date: 05/09/2010 Height (in.): 61 Gender: Female  Pre-Spirometry FVC    Value: 1.84 L/min   Pred: 2.47 L/min     % Pred: 74 % FEV1    Value: 1.47 L     Pred: 2.08 L     % Pred: 70 % FEV1/FVC  Value: 80 %     Pred: 87 %     % Pred: 92 % FEF 25-75  Value: 1.37 L/min  Pred: 2.40 L/min     % Pred: 57 %  Comments: Effort: FAIR  Mild Restriction   Tobacco Counseling   Currently uses tobacco.    Cigarettes      Year started smoking cigarettes:      1976     Number of packs per day smoked:      0.25     Years smoked:            35     Packs per year:          91.25     Pack Years:            8.75  Cessation Stage:     Action Target Quit Date:     06/11/2010  Quitting Barriers:      -  stress     -  anxiety  Quitting Motivators:      -  poor health  Comments: Most ever smoked just less than ONE pack per day.  Pain of RA and SS dz is trigger.   Anxiety is also a trigger.  Brand [employer] LIGHTs recently switched from [employer]    Attempted GUM developed GI upset,  May consider use again with new technique - consider at next visit.   Previous Quit Attempts   Previously Tried to quit:     Yes # of Previous quit attempts:     1 Longest Successful Quit Period:   2 years   Quit Methods Tried:      -  cold [place]  Reason for restarting:      -  feels addicted     -  other  Comments: to quit use of tobacco products  Readiness:     Somewhat Ready Cessation Stage:   Action Target Quit Date:   06/11/2010  Counseled on:      -  medication use/compliance  Orders Added this Update:  PFT Baseline [PFT Baseline]   Prevention & Chronic Care Immunizations   Influenza vaccine: Fluvax 3+   (05/01/2009)   Influenza vaccine due: 05/03/2009    Tetanus booster: Not documented    Pneumococcal vaccine: Not documented  Other Screening   Pap smear: Specimen Adequacy: Satisfactory for evaluation.   Interpretation/Result:Negative for intraepithelial Lesion or Malignancy.     (12/16/2009)   Pap smear action/deferral: Ordered  (03/26/2009)   Pap smear due: 06/2010    Mammogram: ASSESSMENT: Negative - BI-RADS 1^MM DIGITAL SCREENING  (02/22/2009)   Mammogram action/deferral: Ordered  (02/21/2009)   Mammogram due: 02/22/2010   Smoking status: current  (05/09/2010)   Smoking cessation counseling: yes  (03/18/2010)   Target quit date: 06/11/2010  (05/09/2010)  Lipids   Total Cholesterol: 182  (02/21/2009)   Lipid panel action/deferral: Lipid Panel ordered   LDL: 100  (02/21/2009)   LDL Direct: Not documented   HDL: 64  (02/21/2009)   Triglycerides: 89  (02/21/2009)  Hypertension   Last Blood Pressure: 123 / 85  (05/09/2010)   Serum creatinine: 0.73  (03/13/2010)   Serum potassium 3.6  (03/13/2010)    Hypertension flowsheet reviewed?: Yes   Progress toward BP goal: At goal  Self-Management Support :   Personal Goals (by the next clinic visit) :      Personal blood pressure goal: 140/90  (10/08/2009)   Hypertension self-management support: Written self-care plan  (03/26/2009)    Hypertension self-management support not done because: Good outcomes  (01/21/2010)

## 2010-08-26 NOTE — Letter (Signed)
[facility] KEEP APPT  [facility]PT   Imported By: Arta Bruce 02/13/2010 15:27:31  _____________________________________________________________________  External Attachment:    Type:   Image     Comment:   External Document

## 2010-08-26 NOTE — Assessment & Plan Note (Signed)
Summary: 2 WEEK FU FOR BP CHECK///KT  Nurse Visit   Vital Signs:  Patient profile:   45 year old female Menstrual status:  irregular Pulse rate:   84 / minute Pulse rhythm:   regular Resp:     20 per minute BP sitting:   112 / 80  (left arm) Cuff size:   large  Vitals Entered By: Dutch Quint RN (May 13, 2010 9:49 AM)  Impression & Recommendations:  Problem # 1:  DIABETES MELLITUS, TYPE II, UNCONTROLLED (ICD-250.02) Seen by Jesse Fall  Blood pressure is good F/u with Dr. Delrae Alfred in four weeks to review DM meds. Last HbA1C was 11.2 -- uncontrolled CBG today was 197.   Her updated medication list for this problem includes:    Amaryl 4 Mg Tabs (Glimepiride) ..... One tablet by mouth twice daily    Metformin Hcl 1000 Mg Tabs (Metformin hcl) ..... One tablet  by mouth twice daily    Aspir-low 81 Mg Tbec (Aspirin) .Marland Kitchen... 1 tab by mouth daily    Actos 45 Mg Tabs (Pioglitazone hcl) .Marland Kitchen... 1 tab by mouth daily  Problem # 2:  FOLLICULITIS (ICD-704.8)  Uncontrolled DM is most likely why rash is not healing Doxycycline 100  mg. by mouth two times a day x10 days   Orders: Est. Patient Level II (82956)  Complete Medication List: 1)  Amaryl 4 Mg Tabs (Glimepiride) .... One tablet by mouth twice daily 2)  Metformin Hcl 1000 Mg Tabs (Metformin hcl) .... One tablet  by mouth twice daily 3)  Glucometer Elite Classic Kit (Blood glucose monitoring suppl) .... Two times a day blood sugar checks 4)  Glucometer Elite Test Strp (Glucose blood) .... Two times a day sugar checks 5)  Aspir-low 81 Mg Tbec (Aspirin) .Marland Kitchen.. 1 tab by mouth daily 6)  Ferrous Sulfate 325 (65 Fe) Mg Tabs (Ferrous sulfate) .Marland Kitchen.. 1 tab by mouth daily 7)  Lipitor 40 Mg Tabs (Atorvastatin calcium) .... Take 1 tab by mouth at bedtime for cholesterol 8)  Nitrostat 0.4 Mg Subl (Nitroglycerin) .Marland Kitchen.. 1 sublingual as needed chest discomfort.  may repeat every 5 minutes x2 if chest discomfort not relieved--lie down after  taking--with head elevated 9)  Prilosec Otc 20 Mg Tbec (Omeprazole magnesium) .... Take  one 30-60 min before first meal of the day 10)  Flexeril 10 Mg Tabs (Cyclobenzaprine hcl) .... Take 1 tab by mouth at bedtime as needed muscle spasm or back pain 11)  Actos 45 Mg Tabs (Pioglitazone hcl) .Marland Kitchen.. 1 tab by mouth daily 12)  Clobetasol Propionate 0.05 % Crea (Clobetasol propionate) .... Apply to affected areas of skin two times a day --not to face 13)  Diclofenac Sodium 75 Mg Tbec (Diclofenac sodium) .Marland Kitchen.. 1 tab by mouth two times a day with food. 14)  Lotrisone 1-0.05 % Crea (Clotrimazole-betamethasone) .... Apply two times a day to feet for 3 weeks  Other Orders: Capillary Blood Glucose/CBG (21308)   Primary Care Khaniya Tenaglia:  Julieanne Manson MD  CC:  BP Check.  History of Present Illness: Took all of her medications today; not currently on any BP meds.  C/o persistent rash to both arms and her back; scabbed areas and itching, states it's stinging pain.  Needs more clobetasol cream.   Review of Systems CV:  Denies bluish discoloration of lips or nails, chest pain or discomfort, difficulty breathing at night, difficulty breathing while lying down, fainting, fatigue, leg cramps with exertion, lightheadness, near fainting, palpitations, shortness of breath with exertion, swelling  of feet, swelling of hands, and weight gain. Derm:  Complains of changes in color of skin, itching, and rash; Diffuse reddened rash to upper extremeties and back, many scabbed, c/o persistent itching/stinging.Marland Kitchen   Physical Exam  Lungs:  normal respiratory effort, normal breath sounds, no crackles, and no wheezes.   Heart:  normal rate and regular rhythm.   Skin:  Diffuse raised reddened rash to both upper extremeties and back, many are scabbed.   Patient Instructions: 1)  Seen by Jesse Fall 2)  Your blood pressure is good. 3)  Take doxycycline 100 mg. one capsule by mouth twice a day for 10 days for rash. 4)   Make appointment to see Dr. Delrae Alfred in four weeks for diabetes and skin rash.  May consider medication changes if diabetes is still uncontrolled. 5)  Call if anything changes or if you have any questions.  CC: BP Check Is Patient Diabetic? Yes Pain Assessment Patient in pain? yes     Location: rash on arms Intensity: 8 Type: stinging CBG Result 197 CBG Device ID A  Does patient need assistance? Functional Status Self care Ambulation Normal   Allergies: 1)  ! Oxycodone-Acetaminophen (Oxycodone-Acetaminophen) Laboratory Results   Blood Tests     CBG Random:: 197mg /dL     Orders Added: 1)  Capillary Blood Glucose/CBG [82948] 2)  Est. Patient Level II [45409]

## 2010-08-26 NOTE — Assessment & Plan Note (Signed)
Summary: per check out/sf      Allergies Added:   Primary Provider:  Julieanne Manson MD  CC:  some SOB but a little better.  History of Present Illness: 45 yo with history of obesity, diabetes, and hyperlipidemia returns for followup of dyspnea and atypical chest pain. Patient's shortness of breath has lasted about a year at this point.  We did an ETT-myoview which showed EF 71% and no evidence for ischemia or infarction.  She had a vigorous heart rate response suggestive of deconditioning.  Echo showed mild LVH with normal systolic and diastolic function.  No significant valvular abnormalities.    She has actually tried to start walking more.  She feels like she is less short of breath with exertion now that she is getting some exercise.  She does snore at night but does not have significant daytime sleepiness.    Labs (11/10): HCT 36.6, creatinine 0.55, LDL 132, HDL 31, TG 119, BNP 24  Current Medications (verified): 1)  Amaryl 4 Mg  Tabs (Glimepiride) .... One Tablet By Mouth Twice Daily 2)  Metformin Hcl 1000 Mg  Tabs (Metformin Hcl) .... One Tablet  By Mouth Twice Daily 3)  Glucometer Elite Classic   Kit (Blood Glucose Monitoring Suppl) .... Two Times A Day Blood Sugar Checks 4)  Glucometer Elite Test   Strp (Glucose Blood) .... Two Times A Day Sugar Checks 5)  Ibuprofen 800 Mg Tabs (Ibuprofen) .Marland Kitchen.. 1 Tab By Mouth Two Times A Day 6)  Aspir-Low 81 Mg Tbec (Aspirin) .Marland Kitchen.. 1 Tab By Mouth Daily 7)  Carvedilol 6.25 Mg Tabs (Carvedilol) .Marland Kitchen.. 1 Tab By Mouth Two Times A Day 8)  Pravastatin Sodium 40 Mg Tabs (Pravastatin Sodium) .Marland Kitchen.. 1 Tab By Mouth Daily 9)  Nitrostat 0.4 Mg Subl (Nitroglycerin) .Marland Kitchen.. 1 Sublingual As Needed Chest Discomfort.  May Repeat Every 5 Minutes X2 If Chest Discomfort Not Relieved--Lie Down After Taking--With Head Elevated 10)  Ferrous Sulfate 325 (65 Fe) Mg Tabs (Ferrous Sulfate) .Marland Kitchen.. 1 Tab By Mouth Daily  Allergies (verified): 1)  ! Oxycodone-Acetaminophen  (Oxycodone-Acetaminophen)  Past History:  Past Medical History: 1. Diabetes mellitus type II 2. Obesity 3.  Low back pain 4.  Hyperlipidemia 5.  Exertional dyspnea: Suspect due to obesity and deconditioning.  Echo showed mild LVH, EF 60-65%, no significant diastolic dysfunction.  6.  Atypical chest pain: ETT-myoview with EF 71%, no ischemia or infarction, vigorous heart rate response suggestive of deconditioning.    Family History: Reviewed history from 06/26/2009 and no changes required. Mother, died 35, 3 days after giving birth to pt--not sure what cause of death was.  Mother was also adopted. Father, Knows nothing about him.   No siblings Daughter, 37:  healthy Son, 21:  asthma. No known premature coronary disease.   Social History: Reviewed history from 06/26/2009 and no changes required. 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 Smokes 1 cigarette every 2 weeks or so.   Vital Signs:  Patient profile:   45 year old female Menstrual status:  irregular Height:      58 inches Weight:      208 pounds BMI:     43.63 Pulse rate:   75 / minute BP sitting:   106 / 74  (left arm) Cuff size:   regular  Vitals Entered By: Hardin Negus, RMA (July 30, 2009 8:17 AM)  Physical Exam  General:  Well developed, well nourished, in no acute distress. Obese.  Neck:  Neck supple,  no JVD. No masses, thyromegaly or abnormal cervical nodes. Lungs:  Clear bilaterally to auscultation and percussion. Heart:  Non-displaced PMI, chest non-tender; regular rate and rhythm, S1, S2 without murmurs, rubs or gallops. Carotid upstroke normal, no bruit.  Pedals normal pulses. No edema, no varicosities. Abdomen:  Bowel sounds positive; abdomen soft and non-tender without masses, organomegaly, or hernias noted. No hepatosplenomegaly.  Obese.  Extremities:  No clubbing or cyanosis. Neurologic:  Alert and oriented x 3. Psych:  Normal affect.   Impression &  Recommendations:  Problem # 1:  DYSPNEA ON EXERTION (ICD-786.09) I suspect that this is due to obesity and deconditioning.  She had a vigorous and rapid rise in her heart rate on ETT suggestive of deconditioning.  Echo and myoview showed no major cardiac abnormalities.  I think diet and exercise will be important for her.  She has the right body habitus and she snores, but she does not have daytime sleepiness consistent with OSA.  Would keep an eye on this in the future.   She can followup in this office as needed.   Patient Instructions: 1)  Your physician recommends that you schedule a follow-up appointment as needed with Dr. Marca Ancona

## 2010-10-15 LAB — URINALYSIS, ROUTINE W REFLEX MICROSCOPIC
Nitrite: NEGATIVE
Protein, ur: NEGATIVE mg/dL
Specific Gravity, Urine: 1.033 — ABNORMAL HIGH (ref 1.005–1.030)
Urobilinogen, UA: 0.2 mg/dL (ref 0.0–1.0)

## 2010-10-15 LAB — DIFFERENTIAL
Basophils Absolute: 0 10*3/uL (ref 0.0–0.1)
Lymphocytes Relative: 24 % (ref 12–46)
Lymphs Abs: 1.8 10*3/uL (ref 0.7–4.0)
Neutro Abs: 5.3 10*3/uL (ref 1.7–7.7)
Neutrophils Relative %: 69 % (ref 43–77)

## 2010-10-15 LAB — COMPREHENSIVE METABOLIC PANEL
BUN: 7 mg/dL (ref 6–23)
CO2: 26 mEq/L (ref 19–32)
Calcium: 9.3 mg/dL (ref 8.4–10.5)
Chloride: 102 mEq/L (ref 96–112)
Creatinine, Ser: 0.54 mg/dL (ref 0.4–1.2)
GFR calc Af Amer: >60 mL/min (ref >60)
GFR calc non Af Amer: >60 mL/min (ref >60)
Total Bilirubin: 0.7 mg/dL (ref 0.3–1.2)

## 2010-10-15 LAB — CBC
HCT: 39.2 % (ref 36.0–46.0)
MCHC: 33.9 g/dL (ref 30.0–36.0)
MCV: 77.9 fL — ABNORMAL LOW (ref 78.0–100.0)
Platelets: 271 10*3/uL (ref 150–400)
RBC: 5.03 MIL/uL (ref 3.87–5.11)
WBC: 7.7 10*3/uL (ref 4.0–10.5)

## 2010-10-15 LAB — URINE MICROSCOPIC-ADD ON

## 2010-10-15 LAB — LIPASE, BLOOD: Lipase: 25 U/L (ref 11–59)

## 2010-11-11 ENCOUNTER — Ambulatory Visit: Payer: Self-pay | Admitting: Family Medicine

## 2010-12-01 ENCOUNTER — Ambulatory Visit (INDEPENDENT_AMBULATORY_CARE_PROVIDER_SITE_OTHER): Payer: Self-pay | Admitting: Family Medicine

## 2010-12-01 ENCOUNTER — Encounter: Payer: Self-pay | Admitting: Family Medicine

## 2010-12-01 VITALS — BP 107/74 | HR 75 | Temp 98.1°F | Ht 59.0 in | Wt 214.0 lb

## 2010-12-01 DIAGNOSIS — E1165 Type 2 diabetes mellitus with hyperglycemia: Secondary | ICD-10-CM | POA: Insufficient documentation

## 2010-12-01 DIAGNOSIS — M549 Dorsalgia, unspecified: Secondary | ICD-10-CM

## 2010-12-01 DIAGNOSIS — E1169 Type 2 diabetes mellitus with other specified complication: Secondary | ICD-10-CM

## 2010-12-01 DIAGNOSIS — G8929 Other chronic pain: Secondary | ICD-10-CM

## 2010-12-01 DIAGNOSIS — E785 Hyperlipidemia, unspecified: Secondary | ICD-10-CM | POA: Insufficient documentation

## 2010-12-01 DIAGNOSIS — E669 Obesity, unspecified: Secondary | ICD-10-CM

## 2010-12-01 DIAGNOSIS — E119 Type 2 diabetes mellitus without complications: Secondary | ICD-10-CM

## 2010-12-01 LAB — POCT GLYCOSYLATED HEMOGLOBIN (HGB A1C): Hemoglobin A1C: 8.2

## 2010-12-01 MED ORDER — GLUCOSE BLOOD VI STRP: ORAL_STRIP | Status: DC

## 2010-12-01 NOTE — Patient Instructions (Addendum)
Nice to meet you! I will send your strip prescription to [facility]. I will call with your test results. Make an appointment in 3-4 months to talk about your diabetes, or sooner if needed.  Diabetes and Exercise Regular exercise is important and can help:   Control blood glucose (sugar).   Decrease blood pressure.   Control blood lipids (cholesterol and triglycerides).   Improve overall health.  BENEFITS FROM EXERCISE:  Improved fitness.   Improved flexibility.   Improved endurance.   Increased bone density.   Weight control.   Increased muscle strength.   Decreased body fat.   Improvement of the body's use of a hormone called insulin.   Increased insulin sensitivity.   Reduction of insulin needs.   Helps you feel better.   Reduces stress and tension.  People with diabetes who add exercise to their lifestyle gain additional benefits.   Weight loss.   Reduces appetite.   Improves body's use of blood glucose (sugar).   Decreases risk factors for heart disease:   Lowering of cholesterol and triglycerides.   Raising the level of good cholesterol (high-density lipoproteins [HDL]).   Lowering blood sugar.   Decreases blood pressure.  TYPE 1 DIABETES AND EXERCISE  Exercise will usually lower your blood glucose.   If blood glucose is greater than 240 mg/dl, check urine ketones. If ketones are present, do not exercise.   Location of the insulin injection sites may need to be adjusted with exercise. Avoid injecting insulin into areas of the body that will be exercised. For example, avoid injecting insulin into:   The arms when playing tennis.   The legs when jogging. For more information, discuss this with your caregiver.   Keep a record of:   Food intake.   Type and amount of exercise.   Expected peak times of insulin action.   Blood glucose (sugar) levels.  Do this before, during and after exercise. Review your records with your  caregiver(s). This will help you to develop guidelines for adjusting food intake and/or insulin amounts.  TYPE 2 DIABETES AND EXERCISE  Regular physical activity can help control blood glucose.   Exercise is important because it may:   Increase the body's sensitivity to insulin.   Improve blood glucose control.   Exercise reduces the risk of heart disease. It decreases serum cholesterol and triglycerides. It also lowers blood pressure.   Those who take insulin or oral hypoglycemic agents should watch for signs of hypoglycemia. These signs include dizziness, shaking, sweating, chills and confusion.   Body water is lost during exercise. It must be replaced. This will help to avoid loss of body fluids (dehydration) and/or heat stroke.  Be sure to talk to your caregiver before starting an exercise program to make sure it is safe for you. Remember, any activity is better than none.  Document Released: 10/03/2003 Document Re-Released: 05/10/2009 [facility] Patient Information 2011 [place], [place].

## 2010-12-02 ENCOUNTER — Encounter: Payer: Self-pay | Admitting: Family Medicine

## 2010-12-02 DIAGNOSIS — M549 Dorsalgia, unspecified: Secondary | ICD-10-CM | POA: Insufficient documentation

## 2010-12-02 DIAGNOSIS — G8929 Other chronic pain: Secondary | ICD-10-CM | POA: Insufficient documentation

## 2010-12-02 LAB — CBC
Hemoglobin: 12.8 g/dL (ref 12.0–15.0)
MCH: 25.4 pg — ABNORMAL LOW (ref 26.0–34.0)
MCHC: 34.8 g/dL (ref 30.0–36.0)

## 2010-12-02 LAB — BASIC METABOLIC PANEL
CO2: 22 mEq/L (ref 19–32)
Chloride: 103 mEq/L (ref 96–112)
Creat: 0.6 mg/dL (ref 0.40–1.20)
Glucose, Bld: 202 mg/dL — ABNORMAL HIGH (ref 70–99)

## 2010-12-02 LAB — LDL CHOLESTEROL, DIRECT: Direct LDL: 136 mg/dL — ABNORMAL HIGH

## 2010-12-02 NOTE — Assessment & Plan Note (Signed)
Encouraged increased exercise >30 min/5 times weekly for weight loss and benefits to chronic disease management. Will check TSH.

## 2010-12-02 NOTE — Assessment & Plan Note (Addendum)
No acute changes or red flags currently. Likely secondary to radiculopathy as diagnosed in 2005 in combination with OA.  Recommend to try tylenol if pain med needed.

## 2010-12-02 NOTE — Progress Notes (Signed)
  Subjective:    Patient ID: Kristy [name], female    DOB: 04-21-1966, 45 y.o.   MRN: [id number]  HPI New patient transfers from [facility] because of a "falling out" over the loss of her medical records.   1. DM. Has been diabetic since 2007. Taking meds as prescribed, but lost her meter. Can use a different one, but does need strips for onetouch ultra meter to be refilled. Not checking BS, but thinks last hypoglycemia was 2 weeks ago based on malaise/fatigue symptoms. Last saw an eye doctor 2 years ago and reported no diabetic retinopathy. Denies foot ulcers, does have mild tingling in feet on occasion. None lately.   2. Back pain. Dx with ruptured disk in 2005 and has pain in same right lumbar area ever since then. Radiates to legs also. No acute exacerbations currently. Not taking pain meds currently.  3. Health maintenance. Last pap was in 2011 and reportedly normal. Does not recall ever having an abnormal test.    Review of Systems Denies visual changes, fatigue, abnormal bleeding, HA, CP. Does have exertional SOB at times.     Objective:   Physical Exam  Vitals reviewed. Constitutional: She is oriented to person, place, and time. She appears well-developed and well-nourished. No distress.  HENT:  Head: Normocephalic and atraumatic.  Eyes: Conjunctivae and EOM are normal. Pupils are equal, round, and reactive to light.  Cardiovascular: Normal rate, regular rhythm, normal heart sounds and intact distal pulses.   No murmur heard. Pulmonary/Chest: Effort normal and breath sounds normal. No respiratory distress. She has no wheezes. She has no rales.  Musculoskeletal: Normal range of motion. She exhibits no edema.       No skin breakdown on feet. Light touch intact throughout plantar surfaces. 2+ DP pulses bilaterally.  Neurological: She is alert and oriented to person, place, and time. No cranial nerve deficit. Coordination normal.  Skin: Skin is warm.  Psychiatric: She has a normal mood  and affect.          Assessment & Plan:

## 2010-12-02 NOTE — Assessment & Plan Note (Addendum)
Will check A1c, continue current management. Multiple oral agents and may need to consider initiating insulin in near future. Refilled test strips for onetouch ultra meter. Will check B12 due to chronic metformin and other routine labs today.  Will check urine for protein and consider initiating low-dose ACEi if indicated. Follow up after [facility] card initiated for diabetic management.

## 2010-12-02 NOTE — Assessment & Plan Note (Signed)
Will check d-LDL today and continue current statin therapy. Encouraged lifestyle modification and consideration of weight loss. Goal <100.

## 2010-12-12 NOTE — Op Note (Signed)
   NAME:  Kristy [name], Kristy [name]                             ACCOUNT NO.:  [id number]   MEDICAL RECORD NO.:  [id number]                   PATIENT TYPE:  AMB   LOCATION:  [facility]                                 FACILITY:  [facility]   PHYSICIAN:  Claudette Laws, M.D.               DATE OF BIRTH:  Dec 18, 1965   DATE OF PROCEDURE:  DATE OF DISCHARGE:                                 OPERATIVE REPORT   PREOPERATIVE DIAGNOSIS:  Right distal ureteral stone.   POSTOPERATIVE DIAGNOSIS:  Right distal ureteral stone.   ANESTHESIA:  General laryngeal mask airway.   PROCEDURE:  1. Cystoscopy.  2. Right-sided ureteroscopic stone extraction.  3. Right ureteral stent removal.   SURGEON:  Dr. Derrell Lolling. Sural.   ASSISTANT:  Dr. Glendora Score.   BRIEF HISTORY:  [name] is a 45 year old, African-American female, who  presented with flank pain and was found to have a right distal ureteral  stone.  Attempted ureteroscopy was unsuccessful due to narrowing of the  distal ureter.  Therefore, a stent was placed and patient was brought back  today for a stent removal.   DESCRIPTION OF PROCEDURE:  Following administration of IV antibiotics and  general anesthesia, [name] was prepped and draped in the dorsal lithotomy  position.  A standard cystoscopy was performed using a 22-French sheath and  a 12 __________lens.  The stent was seen emanating from the right rear  orifice.  Using flexible graspers, the stent was then pulled back distal end  to the urethral meatus.  A 0.038 glide wire was then passed through the  stent and up the right ureter under cystoscopic guidance.  The stent was  then removed.  The cystoscope was also removed.  Using a semi-rigid  ureteroscope, a right-sided ureteroscopy was performed.  Approximately 3 cm  within the ureteral orifice, the stone was seen.  Using a Nitinol basket,  the stone was grasped and easily removed from the ureter and from the  patient's body.  The stone was saved and  given to the patient.  Because of  the atraumatic nature of the procedure, we elected not to replace the stent.  The wire was therefore removed.  The bladder was emptied, and the procedure  was terminated.   DRAINS:  None.   ESTIMATED BLOOD LOSS:  None.   COMPLICATIONS:  None.    DISPOSITION:  Patient was taken to recovery in stable condition.  Dr. Etta Grandchild  was present and participated in the entire procedure.            Melvyn Novas, M.D.                      Claudette Laws, M.D.    DK/MEDQ  D:  11/23/2002  T:  11/23/2002  Job:  [id number]

## 2010-12-12 NOTE — Op Note (Signed)
NAME:  Kristy [name], Kristy [name]                             ACCOUNT NO.:  [id number]   MEDICAL RECORD NO.:  [id number]                   PATIENT TYPE:  AMB   LOCATION:  [facility]                                 FACILITY:  [facility]   PHYSICIAN:  Claudette Laws, M.D.               DATE OF BIRTH:  1966/03/19   DATE OF PROCEDURE:  11/09/2002  DATE OF DISCHARGE:                                 OPERATIVE REPORT   PREOPERATIVE DIAGNOSES:  Distal right ureteral calculus with renal colic and  hydronephrosis.   POSTOPERATIVE DIAGNOSES:  Distal right ureteral calculus with renal colic  and hydronephrosis.   OPERATION:  1. Cystoscopy.  2. Ureteroscopy distal right ureter.  3. Insertion of a 6 French 26 cm double-J stent.   SURGEON:  Claudette Laws, M.D.   HISTORY:  This is a 45 year old lady, who presented in the [facility]  Emergency Room last weekend with right-sided renal colic.  Subsequent CT  scans showed about a 5 mm stone in the distal right ureter.  She has been  having colic on and off since, and I saw her in the office about two days  ago with persistent pain.  We went over her treatment options, including  watchful waiting versus an attempt at ureteroscopy.  She would like to try  to have the stone removed, so I offered her ureteroscopy, either stone  basket or holmium laser lithotripsy.  I explained the procedure to her  carefully, including complications such as postop bleeding, infection,  inability to get the stone, break it up, or inadvertent perforation of the  ureter, and also she understands that she will have a double-J stent put in  postop.  She agreed to the proposed surgery.   DESCRIPTION OF PROCEDURE:  The patient was brought into the cystoscopic  suite, prepped and draped in the dorsal lithotomy position under general  anesthesia, and a B&O suppository was placed per rectum for anesthetic  purposes.  Cystoscopy was performed, revealing a normal bladder, normal  ureteral orifices,  no tumors, no calculi.  Initially, I passed up a .038  Glidewire using fluoroscopic control.  I then passed up a 6.5 [place] short  rigid ureteroscope.  I was able to then do the ureteral orifice just to the  intramural portion at which point I encountered a mild, narrow stricture of  the ureter.  I could not advance the scope beyond this point.  She appeared  to have some ureteral spasm as well.  At this point, I thought it was best  just to go ahead and put up a double-J stent and dilate the ureter, and so a  6 French 26 cm double-J stent was passed over the Glidewire and again  positioned in the kidney, and the distal end was curled up in the bladder.  All instruments were removed.  The bladder was emptied, and she was take  back to the recovery room in satisfactory condition.   The plan now is to keep the double-J stent up for 1-2 weeks and then decide  whether to remove it, and hopefully she can pass some stones spontaneously,  as it is a fairly small stone or possibly bring her back to the OR once the  ureter is dilated and cut or extract the stone.                                                Claudette Laws, M.D.   RFS/MEDQ  D:  11/09/2002  T:  11/09/2002  Job:  [id number]

## 2010-12-29 ENCOUNTER — Telehealth: Payer: Self-pay | Admitting: Family Medicine

## 2010-12-29 NOTE — Telephone Encounter (Signed)
Pt says she has lost her glucose meter & is asking if MD can send in Rx for a new one? Pt is currently at [facility] office trying to qualify for orange card. Wants to know if she could go to the health dept to get meter?

## 2010-12-31 NOTE — Telephone Encounter (Signed)
Please call pt to let her know about receiving a new glucometer asap

## 2011-01-01 NOTE — Telephone Encounter (Signed)
Patient is not on insulin, does not require blood sugar checks regularly. Health department does not carry glucometers. If patient desires, she may purchase glucometer and strips over the counter at most pharmacies, but test strips are expensive. Not medically required to monitor blood sugar daily.

## 2011-01-12 NOTE — Telephone Encounter (Signed)
Pt is on Glimepiride and has been a diabetic for years.  Have always checked her blood sugars daily since being diagnosed.  Please contact health dpt pharmacy to ask what type of glucometer.  Pt states whenever she has to go to the hospital and they ask her about her blood sugars she is unable to give info because she doesn't have the means to check it.

## 2011-01-13 NOTE — Telephone Encounter (Signed)
Spoke with patient and informed her of below. Patient stated that she has looked at prices for the glucometer $17 and $28 which she doesn't have the money for. She stated that she will continue to go to ED when she feels her sugar is low and then will have [place] to handle after that

## 2011-01-13 NOTE — Telephone Encounter (Signed)
Please call patient with this message below. She can purchase a glucometer off the shelf at drugstore without an rx. Thanks.

## 2011-01-13 NOTE — Telephone Encounter (Signed)
The health department does not carry glucometers. I've already called and asked on 6/7. I am told the cheapest are available over the counter at drug stores ([facility], [facility]). She does not need a prescription.

## 2011-01-15 ENCOUNTER — Ambulatory Visit: Payer: Self-pay

## 2011-01-19 ENCOUNTER — Ambulatory Visit (INDEPENDENT_AMBULATORY_CARE_PROVIDER_SITE_OTHER): Payer: Self-pay | Admitting: Family Medicine

## 2011-01-19 ENCOUNTER — Encounter: Payer: Self-pay | Admitting: Family Medicine

## 2011-01-19 VITALS — BP 130/76 | HR 81 | Wt 208.4 lb

## 2011-01-19 DIAGNOSIS — M549 Dorsalgia, unspecified: Secondary | ICD-10-CM

## 2011-01-19 DIAGNOSIS — G8929 Other chronic pain: Secondary | ICD-10-CM

## 2011-01-19 DIAGNOSIS — E119 Type 2 diabetes mellitus without complications: Secondary | ICD-10-CM

## 2011-01-19 DIAGNOSIS — E669 Obesity, unspecified: Secondary | ICD-10-CM

## 2011-01-19 DIAGNOSIS — E1169 Type 2 diabetes mellitus with other specified complication: Secondary | ICD-10-CM

## 2011-01-19 LAB — GLUCOSE, CAPILLARY: Glucose-Capillary: 223 mg/dL — ABNORMAL HIGH (ref 70–99)

## 2011-01-19 MED ORDER — TRAMADOL HCL 50 MG PO TABS: ORAL_TABLET | ORAL | Status: DC

## 2011-01-19 MED ORDER — AMITRIPTYLINE HCL 100 MG PO TABS: 100.0000 mg | ORAL_TABLET | Freq: Every day | ORAL | Status: DC

## 2011-01-19 NOTE — Assessment & Plan Note (Signed)
Trail of amitriptyline 1/2 tab of 100 mg or 50 mg.  Hopefully this will treat chronic pain and insomnia.  Since she has chronic loose stools associated with metformin, not so concerned with GI side effects.  She is to follow up with Dr. Cristal Ford in 4-6 weeks.  Tramadol during the day to keep her at work.  Explained that we will not use narcotics, she was fine with that and itches from them.  She was resistant to formal PT, but agreed to do exercises taught during the visit in the mornings. Encouraged weight loss and discussed abdominal fat likely a factor.

## 2011-01-19 NOTE — Progress Notes (Signed)
  Subjective:    Patient ID: Kristy [name], female    DOB: 1966/01/01, 45 y.o.   MRN: [id number]  HPI  Chronic back pain is making it hard to work.  She works as a Advertising copywriter at [employer].  She reports bending down and back catching, it radiates into her right leg, she has to walk out the pain and take Advil.  She had been using diclofenac and finds it makes her dizzy.  She does not have the same experience with Advil.  Advil is effective for 3 hours at a time, she is taking several doses a day.  Back problems have been present for years, back as far as in her late teens.  She was told by a doctor that her spine was shaped in a way that would cause this.  She apparently  Had a acute disc herniation in 2005.  She is not asking for narcotics, just help with pain so she can work.  She is reluctant to go to physical therapy because of transportation and time away from work.    She denies depression, but endorses poor sleep.  She sleeps 1-2 hour per night.  She also endorses loose stools from her metformin, such that she could not go to work one day.   Review of Systems  Musculoskeletal: Positive for back pain.  Psychiatric/Behavioral: Negative for dysphoric mood. The patient is not nervous/anxious.        Insomnia       Objective:   Physical Exam  Constitutional:       Alert, obese, moved well.  Musculoskeletal:       Negative straight leg raises, no pain stressing SI joint or piriformis muscle. Sway back, no obvious scoliosis.  Non tender to palpation.  Muscle strength 5/5, able to stand on toes and heels, normal gait.   Psychiatric:       Flat affect          Assessment & Plan:

## 2011-01-19 NOTE — Patient Instructions (Signed)
Please return to see Dr. Cristal Ford in 4-6 weeks  Do back exercises daily Take the amitriptyline one hour before bedtime, 1/2 tab Use the tramadol during the day so you can work and be functional:  I recommend starting with one tablet three times a day and adjusting, you may need 2 before work.  It should not make you sleepy. Keep trying to loose weight.

## 2011-01-19 NOTE — Assessment & Plan Note (Signed)
Discussed ways to reduce sugar in the diet

## 2011-02-17 ENCOUNTER — Encounter: Payer: Self-pay | Admitting: Family Medicine

## 2011-02-17 ENCOUNTER — Ambulatory Visit (INDEPENDENT_AMBULATORY_CARE_PROVIDER_SITE_OTHER): Payer: Self-pay | Admitting: Family Medicine

## 2011-02-17 VITALS — BP 130/89 | HR 75 | Temp 98.5°F | Ht 59.5 in | Wt 204.7 lb

## 2011-02-17 DIAGNOSIS — E669 Obesity, unspecified: Secondary | ICD-10-CM

## 2011-02-17 DIAGNOSIS — G8929 Other chronic pain: Secondary | ICD-10-CM

## 2011-02-17 DIAGNOSIS — M549 Dorsalgia, unspecified: Secondary | ICD-10-CM

## 2011-02-17 DIAGNOSIS — E119 Type 2 diabetes mellitus without complications: Secondary | ICD-10-CM

## 2011-02-17 DIAGNOSIS — E1169 Type 2 diabetes mellitus with other specified complication: Secondary | ICD-10-CM

## 2011-02-17 NOTE — Patient Instructions (Signed)
Nice to see you again. You may try adding tylenol (acetaminophen) 650mg  four times daily for back pain. I will call you if your xray is not normal.  Great job remaining active in spite of your pain! Please make appointment in one month to talk about your diabetes.  Back Pain - Chronic Back pain seldom lasts longer than 3 months. Long standing back pain can be caused by a ruptured disc. About half the time the exact cause cannot be found. Arthritis, osteoporosis, tumors, infections, previous back surgery, and other causes may be involved. Anxiety and depression are common factors. A ruptured disc often causes sciatica. This is a pain traveling from the low back down the back of the leg. This is due to irritation of the sciatic nerve. Weakness or numbness in the legs or feet and loss of normal bladder control are more serious signs of disc rupture. You should contact your doctor immediately if these symptoms develop. Avoid bending, heavy lifting, prolonged sitting, and activities which make the problem worse. Continue normal activity as much as possible. Take brief periods of rest throughout the day to reduce your pain during bad periods. A back exercise rehabilitation program can be helpful in reducing symptoms and preventing more pain. Muscle relaxants are sometimes used. Using narcotic pain medicine for long term pain is discouraged. Addiction is a possible outcome.  Surgery is considered only when symptoms do not improve with bed rest and other conservative treatment. You should see your caregiver if problems get worse. SEEK IMMEDIATE MEDICAL CARE IF:  You have marked weakness or numbness in one of your legs.   You have trouble controlling your bladder or bowels.   You develop nausea, vomiting, abdominal pain, shortness of breath or fainting.   You have severe pain not relieved with medications.  Document Released: 08/20/2004 Document Re-Released: 10/09/2008 [facility] Patient Information 2011  [place], [place].

## 2011-02-19 ENCOUNTER — Encounter: Payer: Self-pay | Admitting: Family Medicine

## 2011-02-19 NOTE — Progress Notes (Signed)
  Subjective:    Patient ID: Kristy [name], female    DOB: Apr 03, 1966, 45 y.o.   MRN: [id number]  HPI  1. Back pain. Present for >6 months. Hx of ruptured disk many years ago. Is now having problems bending forward and exacerbated by sitting for long periods of time. Pain located on rt lateral hip/low back area and radiates down rt anterior thigh, mild component to left also. Gets stiffness and feels like walking improves this pain. Seen in clinic one month ago, tramadol is helping but not changed the pain associated with bending and working. Pain is worse at night.  Denies weakness, numbness, falls, drop attacks, incontinence, fevers.   2. DM. Unable to afford glucometer. [facility] does not have free glucometers. Gets meds at [facility] and in the process of changing to the [facility]. Possibly hypoglycemic once 2 weeks ago when she felt weak/jittery and improved with snack.   Review of Systems See HPI. No LOC, skin changes. Has lost 10 lb without trying.    Objective:   Physical Exam  Vitals reviewed. Constitutional: She is oriented to person, place, and time. She appears well-developed and well-nourished. No distress.  HENT:  Head: Normocephalic and atraumatic.  Eyes: EOM are normal. Pupils are equal, round, and reactive to light.  Cardiovascular: Normal rate, regular rhythm and normal heart sounds.   No murmur heard. Pulmonary/Chest: Effort normal and breath sounds normal. No respiratory distress.  Musculoskeletal: Normal range of motion. She exhibits tenderness. She exhibits no edema.       No TTP over bursa or spinous processes. Mildly TTP over rt SI joint area. Mild pain with spinal flexion, none with extension. Negative straight leg testing bilaterally. Negative facet loading. Soreness with Pearlean Brownie test R>L.   Neurological: She is alert and oriented to person, place, and time. She exhibits normal muscle tone.       Normal sensation in legs. 5/5 distal LE strength B          Assessment & Plan:

## 2011-02-19 NOTE — Assessment & Plan Note (Signed)
With persistence >40mo, worse at night and weight loss, will check plain film lumbosacral spine to rule out bone pathology. Most likely radiculopathy vs SI joint inflammation. Recommend continuation of prn tramadol and addition of scheduled tylenol. Encouraged to stay active with working and walking.

## 2011-02-19 NOTE — Assessment & Plan Note (Signed)
Encouraged to f/u in next month. May need to consider insulin initiation if A1c deteriorated, likely would start low dose basal insulin available through [facility] if patient qualifies.

## 2011-02-23 ENCOUNTER — Ambulatory Visit
Admission: RE | Admit: 2011-02-23 | Discharge: 2011-02-23 | Disposition: A | Discharge status: Home / Self Care | Payer: Self-pay | Source: Ambulatory Visit | Attending: Family Medicine | Admitting: Family Medicine

## 2011-02-23 ENCOUNTER — Other Ambulatory Visit: Payer: Self-pay | Admitting: Family Medicine

## 2011-02-23 DIAGNOSIS — M549 Dorsalgia, unspecified: Secondary | ICD-10-CM

## 2011-02-23 DIAGNOSIS — G8929 Other chronic pain: Secondary | ICD-10-CM

## 2011-03-19 ENCOUNTER — Ambulatory Visit: Payer: Self-pay | Admitting: Family Medicine

## 2011-03-26 ENCOUNTER — Ambulatory Visit: Payer: Self-pay | Admitting: Family Medicine

## 2011-03-29 ENCOUNTER — Emergency Department (HOSPITAL_COMMUNITY)
Admission: EM | Admit: 2011-03-29 | Discharge: 2011-03-29 | Disposition: A | Discharge status: Home / Self Care | Payer: Self-pay | Attending: Emergency Medicine | Admitting: Emergency Medicine

## 2011-03-29 DIAGNOSIS — R5383 Other fatigue: Secondary | ICD-10-CM | POA: Insufficient documentation

## 2011-03-29 DIAGNOSIS — R5381 Other malaise: Secondary | ICD-10-CM | POA: Insufficient documentation

## 2011-03-29 DIAGNOSIS — E119 Type 2 diabetes mellitus without complications: Secondary | ICD-10-CM | POA: Insufficient documentation

## 2011-03-29 LAB — GLUCOSE, CAPILLARY
Glucose-Capillary: 394 mg/dL — ABNORMAL HIGH (ref 70–99)
Glucose-Capillary: 273 mg/dL — ABNORMAL HIGH (ref 70–99)

## 2011-04-06 ENCOUNTER — Ambulatory Visit: Payer: Self-pay | Admitting: Family Medicine

## 2011-04-13 ENCOUNTER — Ambulatory Visit (INDEPENDENT_AMBULATORY_CARE_PROVIDER_SITE_OTHER): Payer: Self-pay | Admitting: Family Medicine

## 2011-04-13 ENCOUNTER — Encounter: Payer: Self-pay | Admitting: Family Medicine

## 2011-04-13 VITALS — BP 113/85 | HR 84 | Temp 97.8°F | Ht 59.0 in | Wt 198.0 lb

## 2011-04-13 DIAGNOSIS — E1169 Type 2 diabetes mellitus with other specified complication: Secondary | ICD-10-CM

## 2011-04-13 DIAGNOSIS — E669 Obesity, unspecified: Secondary | ICD-10-CM

## 2011-04-13 DIAGNOSIS — E119 Type 2 diabetes mellitus without complications: Secondary | ICD-10-CM

## 2011-04-13 DIAGNOSIS — G8929 Other chronic pain: Secondary | ICD-10-CM

## 2011-04-13 DIAGNOSIS — M549 Dorsalgia, unspecified: Secondary | ICD-10-CM

## 2011-04-13 DIAGNOSIS — E785 Hyperlipidemia, unspecified: Secondary | ICD-10-CM

## 2011-04-13 MED ORDER — GLIPIZIDE 10 MG PO TABS: 10.0000 mg | ORAL_TABLET | Freq: Every day | ORAL | Status: DC

## 2011-04-13 MED ORDER — METFORMIN HCL 1000 MG PO TABS: 1000.0000 mg | ORAL_TABLET | Freq: Two times a day (BID) | ORAL | Status: DC

## 2011-04-13 MED ORDER — SIMVASTATIN 40 MG PO TABS: 40.0000 mg | ORAL_TABLET | Freq: Every day | ORAL | Status: DC

## 2011-04-13 NOTE — Assessment & Plan Note (Addendum)
Severely decompensated A1c from 8.1-->11.6 due to medication noncompliance. Patient instructed to use [facility] pharmacy now that she has orange card. Will restart metformin BID and change sulfonylurea to glipizide which is available at [facility]. Will not restart amaryl today, but may need an additional agent or increase in glipizide if control not improved in near future. Will f/u in 3 weeks for repeat labs and urine prot. Would likely benefit from low dose ACEi also.

## 2011-04-13 NOTE — Patient Instructions (Signed)
[facility] We will send those over today. Make an appointment in 3-4 weeks for diabetes follow up. Call if you have any problems before then!  Diabetes and Exercise Regular exercise is important and can help:   Control blood glucose (sugar).   Decrease blood pressure.   Control blood lipids (cholesterol and triglycerides).   Improve overall health.  BENEFITS FROM EXERCISE:  Improved fitness.   Improved flexibility.   Improved endurance.   Increased bone density.   Weight control.   Increased muscle strength.   Decreased body fat.   Improvement of the body's use of a hormone called insulin.   Increased insulin sensitivity.   Reduction of insulin needs.   Helps you feel better.   Reduces stress and tension.  People with diabetes who add exercise to their lifestyle gain additional benefits.   Weight loss.   Reduces appetite.   Improves body's use of blood glucose (sugar).   Decreases risk factors for heart disease:   Lowering of cholesterol and triglycerides.   Raising the level of good cholesterol (high-density lipoproteins [HDL]).   Lowering blood sugar.   Decreases blood pressure.  TYPE 1 DIABETES AND EXERCISE  Exercise will usually lower your blood glucose.   If blood glucose is greater than 240 mg/dl, check urine ketones. If ketones are present, do not exercise.   Location of the insulin injection sites may need to be adjusted with exercise. Avoid injecting insulin into areas of the body that will be exercised. For example, avoid injecting insulin into:   The arms when playing tennis.   The legs when jogging. For more information, discuss this with your caregiver.   Keep a record of:   Food intake.   Type and amount of exercise.   Expected peak times of insulin action.   Blood glucose (sugar) levels.  Do this before, during and after exercise. Review your records with your  caregiver(s). This will help you to develop guidelines for adjusting food intake and/or insulin amounts.  TYPE 2 DIABETES AND EXERCISE  Regular physical activity can help control blood glucose.   Exercise is important because it may:   Increase the body's sensitivity to insulin.   Improve blood glucose control.   Exercise reduces the risk of heart disease. It decreases serum cholesterol and triglycerides. It also lowers blood pressure.   Those who take insulin or oral hypoglycemic agents should watch for signs of hypoglycemia. These signs include dizziness, shaking, sweating, chills and confusion.   Body water is lost during exercise. It must be replaced. This will help to avoid loss of body fluids (dehydration) and/or heat stroke.  Be sure to talk to your caregiver before starting an exercise program to make sure it is safe for you. Remember, any activity is better than none.  Document Released: 10/03/2003 Document Re-Released: 05/10/2009 [facility] Patient Information 2011 [place], [place].

## 2011-04-13 NOTE — Assessment & Plan Note (Addendum)
Likely to be elevated secondary to medication noncompliance. Will f/u FLP at next visit. Switch to statin covered at [facility] pharmacy.

## 2011-04-13 NOTE — Assessment & Plan Note (Signed)
Stable on current prn tramadol. Has enough medication to last until next visit. No red flag symptoms or exacerbation today.

## 2011-04-13 NOTE — Progress Notes (Signed)
  Subjective:    Patient ID: Kristy [name], female    DOB: [birth year], 45 y.o.   MRN: [id number]  HPI 1. DM. Patient has run out of medications completely 3 weeks ago. Formerly she was a [facility] patient and recently transitioned to [facility] and has orange card. Patient attempted to get medications from [facility] and they would not dispense since she changed doctors. Patient did not call about this problem, but is following up today. Had one episode of hyperglycemia ~300 3 weeks ago and presented to ED, was discharged. She infrequently uses a friend's glucometer, but not recently. No skin ulcers, visual changes, syncope. Intermittent burning in left toes.  2. Depression. Has depression medication and feels stable. No complaints of overwhelming depression.  3. Back pain. No new pain, falls, weakness. Uses prn tramadol and stable.  I have reviewed the patient's medical history in detail and updated the computerized patient record.  Review of Systems See HPI otherwise negative.     Objective:   Physical Exam  Vitals reviewed. Constitutional: She is oriented to person, place, and time. She appears well-developed and well-nourished. No distress.  HENT:  Head: Normocephalic and atraumatic.  Eyes: EOM are normal. Pupils are equal, round, and reactive to light.       Fundus exam benign  Cardiovascular: Normal rate, regular rhythm, normal heart sounds and intact distal pulses.   No murmur heard. Pulmonary/Chest: Effort normal and breath sounds normal. No respiratory distress. She has no wheezes. She has no rales.  Abdominal: Soft.  Musculoskeletal: She exhibits no edema and no tenderness.  Neurological: She is alert and oriented to person, place, and time. No cranial nerve deficit. She exhibits normal muscle tone. Coordination normal.  Skin: She is not diaphoretic.  Psychiatric: She has a normal mood and affect.          Assessment & Plan:

## 2011-04-14 ENCOUNTER — Emergency Department (HOSPITAL_COMMUNITY)
Admission: EM | Admit: 2011-04-14 | Discharge: 2011-04-14 | Disposition: A | Discharge status: Home / Self Care | Payer: Self-pay | Attending: Emergency Medicine | Admitting: Emergency Medicine

## 2011-04-14 ENCOUNTER — Emergency Department (HOSPITAL_COMMUNITY): Payer: Self-pay

## 2011-04-14 DIAGNOSIS — E119 Type 2 diabetes mellitus without complications: Secondary | ICD-10-CM | POA: Insufficient documentation

## 2011-04-14 DIAGNOSIS — E785 Hyperlipidemia, unspecified: Secondary | ICD-10-CM | POA: Insufficient documentation

## 2011-04-14 DIAGNOSIS — M25519 Pain in unspecified shoulder: Secondary | ICD-10-CM | POA: Insufficient documentation

## 2011-04-14 DIAGNOSIS — Y92009 Unspecified place in unspecified non-institutional (private) residence as the place of occurrence of the external cause: Secondary | ICD-10-CM | POA: Insufficient documentation

## 2011-04-14 DIAGNOSIS — W108XXA Fall (on) (from) other stairs and steps, initial encounter: Secondary | ICD-10-CM | POA: Insufficient documentation

## 2011-04-14 DIAGNOSIS — S40019A Contusion of unspecified shoulder, initial encounter: Secondary | ICD-10-CM | POA: Insufficient documentation

## 2011-04-17 LAB — DIFFERENTIAL
Basophils Absolute: 0
Basophils Relative: 0
Eosinophils Relative: 1
Monocytes Absolute: 0.3
Neutro Abs: 6

## 2011-04-17 LAB — I-STAT 8, (EC8 V) (CONVERTED LAB)
BUN: <3 — ABNORMAL LOW
Bicarbonate: 22.8
Glucose, Bld: 297 — ABNORMAL HIGH
pCO2, Ven: 34.2 — ABNORMAL LOW

## 2011-04-17 LAB — CBC
Hemoglobin: 15.2 — ABNORMAL HIGH
MCHC: 34.1
Platelets: 263
RDW: 15.3

## 2011-04-17 LAB — POCT I-STAT CREATININE: Creatinine, Ser: 0.7

## 2011-04-17 LAB — URINALYSIS, ROUTINE W REFLEX MICROSCOPIC
Bilirubin Urine: NEGATIVE
Ketones, ur: 40 — AB
Nitrite: NEGATIVE
Specific Gravity, Urine: >1.046 — ABNORMAL HIGH
Urobilinogen, UA: 1

## 2011-05-06 ENCOUNTER — Ambulatory Visit: Payer: Self-pay | Admitting: Family Medicine

## 2011-05-11 LAB — URINALYSIS, ROUTINE W REFLEX MICROSCOPIC
Leukocytes, UA: NEGATIVE
Nitrite: NEGATIVE
Specific Gravity, Urine: 1.036 — ABNORMAL HIGH
pH: 6.5

## 2011-05-11 LAB — CBC
Hemoglobin: 15.9 — ABNORMAL HIGH
RBC: 5.93 — ABNORMAL HIGH
RDW: 14.7 — ABNORMAL HIGH

## 2011-05-11 LAB — BASIC METABOLIC PANEL
Calcium: 9.5
GFR calc Af Amer: >60
GFR calc non Af Amer: >60
Glucose, Bld: 400 — ABNORMAL HIGH
Sodium: 132 — ABNORMAL LOW

## 2011-05-26 ENCOUNTER — Encounter: Payer: Self-pay | Admitting: Family Medicine

## 2011-05-26 ENCOUNTER — Ambulatory Visit (INDEPENDENT_AMBULATORY_CARE_PROVIDER_SITE_OTHER): Payer: No Typology Code available for payment source | Admitting: Family Medicine

## 2011-05-26 VITALS — BP 127/81 | HR 77 | Temp 98.6°F | Ht 59.0 in | Wt 187.0 lb

## 2011-05-26 DIAGNOSIS — E669 Obesity, unspecified: Secondary | ICD-10-CM

## 2011-05-26 DIAGNOSIS — G8929 Other chronic pain: Secondary | ICD-10-CM

## 2011-05-26 DIAGNOSIS — Z23 Encounter for immunization: Secondary | ICD-10-CM

## 2011-05-26 DIAGNOSIS — E785 Hyperlipidemia, unspecified: Secondary | ICD-10-CM

## 2011-05-26 DIAGNOSIS — M549 Dorsalgia, unspecified: Secondary | ICD-10-CM

## 2011-05-26 DIAGNOSIS — E119 Type 2 diabetes mellitus without complications: Secondary | ICD-10-CM

## 2011-05-26 MED ORDER — TETANUS-DIPHTH-ACELL PERTUSSIS 5-2.5-18.5 LF-MCG/0.5 IM SUSP: 0.5000 mL | Freq: Once | INTRAMUSCULAR | Status: DC

## 2011-05-26 MED ORDER — LISINOPRIL 10 MG PO TABS: 5.0000 mg | ORAL_TABLET | Freq: Every day | ORAL | Status: DC

## 2011-05-26 MED ORDER — GLIPIZIDE 10 MG PO TABS: 10.0000 mg | ORAL_TABLET | Freq: Every day | ORAL | Status: DC

## 2011-05-26 NOTE — Patient Instructions (Signed)
Great job taking care of your diabetes. You can improve blood sugar by taking half tablet of glipizide with dinner/evening meal. Watch for signs of low blood sugar and check your sugar if dizzy, weak, jittery. Look for Vitamin B12 over the counter. I will send you a new medicine to help your kidneys to pharmacy-lisinopril. Make an appointment in one month.  If you run out of medicines, call me!

## 2011-05-26 NOTE — Assessment & Plan Note (Signed)
Controlled with amitriptylene and prn tramadol. Will continue for now with goal of maximal physical activity. Consider PT if worsens in the future.

## 2011-05-26 NOTE — Assessment & Plan Note (Addendum)
Uncontrolled though likely improved with oral medications. Fasting CBGs >200. Will add low dose glipizide 5mg  with dinner as Actos in not affordable currently. Follow up A1c in one month. May need to increase glipizide or discuss starting insulin if still greatly uncontrolled. Will start lisinopril 5mg  today for renal protection. Check BMET at next visit.

## 2011-05-26 NOTE — Assessment & Plan Note (Signed)
11 lb weight loss in past 2 months. Encouraged physical activity and will follow up at next visit.

## 2011-05-26 NOTE — Progress Notes (Signed)
  Subjective:    Patient ID: Kristy [name], female    DOB: [birth year], 45 y.o.   MRN: [id number]  HPI  1. DM. Patient has now been taking her meds for past 6 weeks since getting them at [facility]. Last a1c was greatly deteriorated to 11.6 because she couldn't afford meds. Glipizide in am and metformin BID.  Also was able to get a meter at the [facility]. Has fasting CBGs of ~250 recently. No hypoglycemia or dizzyness, fatigue, feet pain, wounds, visual changes, polyuria.  2. Back pain. Began taking amitryptilene 50mg  daily and back pain is much improved. One night took 100mg  and felt very sleepy the next morning, but denies side effects of half tablet.   3. HLD. Taking statin now after getting at HD.   Review of Systems See hPI otherwise negative.     Objective:   Physical Exam  Vitals reviewed. Constitutional: She is oriented to person, place, and time. She appears well-developed and well-nourished. No distress.  HENT:  Head: Normocephalic and atraumatic.  Cardiovascular: Normal rate, regular rhythm, normal heart sounds and intact distal pulses.   No murmur heard. Pulmonary/Chest: Effort normal and breath sounds normal. No respiratory distress. She has no wheezes. She has no rales.  Musculoskeletal: She exhibits no edema and no tenderness.       Normal gait  Neurological: She is alert and oriented to person, place, and time. No cranial nerve deficit. Coordination normal.  Skin: She is not diaphoretic.  Psychiatric: She has a normal mood and affect.          Assessment & Plan:

## 2011-05-26 NOTE — Assessment & Plan Note (Signed)
Continue zocor and follow up FLP with next lab draw.

## 2011-06-24 ENCOUNTER — Ambulatory Visit: Payer: No Typology Code available for payment source | Admitting: Family Medicine

## 2011-07-10 ENCOUNTER — Ambulatory Visit: Payer: No Typology Code available for payment source | Admitting: Family Medicine

## 2011-07-31 ENCOUNTER — Encounter: Payer: Self-pay | Admitting: Family Medicine

## 2011-07-31 ENCOUNTER — Ambulatory Visit (INDEPENDENT_AMBULATORY_CARE_PROVIDER_SITE_OTHER): Payer: No Typology Code available for payment source | Admitting: Family Medicine

## 2011-07-31 VITALS — BP 115/82 | HR 78 | Temp 97.6°F | Ht 59.0 in | Wt 181.0 lb

## 2011-07-31 DIAGNOSIS — E119 Type 2 diabetes mellitus without complications: Secondary | ICD-10-CM

## 2011-07-31 DIAGNOSIS — E669 Obesity, unspecified: Secondary | ICD-10-CM

## 2011-07-31 DIAGNOSIS — E1169 Type 2 diabetes mellitus with other specified complication: Secondary | ICD-10-CM

## 2011-07-31 DIAGNOSIS — B373 Candidiasis of vulva and vagina: Secondary | ICD-10-CM

## 2011-07-31 LAB — BASIC METABOLIC PANEL
BUN: 10 mg/dL (ref 6–23)
CO2: 21 mEq/L (ref 19–32)
Chloride: 99 mEq/L (ref 96–112)
Creat: 0.66 mg/dL (ref 0.50–1.10)
Glucose, Bld: 313 mg/dL — ABNORMAL HIGH (ref 70–99)

## 2011-07-31 LAB — LIPID PANEL: Cholesterol: 272 mg/dL — ABNORMAL HIGH (ref 0–200)

## 2011-07-31 LAB — CBC
MCH: 26.2 pg (ref 26.0–34.0)
MCHC: 35 g/dL (ref 30.0–36.0)
Platelets: 338 10*3/uL (ref 150–400)
RDW: 14.8 % (ref 11.5–15.5)

## 2011-07-31 LAB — POCT URINALYSIS DIPSTICK
Ketones, UA: 40
Leukocytes, UA: NEGATIVE
Nitrite, UA: NEGATIVE
Protein, UA: NEGATIVE
Urobilinogen, UA: 0.2

## 2011-07-31 LAB — POCT UA - MICROSCOPIC ONLY

## 2011-07-31 MED ORDER — INSULIN NPH (HUMAN) (ISOPHANE) 100 UNIT/ML ~~LOC~~ SUSP: 5.0000 [IU] | Freq: Two times a day (BID) | SUBCUTANEOUS | Status: DC

## 2011-07-31 MED ORDER — INSULIN GLARGINE 100 UNIT/ML ~~LOC~~ SOLN: 10.0000 [IU] | Freq: Every day | SUBCUTANEOUS | Status: DC

## 2011-07-31 MED ORDER — METFORMIN HCL 1000 MG PO TABS: 500.0000 mg | ORAL_TABLET | Freq: Two times a day (BID) | ORAL | Status: DC

## 2011-07-31 MED ORDER — "INSULIN SYRINGE-NEEDLE U-100 31G X 5/16"" 1 ML MISC": 1.0000 | Freq: Two times a day (BID) | Status: DC

## 2011-07-31 MED ORDER — SIMVASTATIN 40 MG PO TABS: 40.0000 mg | ORAL_TABLET | Freq: Every day | ORAL | Status: DC

## 2011-07-31 NOTE — Assessment & Plan Note (Addendum)
A1c deteriorated from 11 to >14 due to noncompliant with oral medications. Appears somewhat dry but no symptoms of DKA as patient is eating and drinking normally. Unable to assess improvement on metformin and glipizide because patient not consistent with taking meds, though at this point patient will need insulin. Will start NPH 5 units BID as this is available at HD and patient agrees to get this today because her daughter is available for transportation. Discussed increasing dose 1 unit each day her fasting CBG is >150. Education regarding syringes, injections, vials provided via pharmacy consult in office today. I have asked patient to apply for MAP with intention of transitioning to Lantus for once daily administration. Continue metformin but at half dose to avoid GI side effects. Follow up in 1-2 weeks to assess control. Patient with poor insight, but discuss risks including nonhealing wounds, neuropathy, renal damage.

## 2011-07-31 NOTE — Patient Instructions (Signed)
[facility] Start taking your metformin at half tablet twice daily. Stop taking glipizide. Will start insulin with NPH 5 units twice daily. Check fasting blood sugar in the morning. If >150 then increase your insulin by 1 unit each day. Apply for MAP program at [facility] (Medication assistance program) so you can switch insulin to once daily lantus. Make an appointment for follow up with pharmacy clinic/Dr Koval or Dr Cristal Ford in 1-2 weeks.

## 2011-07-31 NOTE — Progress Notes (Signed)
  Subjective:    Patient ID: Kristy [name], female    DOB: 11-21-1965, 46 y.o.   MRN: [id number]  HPI  1. DM. Patient again ran out of meds, no metformin or glipizide in past 3 weeks. Barriers include dependence upon daughter for money/transportation even though she has orange card and gets at [facility]. Last BS taken 3 days ago was 300-400. Feels fatigued, poor appetite, polyuria but is drinking lots of water. Has 2 glucometers at home and states she is able to check CBGs when needed.   Review of Systems See HPI otherwise negative. Does not smoke.     Objective:   Physical Exam  Vitals reviewed. Constitutional: She is oriented to person, place, and time.       Appears fatigued  HENT:  Head: Normocephalic and atraumatic.  Mouth/Throat: No oropharyngeal exudate.       Lips dry  Eyes: EOM are normal. Pupils are equal, round, and reactive to light.  Cardiovascular: Normal rate, regular rhythm, normal heart sounds and intact distal pulses.   No murmur heard. Pulmonary/Chest: Effort normal and breath sounds normal. No respiratory distress. She has no wheezes. She has no rales.  Abdominal: Soft. There is no tenderness.  Musculoskeletal: She exhibits no edema.       Bilateral feet light touch sensation intact. No ulcers or wounds. Pulses 2+ DP bilaterally.  Neurological: She is alert and oriented to person, place, and time. She exhibits normal muscle tone. Coordination normal.  Skin: Skin is dry.  Psychiatric: She has a normal mood and affect.          Assessment & Plan:

## 2011-08-01 DIAGNOSIS — B3731 Acute candidiasis of vulva and vagina: Secondary | ICD-10-CM | POA: Insufficient documentation

## 2011-08-01 DIAGNOSIS — B373 Candidiasis of vulva and vagina: Secondary | ICD-10-CM | POA: Insufficient documentation

## 2011-08-01 NOTE — Assessment & Plan Note (Signed)
Yeast on UA from dirty specimen. Patient asymptomatic except for some discharge. Will treat for vaginitis with fluconazole 150mg  x1 in setting of hyperglycemia.

## 2011-08-05 ENCOUNTER — Encounter (HOSPITAL_COMMUNITY): Payer: Self-pay | Admitting: *Deleted

## 2011-08-05 ENCOUNTER — Inpatient Hospital Stay (HOSPITAL_COMMUNITY)
Admission: AD | Admit: 2011-08-05 | Discharge: 2011-08-05 | Disposition: A | Discharge status: Home / Self Care | Payer: No Typology Code available for payment source | Source: Ambulatory Visit | Attending: Obstetrics & Gynecology | Admitting: Obstetrics & Gynecology

## 2011-08-05 DIAGNOSIS — N764 Abscess of vulva: Secondary | ICD-10-CM | POA: Insufficient documentation

## 2011-08-05 HISTORY — DX: Calculus of kidney: N20.0

## 2011-08-05 LAB — POCT PREGNANCY, URINE: Preg Test, Ur: NEGATIVE

## 2011-08-05 LAB — URINALYSIS, ROUTINE W REFLEX MICROSCOPIC
Nitrite: NEGATIVE
Specific Gravity, Urine: 1.015 (ref 1.005–1.030)
Urobilinogen, UA: 0.2 mg/dL (ref 0.0–1.0)
pH: 6 (ref 5.0–8.0)

## 2011-08-05 LAB — GLUCOSE, CAPILLARY: Glucose-Capillary: 234 mg/dL — ABNORMAL HIGH (ref 70–99)

## 2011-08-05 LAB — URINE MICROSCOPIC-ADD ON

## 2011-08-05 MED ORDER — DOXYCYCLINE HYCLATE 100 MG PO TABS
100.0000 mg | ORAL_TABLET | Freq: Once | ORAL | Status: AC
  Administered 2011-08-05: 100 mg via ORAL
  Filled 2011-08-05: qty 1

## 2011-08-05 MED ORDER — HYDROCODONE-ACETAMINOPHEN 5-325 MG PO TABS
1.0000 | ORAL_TABLET | Freq: Once | ORAL | Status: AC
  Administered 2011-08-05: 1 via ORAL
  Filled 2011-08-05: qty 1

## 2011-08-05 MED ORDER — HYDROCODONE-ACETAMINOPHEN 5-325 MG PO TABS: 1.0000 | ORAL_TABLET | Freq: Four times a day (QID) | ORAL | Status: AC | PRN

## 2011-08-05 MED ORDER — DOXYCYCLINE HYCLATE 100 MG PO CAPS: 100.0000 mg | ORAL_CAPSULE | Freq: Two times a day (BID) | ORAL | Status: AC

## 2011-08-05 NOTE — Progress Notes (Signed)
Pt reports vaginal boil x 1 wk.

## 2011-08-05 NOTE — ED Provider Notes (Signed)
History     Chief Complaint  Patient presents with  . Recurrent Skin Infections   HPICarol [name] 46 y.o. presents with labial abscess.  She is a patient of [facility].  Has diabetes.  Checked her blood sugar this morning and it was 280, took her insulin as prescribed.  States she has not checked it tonight because she was here.  Plans to take it when she gets home.  Is eager to get evaluation so she doesn't miss the last bus home.  Reassured her we would have her completed by then.    Past Medical History  Diagnosis Date  . Diabetes mellitus type II   . Hyperlipidemia   . Chronic back pain   . Anemia   . Kidney stones     Past Surgical History  Procedure Date  . Cesarean section   . Tubal ligation   . Kidney stone surgery     Family History  Problem Relation Age of Onset  . Adopted: Yes    History  Substance Use Topics  . Smoking status: Current Some Day Smoker -- 0.2 packs/day    Types: Cigarettes  . Smokeless tobacco: Not on file   Comment: one cigarette daily  . Alcohol Use: No    Allergies:  Allergies  Allergen Reactions  . Oxycodone Itching    Prescriptions prior to admission  Medication Sig Dispense Refill  . amitriptyline (ELAVIL) 100 MG tablet Take 50 mg by mouth at bedtime.        Marland Kitchen aspirin 81 MG tablet Take 81 mg by mouth daily.        . ferrous sulfate 325 (65 FE) MG tablet Take 325 mg by mouth daily with breakfast.        . glipiZIDE (GLUCOTROL) 10 MG tablet Take 1 tablet (10 mg total) by mouth daily with lunch. And Half tab (5mg ) with dinner.  60 tablet  3  . glucose blood (ONE TOUCH ULTRA TEST) test strip Use as instructed  100 each  12  . insulin glargine (LANTUS SOLOSTAR) 100 UNIT/ML injection Inject 10 Units into the skin at bedtime.  10 mL  12  . insulin NPH (HUMULIN N) 100 UNIT/ML injection Inject 5 Units into the skin 2 (two) times daily.  10 mL  12  . Insulin Syringe-Needle U-100 (BD INSULIN SYRINGE ULTRAFINE) 31G X 5/16" 1 ML MISC 1 Device by  Does not apply route 2 (two) times daily. Qs for 2 times daily injections (1 box of 100)  100 each  11  . lisinopril (PRINIVIL,ZESTRIL) 10 MG tablet Take 0.5 tablets (5 mg total) by mouth daily.  30 tablet  11  . metFORMIN (GLUCOPHAGE) 1000 MG tablet Take 0.5 tablets (500 mg total) by mouth 2 (two) times daily with a meal.  60 tablet  3  . simvastatin (ZOCOR) 40 MG tablet Take 1 tablet (40 mg total) by mouth at bedtime.  30 tablet  3  . traMADol (ULTRAM) 50 MG tablet Take one to two tabs tid prn for back pain  90 tablet  1    Review of Systems  Constitutional: Negative.   Genitourinary:       Left labial abscess   Physical Exam   Blood pressure 130/80, pulse 86, temperature 98.4 F (36.9 C), temperature source Oral, resp. rate 18, height 4\' 11"  (1.499 m), weight 184 lb 9.6 oz (83.734 kg), last menstrual period 07/23/2011, SpO2 97.00%.  Physical Exam  Constitutional: She is oriented to person, place, and  time. She appears well-developed and well-nourished.  HENT:  Head: Normocephalic.  Neck: Normal range of motion.  Cardiovascular: Normal rate.   Respiratory: Effort normal.  Genitourinary: There is lesion (left labial abcess on the lower part of the labia majora.  It is golf ball size, red, tender and has a head on it.  no oozing .  ) on the left labia.  Neurological: She is alert and oriented to person, place, and time.  Skin: Skin is warm and dry.  Psychiatric: She has a normal mood and affect. Her behavior is normal.   Results for orders placed during the hospital encounter of 08/05/11 (from the past 24 hour(s))  GLUCOSE, CAPILLARY     Status: Abnormal   Collection Time   08/05/11  7:56 PM      Component Value Range   Glucose-Capillary 234 (*) 70 - 99 (mg/dL)   MAU Course  Procedures  MDM Patient unable to get to the pharmacy before it closes.  Will give 1 dose of Doxycycline 100mg  po now and Vicodin 1 tab po now.   Encouraged to fill Rxs tomorrow  Assessment and Plan  A:   Left labial abscess  P:  Instructions to soak in warm bath 2-3 times a day      Rx for Doxycycline for 7 days and Vicodin given      Important to take insulin when she gets home. Return here if worsens or can call her PCP for followup.  KEY,EVE M 08/05/2011, 8:10 PM   Matt Holmes, NP 08/05/11 2017  Matt Holmes, NP 08/05/11 2032

## 2011-08-14 ENCOUNTER — Ambulatory Visit: Payer: No Typology Code available for payment source | Admitting: Pharmacist

## 2011-08-24 ENCOUNTER — Other Ambulatory Visit: Payer: Self-pay | Admitting: Family Medicine

## 2011-08-24 ENCOUNTER — Ambulatory Visit: Payer: No Typology Code available for payment source | Admitting: Pharmacist

## 2011-08-24 DIAGNOSIS — E669 Obesity, unspecified: Secondary | ICD-10-CM

## 2011-08-24 MED ORDER — GLIPIZIDE 10 MG PO TABS: 10.0000 mg | ORAL_TABLET | Freq: Every day | ORAL | Status: DC

## 2011-08-24 MED ORDER — INSULIN GLARGINE 100 UNIT/ML ~~LOC~~ SOLN: 10.0000 [IU] | Freq: Every day | SUBCUTANEOUS | Status: DC

## 2011-08-24 MED ORDER — PANTOPRAZOLE SODIUM 20 MG PO TBEC: 20.0000 mg | DELAYED_RELEASE_TABLET | Freq: Every day | ORAL | Status: DC

## 2011-08-26 ENCOUNTER — Telehealth: Payer: Self-pay | Admitting: *Deleted

## 2011-08-26 NOTE — Telephone Encounter (Signed)
Received call from Crystal of  [facility] . States she received RX from Dr. Cristal Ford for Protonix  20 mg . However [employer] will only provide Protonix 40 mg .  Does MD want to increase the strength or prescribe something else  ? Also a med list was sent to [employer]  and Actos is not on the list . Is she suppose to be on this ? Call back # [phone]

## 2011-08-27 NOTE — Telephone Encounter (Signed)
Called back crystal at [facility]. Left VM. Will be ok to prescribe protonix at 40mg  daily and would like to discontinue the actos prescription since we started insulin. Advised to return call if questions.

## 2011-09-02 ENCOUNTER — Ambulatory Visit (INDEPENDENT_AMBULATORY_CARE_PROVIDER_SITE_OTHER): Payer: No Typology Code available for payment source | Admitting: Family Medicine

## 2011-09-02 ENCOUNTER — Encounter: Payer: Self-pay | Admitting: Family Medicine

## 2011-09-02 VITALS — BP 122/88 | HR 72 | Temp 97.6°F | Ht 59.0 in | Wt 187.0 lb

## 2011-09-02 DIAGNOSIS — R809 Proteinuria, unspecified: Secondary | ICD-10-CM

## 2011-09-02 DIAGNOSIS — E119 Type 2 diabetes mellitus without complications: Secondary | ICD-10-CM

## 2011-09-02 DIAGNOSIS — E1165 Type 2 diabetes mellitus with hyperglycemia: Secondary | ICD-10-CM

## 2011-09-02 DIAGNOSIS — E669 Obesity, unspecified: Secondary | ICD-10-CM

## 2011-09-02 DIAGNOSIS — N058 Unspecified nephritic syndrome with other morphologic changes: Secondary | ICD-10-CM

## 2011-09-02 DIAGNOSIS — E785 Hyperlipidemia, unspecified: Secondary | ICD-10-CM

## 2011-09-02 DIAGNOSIS — IMO0001 Reserved for inherently not codable concepts without codable children: Secondary | ICD-10-CM

## 2011-09-02 DIAGNOSIS — K219 Gastro-esophageal reflux disease without esophagitis: Secondary | ICD-10-CM

## 2011-09-02 LAB — POCT UA - MICROALBUMIN: Albumin/Creatinine Ratio, Urine, POC: ABNORMAL

## 2011-09-02 NOTE — Patient Instructions (Signed)
[facility] IF you have any problems, give me a call. Make an appointment in 2-3 weeks for med check. Will make eye doctor referal. Limit your advil to once or twice weekly. Ok to try tylenol (acetaminophen) for headaches or aches/pains. It is safe.

## 2011-09-02 NOTE — Assessment & Plan Note (Signed)
Continue ACEi for microalbuminuria as pt has also been noncompliant but plans to pick up at [facility] today.

## 2011-09-02 NOTE — Assessment & Plan Note (Signed)
LDL greatly above goal secondary to noncompliance. Will restart simvastatin (available through [facility]) and recheck FLP in 2-3 months.

## 2011-09-02 NOTE — Progress Notes (Signed)
  Subjective:    Patient ID: Kristy [name], female    DOB: Dec 11, 1965, 46 y.o.   MRN: [id number]  HPI  1. DM. Patient taking NPH 9 u BID. Overall has felt better, less fatigued since initiating insulin. Has still not been to [facility] to pick up other medications, but plans to do this today. We discussed our plan to transition to lantus 10 u daily and DC NPH whenever she gets this medication. I have sent paperwork to [facility] program last week. OK to resume metformin, but will DC actos for now. Patient having improvement of CBGs, with values 100-200. She sometimes gets symptoms of hypoglycemia-tingling in face, hands and she eats a snack. Lowest measurement was 70. She has done ok with injections, but these are sometimes painful in stomach so she uses arm. She did not bring equipment with her today to demonstrate technique. Denies fatigue, polyuria, wounds, vision loss.  2. Headaches. She has headaches regularly, worse past several weeks. Sometimes mild nausea. Starts behind eye to occiput then generalizes at times. Usually R>L, throbbing pain. Using motrin nearly every day of the week.   3. GERD. [facility] pharmacy has available protonix 40mg , so I have sent rx in. Patient will pick up today.   Review of Systems See HPI otherwise negative. Pt is heading to [place] office for orange card paperwork today.    Objective:   Physical Exam  Vitals reviewed. Constitutional: She is oriented to person, place, and time. She appears well-developed and well-nourished. No distress.  HENT:  Head: Normocephalic and atraumatic.  Mouth/Throat: Oropharynx is clear and moist.  Eyes: Conjunctivae and EOM are normal. Pupils are equal, round, and reactive to light.  Cardiovascular: Normal rate, regular rhythm and normal heart sounds.   Pulmonary/Chest: Effort normal and breath sounds normal. No respiratory distress. She has no wheezes.  Musculoskeletal: She exhibits no edema and no tenderness.  Neurological: She is alert and oriented to  person, place, and time. No cranial nerve deficit. She exhibits normal muscle tone. Coordination normal.  Psychiatric: She has a normal mood and affect.       Assessment & Plan:

## 2011-09-02 NOTE — Assessment & Plan Note (Signed)
New rx sent to [facility] according to availability.

## 2011-09-02 NOTE — Assessment & Plan Note (Signed)
CBGs showing vast improvement. Plan to change to daily lantus to avoid extra injections, available at [facility] once patient gets transportation. Will continue on metformin and get refill at pharmacy. Recheck A1c in 2 months, suspect vast improvement on insulin.

## 2011-09-14 ENCOUNTER — Other Ambulatory Visit: Payer: Self-pay | Admitting: Family Medicine

## 2011-09-14 DIAGNOSIS — E785 Hyperlipidemia, unspecified: Secondary | ICD-10-CM

## 2011-09-14 MED ORDER — ATORVASTATIN CALCIUM 80 MG PO TABS: 80.0000 mg | ORAL_TABLET | Freq: Every day | ORAL | Status: DC

## 2011-09-24 ENCOUNTER — Encounter: Payer: Self-pay | Admitting: Family Medicine

## 2011-09-24 ENCOUNTER — Ambulatory Visit (INDEPENDENT_AMBULATORY_CARE_PROVIDER_SITE_OTHER): Payer: Self-pay | Admitting: Family Medicine

## 2011-09-24 VITALS — BP 110/82 | HR 78 | Temp 98.3°F | Ht 59.0 in | Wt 198.0 lb

## 2011-09-24 DIAGNOSIS — R519 Headache, unspecified: Secondary | ICD-10-CM | POA: Insufficient documentation

## 2011-09-24 DIAGNOSIS — R51 Headache: Secondary | ICD-10-CM | POA: Insufficient documentation

## 2011-09-24 DIAGNOSIS — E669 Obesity, unspecified: Secondary | ICD-10-CM

## 2011-09-24 DIAGNOSIS — E119 Type 2 diabetes mellitus without complications: Secondary | ICD-10-CM

## 2011-09-24 DIAGNOSIS — E785 Hyperlipidemia, unspecified: Secondary | ICD-10-CM

## 2011-09-24 DIAGNOSIS — M549 Dorsalgia, unspecified: Secondary | ICD-10-CM

## 2011-09-24 DIAGNOSIS — G8929 Other chronic pain: Secondary | ICD-10-CM

## 2011-09-24 MED ORDER — ROSUVASTATIN CALCIUM 10 MG PO TABS: 10.0000 mg | ORAL_TABLET | Freq: Every day | ORAL | Status: DC

## 2011-09-24 MED ORDER — QUINAPRIL HCL 5 MG PO TABS: 5.0000 mg | ORAL_TABLET | Freq: Every day | ORAL | Status: DC

## 2011-09-24 MED ORDER — AMITRIPTYLINE HCL 100 MG PO TABS: 50.0000 mg | ORAL_TABLET | Freq: Every day | ORAL | Status: DC

## 2011-09-24 NOTE — Assessment & Plan Note (Signed)
Increase therapy to crestor 10mg  according to supply at [facility] and uncontrolled LDL >200.

## 2011-09-24 NOTE — Patient Instructions (Signed)
I will call pharmacy about your cholesterol medicine. Keep a blood sugar log. Write down blood sugar and time, date, when you eat Good fasting <120. Should never be >200. Call Dr. Gerilyn Pilgrim about nutrition therapy. Fill out your headache log and bring in next month. Make appointment in one month.   Cholesterol Cholesterol is a type of fat. Your body needs a small amount of cholesterol, but too much can cause health problems. Certain problems include heart attacks, strokes, and not enough blood flow to your heart, brain, kidneys, or feet. You get cholesterol in 2 ways:  Naturally.   By eating certain foods.  HOME CARE  Eat a low-fat diet:   Eat less eggs, whole dairy products (whole milk, cheese, and butter), fatty meats, and fried foods.   Eat more fruits, vegetables, whole-wheat breads, lean chicken, and fish.   Follow your exercise program as told by your doctor.   Keep your weight at a healthy level. Talk to your doctor about what is right for you.   Only take medicine as told by your doctor.   Get your cholesterol checked once a year or as told by your doctor.  MAKE SURE YOU:  Understand these instructions.   Will watch your condition.   Will get help right away if you are not doing well or get worse.  Document Released: 10/09/2008 Document Revised: 03/25/2011 Document Reviewed: 10/09/2008 [facility] Patient Information 2012 [place], [place].

## 2011-09-24 NOTE — Assessment & Plan Note (Signed)
Nutrition referral. Advised increased activity.

## 2011-09-24 NOTE — Assessment & Plan Note (Addendum)
CBG control improved per patient. Continue regimen. GIven CBG log for daily measurements. Will bring into next visit so that we can make some educated adjustments as needed and check A1c. Consider stopping glipizide once lantus therapy is maximized. Ophtho referral next visit. Discussed with [facility] changes of ACEi and statin according to availability.

## 2011-09-24 NOTE — Assessment & Plan Note (Signed)
Headache log given to patient. To document associated symptoms, type, severity and meds. Seems like migraine based on previous assessments but patient Agrees to bring back for discussion in one month.

## 2011-09-24 NOTE — Progress Notes (Signed)
  Subjective:    Patient ID: Kristy [name], female    DOB: Jul 30, 1965, 46 y.o.   MRN: [id number]  HPI  1. Diabetes. Patient states her control is "good." Does not bring log. When asked about specific CBGs recalls the highest recently was 218 after a meal. Denies any hypoglycemic episodes, but she notices hunger when less than 100. States her goal is between 100-200. Is currently taking Lantus 10 units daily, metformin and Glucotrol. Is not taking her prescribed ACE inhibitor. Stomach soreness improved with once daily injections. Denies syncope, polyuria, polydipsia, the ulcers.   2. HLD. Not currently taking a statin that she knows of. [facility].   3. Obesity. Has gained 11 pounds since last visit. Patient endorses poor appetite so does not believe dietary indiscretions are complained that she is walking much less after a recent move. Seems agreeable to nutrition referral.  4. Headaches. None today. Would like to address this but unable to give specifics about frequency. We agree to start headache log and follow up next visit. Denies eye pain.   Review of Systems See HPI otherwise negative.     Objective:   Physical Exam  Vitals reviewed. Constitutional: She is oriented to person, place, and time. She appears well-developed and well-nourished. No distress.  HENT:  Head: Normocephalic and atraumatic.  Mouth/Throat: Oropharynx is clear and moist.  Eyes: EOM are normal. Pupils are equal, round, and reactive to light.  Neck: Neck supple.  Cardiovascular: Normal rate, regular rhythm, normal heart sounds and intact distal pulses.   No murmur heard. Pulmonary/Chest: Effort normal and breath sounds normal.  Musculoskeletal: She exhibits no edema and no tenderness.  Neurological: She is alert and oriented to person, place, and time. No cranial nerve deficit.  Psychiatric: She has a normal mood and affect.       Assessment & Plan:

## 2011-09-24 NOTE — Assessment & Plan Note (Signed)
Continue elavil qhs and tramadol prn. [facility] rx called to [facility].

## 2011-10-05 ENCOUNTER — Other Ambulatory Visit: Payer: Self-pay | Admitting: Family Medicine

## 2011-10-05 DIAGNOSIS — E669 Obesity, unspecified: Secondary | ICD-10-CM

## 2011-10-05 MED ORDER — GLIPIZIDE 10 MG PO TABS: 10.0000 mg | ORAL_TABLET | Freq: Every day | ORAL | Status: DC

## 2011-10-20 ENCOUNTER — Ambulatory Visit (INDEPENDENT_AMBULATORY_CARE_PROVIDER_SITE_OTHER): Payer: No Typology Code available for payment source | Admitting: Family Medicine

## 2011-10-20 ENCOUNTER — Encounter: Payer: Self-pay | Admitting: Family Medicine

## 2011-10-20 VITALS — BP 108/64 | HR 80 | Temp 97.8°F | Ht 59.0 in | Wt 199.0 lb

## 2011-10-20 DIAGNOSIS — N058 Unspecified nephritic syndrome with other morphologic changes: Secondary | ICD-10-CM

## 2011-10-20 DIAGNOSIS — E785 Hyperlipidemia, unspecified: Secondary | ICD-10-CM

## 2011-10-20 DIAGNOSIS — IMO0001 Reserved for inherently not codable concepts without codable children: Secondary | ICD-10-CM

## 2011-10-20 DIAGNOSIS — R809 Proteinuria, unspecified: Secondary | ICD-10-CM

## 2011-10-20 DIAGNOSIS — E669 Obesity, unspecified: Secondary | ICD-10-CM

## 2011-10-20 DIAGNOSIS — E8881 Metabolic syndrome: Secondary | ICD-10-CM | POA: Insufficient documentation

## 2011-10-20 DIAGNOSIS — E1165 Type 2 diabetes mellitus with hyperglycemia: Secondary | ICD-10-CM

## 2011-10-20 DIAGNOSIS — E119 Type 2 diabetes mellitus without complications: Secondary | ICD-10-CM

## 2011-10-20 DIAGNOSIS — M549 Dorsalgia, unspecified: Secondary | ICD-10-CM

## 2011-10-20 DIAGNOSIS — E1169 Type 2 diabetes mellitus with other specified complication: Secondary | ICD-10-CM

## 2011-10-20 DIAGNOSIS — G8929 Other chronic pain: Secondary | ICD-10-CM

## 2011-10-20 DIAGNOSIS — R51 Headache: Secondary | ICD-10-CM

## 2011-10-20 DIAGNOSIS — M545 Low back pain: Secondary | ICD-10-CM | POA: Insufficient documentation

## 2011-10-20 LAB — POCT GLYCOSYLATED HEMOGLOBIN (HGB A1C): Hemoglobin A1C: 10.2

## 2011-10-20 MED ORDER — AMITRIPTYLINE HCL 100 MG PO TABS: 50.0000 mg | ORAL_TABLET | Freq: Every day | ORAL | Status: DC

## 2011-10-20 MED ORDER — CYCLOBENZAPRINE HCL 5 MG PO TABS: ORAL_TABLET | ORAL | Status: DC

## 2011-10-20 MED ORDER — TRAMADOL HCL 50 MG PO TABS: ORAL_TABLET | ORAL | Status: DC

## 2011-10-20 NOTE — Assessment & Plan Note (Signed)
Control improved according to patient. Will recommend she restart TCA, can obtain through [facility] (not available at [facility]). Advised to bring headache log if she wished to discuss in future.

## 2011-10-20 NOTE — Assessment & Plan Note (Signed)
A1c is 10.2 today, much improved from >14. Patient is pleased with new control, will follow up in one month and discuss increase in lantus by 2 units. Continue glucotrol with meals and metformin for now. [facility] to determine if ACEi was filled-they had accupril on formulary last month. Ophthalmology exam scheduled.

## 2011-10-20 NOTE — Progress Notes (Signed)
  Subjective:    Patient ID: Kristy [name], female    DOB: Dec 13, 1965, 46 y.o.   MRN: [id number]  HPI  1. Low back pain. Present off and on for years. No trauma. Located in bilateral lumbar area, sometimes greater on the right and radiates to lateral thighs. No weakness or numbness. Pain worse during the daytime and in the morning. She had lumbar films showing facet arthropathy in July 2012. Tried PT once two years ago, but did not continue. Is using tylenol for pain currently, up to 4 gm daily. Has not gotten her tramadol filled at [facility].   2. Headaches. Have improved in frequency. Did not bring in her headache log today. Has not been taking amitryptilene, hasn't been to the pharmacy.   3. DM. Blood sugars vary 140-210. No low blood sugars <140. Is pleased with this improvement. Taking lantus 10 unit qhs, glucotrol and metformin.  Has ophtho appt today. No foot ulcers/wounds.   Review of Systems See HPI otherwise negative.     Objective:   Physical Exam  Vitals reviewed. Constitutional: She is oriented to person, place, and time. She appears well-developed and well-nourished. No distress.  HENT:  Head: Normocephalic and atraumatic.  Mouth/Throat: Oropharynx is clear and moist.  Eyes: EOM are normal.  Cardiovascular: Normal rate, regular rhythm, normal heart sounds and intact distal pulses.   No murmur heard. Pulmonary/Chest: Effort normal.  Musculoskeletal: Normal range of motion. She exhibits no edema and no tenderness.       Normal flexion and extension, mild pain with right lumbar flexion, facet loading test. Negative SL, neg FABER. No ttp in hips, lumbar spinous processes. Lumbar paraspinal musculature is hypertonic. No trigger points. No pain at SI joint.   Neurological: She is alert and oriented to person, place, and time. No cranial nerve deficit. Coordination normal.  Skin: No rash noted.  Psychiatric: She has a normal mood and affect.      Assessment & Plan:

## 2011-10-20 NOTE — Assessment & Plan Note (Signed)
Have left message with [facility]. Patient needs her accupril or alternative low dose ACEi according to forumulary.

## 2011-10-20 NOTE — Assessment & Plan Note (Addendum)
Started on crestor since last check. Will recheck LDL at next visit in 1-2 months.

## 2011-10-20 NOTE — Patient Instructions (Addendum)
Your back muscles seem tight. Also may have some arthritis in back joints. Lets try physical therapy and back strengthening. You may take muscle relaxer at night for pain. You may take tramadol for pain in addition to tylenol as needed. If you pain does not improve, will refer to sports medicine clinic. I will call about your medications (should be on amitryptilene, accupril).  Back Exercises Back exercises help treat and prevent back injuries. The goal of back exercises is to increase the strength of your abdominal and back muscles and the flexibility of your back. These exercises should be started when you no longer have back pain. Back exercises include:  Pelvic Tilt. Lie on your back with your knees bent. Tilt your pelvis until the lower part of your back is against the floor. Hold this position 5 to 10 sec and repeat 5 to 10 times.   Knee to Chest. Pull first 1 knee up against your chest and hold for 20 to 30 seconds, repeat this with the other knee, and then both knees. This may be done with the other leg straight or bent, whichever feels better.   Sit-Ups or Curl-Ups. Bend your knees 90 degrees. Start with tilting your pelvis, and do a partial, slow sit-up, lifting your trunk only 30 to 45 degrees off the floor. Take at least 2 to 3 seconds for each sit-up. Do not do sit-ups with your knees out straight. If partial sit-ups are difficult, simply do the above but with only tightening your abdominal muscles and holding it as directed.   Hip-Lift. Lie on your back with your knees flexed 90 degrees. Push down with your feet and shoulders as you raise your hips a couple inches off the floor; hold for 10 seconds, repeat 5 to 10 times.   Back arches. Lie on your stomach, propping yourself up on bent elbows. Slowly press on your hands, causing an arch in your low back. Repeat 3 to 5 times. Any initial stiffness and discomfort should lessen with repetition over time.   Shoulder-Lifts. Lie face down  with arms beside your body. Keep hips and torso pressed to floor as you slowly lift your head and shoulders off the floor.  Do not overdo your exercises, especially in the beginning. Exercises may cause you some mild back discomfort which lasts for a few minutes; however, if the pain is more severe, or lasts for more than 15 minutes, do not continue exercises until you see your caregiver. Improvement with exercise therapy for back problems is slow.  See your caregivers for assistance with developing a proper back exercise program. Document Released: 08/20/2004 Document Revised: 07/02/2011 Document Reviewed: 07/13/2005 [facility] Patient Information 2012 [place], [place].

## 2011-10-20 NOTE — Assessment & Plan Note (Signed)
Acute on chronic, likely related to facet arthropathy and obesity/back spasm. Recommend back strengthening exercise, will make PT referral. Recommend she resume her tramadol prn and have given flexeril 5mg  to use at night only. F/u in one month.

## 2011-12-24 ENCOUNTER — Ambulatory Visit: Payer: No Typology Code available for payment source | Admitting: Family Medicine

## 2012-01-05 ENCOUNTER — Ambulatory Visit: Payer: No Typology Code available for payment source | Admitting: Family Medicine

## 2012-01-11 ENCOUNTER — Ambulatory Visit: Payer: No Typology Code available for payment source | Admitting: Family Medicine

## 2012-01-19 ENCOUNTER — Other Ambulatory Visit: Payer: Self-pay | Admitting: Family Medicine

## 2012-01-19 DIAGNOSIS — E669 Obesity, unspecified: Secondary | ICD-10-CM

## 2012-01-19 MED ORDER — GLIPIZIDE 10 MG PO TABS: 10.0000 mg | ORAL_TABLET | Freq: Every day | ORAL | Status: DC

## 2012-01-27 ENCOUNTER — Ambulatory Visit: Payer: No Typology Code available for payment source | Admitting: Family Medicine

## 2012-02-01 ENCOUNTER — Other Ambulatory Visit: Payer: Self-pay | Admitting: Family Medicine

## 2012-02-01 DIAGNOSIS — E1169 Type 2 diabetes mellitus with other specified complication: Secondary | ICD-10-CM

## 2012-02-01 MED ORDER — INSULIN GLARGINE 100 UNIT/ML ~~LOC~~ SOLN: 10.0000 [IU] | Freq: Every day | SUBCUTANEOUS | Status: DC

## 2012-03-16 ENCOUNTER — Encounter: Payer: Self-pay | Admitting: Family Medicine

## 2012-03-16 ENCOUNTER — Ambulatory Visit (INDEPENDENT_AMBULATORY_CARE_PROVIDER_SITE_OTHER): Payer: No Typology Code available for payment source | Admitting: Family Medicine

## 2012-03-16 VITALS — BP 125/77 | HR 83 | Temp 99.0°F | Wt 191.3 lb

## 2012-03-16 DIAGNOSIS — E785 Hyperlipidemia, unspecified: Secondary | ICD-10-CM

## 2012-03-16 DIAGNOSIS — E669 Obesity, unspecified: Secondary | ICD-10-CM

## 2012-03-16 DIAGNOSIS — M545 Low back pain, unspecified: Secondary | ICD-10-CM

## 2012-03-16 DIAGNOSIS — N058 Unspecified nephritic syndrome with other morphologic changes: Secondary | ICD-10-CM

## 2012-03-16 DIAGNOSIS — IMO0001 Reserved for inherently not codable concepts without codable children: Secondary | ICD-10-CM

## 2012-03-16 DIAGNOSIS — E119 Type 2 diabetes mellitus without complications: Secondary | ICD-10-CM

## 2012-03-16 DIAGNOSIS — Z720 Tobacco use: Secondary | ICD-10-CM

## 2012-03-16 DIAGNOSIS — F17200 Nicotine dependence, unspecified, uncomplicated: Secondary | ICD-10-CM

## 2012-03-16 DIAGNOSIS — R809 Proteinuria, unspecified: Secondary | ICD-10-CM

## 2012-03-16 DIAGNOSIS — E1129 Type 2 diabetes mellitus with other diabetic kidney complication: Secondary | ICD-10-CM

## 2012-03-16 DIAGNOSIS — G8929 Other chronic pain: Secondary | ICD-10-CM

## 2012-03-16 DIAGNOSIS — M549 Dorsalgia, unspecified: Secondary | ICD-10-CM

## 2012-03-16 LAB — POCT GLYCOSYLATED HEMOGLOBIN (HGB A1C): Hemoglobin A1C: >14

## 2012-03-16 LAB — GLUCOSE, CAPILLARY: Glucose-Capillary: 315 mg/dL — ABNORMAL HIGH (ref 70–99)

## 2012-03-16 MED ORDER — METFORMIN HCL 1000 MG PO TABS: 1000.0000 mg | ORAL_TABLET | Freq: Two times a day (BID) | ORAL | Status: DC

## 2012-03-16 MED ORDER — INSULIN GLARGINE 100 UNIT/ML ~~LOC~~ SOLN: 20.0000 [IU] | Freq: Every day | SUBCUTANEOUS | Status: DC

## 2012-03-16 NOTE — Assessment & Plan Note (Signed)
Due for lipids after starting statin, check fasting levels at f/u in next month.

## 2012-03-16 NOTE — Progress Notes (Signed)
  Subjective:    Patient ID: Kristy [name], female    DOB: January 22, 1966, 46 y.o.   MRN: [id number]  HPI  1. DM. Poorly controlled. Patient endorses poor dietary choices and sedentary lifestyle. Has not followed up in 6 months, since she has been gone to [place] seeing family. Is taking Lantus 10 units each bedtime on most nights, metformin twice daily, and probably taking Glucotrol though she is unsure. Her orange card has expired. Her glucose meter is broken, and she plans [facility] a new one but has not gotten around to it. Last CBG checked > 1 month ago with ranges 180-300 reported.  She endorses fatigue, and generalized aches and pains. No syncope, nausea or vomiting.  2. HLD. Due for labs. Is taking crestor.  3. Low back pain. Using tylenol and NSAIDs. Plain films show spondylosis last summer. Has not participiated in PT. No weakness, numbness, incontinence, fever, weight loss.  Review of Systems See HPI otherwise negative.  reports that she has been smoking Cigarettes.  She has been smoking about .25 packs per day. She does not have any smokeless tobacco history on file.     Objective:   Physical Exam  Vitals reviewed. Constitutional: She is oriented to person, place, and time. She appears well-developed and well-nourished. No distress.  HENT:  Head: Normocephalic and atraumatic.  Mouth/Throat: Oropharynx is clear and moist.  Eyes: EOM are normal.  Cardiovascular: Normal rate, regular rhythm and normal heart sounds.   No murmur heard. Pulmonary/Chest: Effort normal and breath sounds normal. No respiratory distress.  Abdominal: Soft.  Musculoskeletal: She exhibits no edema and no tenderness.       Normal ambulation.  Neurological: She is alert and oriented to person, place, and time. No cranial nerve deficit. Coordination normal.  Skin: No rash noted.  Psychiatric: She has a normal mood and affect.        Assessment & Plan:

## 2012-03-16 NOTE — Assessment & Plan Note (Signed)
Chronic, lumbar films show mild spondylosis from 2012. Will recommend PT again. Consider increase in elavil vs switch to cymbalta or neurontin based on coverage at next visit. Continue tylenol and motrin prn.

## 2012-03-16 NOTE — Patient Instructions (Addendum)
NIce to see you. Your diabetes is poorly controlled. Increase lantus to 20 units every night. Increase metformin to 1000 mg twice daily. Get your orange card restarted. Make an appointment in 2 weeks for check up.

## 2012-03-16 NOTE — Assessment & Plan Note (Signed)
A1c further deteriorated due to noncompliance and poor follow up. Has not checked CBG >1 month with broken glucometer. She agrees to pick up new one at [facility] this week, increase lantus to 20 units qhs, increase metformin 1000 mg BID. DC glucotrol. F/u in 2 weeks. May change lantus to qam if this increases compliance, does not seem like she is willing to try meal time insulin yet.

## 2012-04-01 ENCOUNTER — Encounter: Payer: Self-pay | Admitting: Family Medicine

## 2012-04-01 ENCOUNTER — Ambulatory Visit (INDEPENDENT_AMBULATORY_CARE_PROVIDER_SITE_OTHER): Payer: Self-pay | Admitting: Family Medicine

## 2012-04-01 VITALS — BP 87/52 | HR 91 | Temp 99.1°F | Wt 192.0 lb

## 2012-04-01 DIAGNOSIS — E8881 Metabolic syndrome: Secondary | ICD-10-CM

## 2012-04-01 DIAGNOSIS — E785 Hyperlipidemia, unspecified: Secondary | ICD-10-CM

## 2012-04-01 DIAGNOSIS — E669 Obesity, unspecified: Secondary | ICD-10-CM

## 2012-04-01 DIAGNOSIS — N058 Unspecified nephritic syndrome with other morphologic changes: Secondary | ICD-10-CM

## 2012-04-01 DIAGNOSIS — E1169 Type 2 diabetes mellitus with other specified complication: Secondary | ICD-10-CM

## 2012-04-01 DIAGNOSIS — M545 Low back pain, unspecified: Secondary | ICD-10-CM

## 2012-04-01 DIAGNOSIS — R809 Proteinuria, unspecified: Secondary | ICD-10-CM

## 2012-04-01 DIAGNOSIS — E1129 Type 2 diabetes mellitus with other diabetic kidney complication: Secondary | ICD-10-CM

## 2012-04-01 DIAGNOSIS — E119 Type 2 diabetes mellitus without complications: Secondary | ICD-10-CM

## 2012-04-01 DIAGNOSIS — IMO0001 Reserved for inherently not codable concepts without codable children: Secondary | ICD-10-CM

## 2012-04-01 LAB — CBC WITH DIFFERENTIAL/PLATELET
Basophils Absolute: 0 10*3/uL (ref 0.0–0.1)
Basophils Relative: 0 % (ref 0–1)
MCHC: 33.2 g/dL (ref 30.0–36.0)
Monocytes Absolute: 0.4 10*3/uL (ref 0.1–1.0)
Neutro Abs: 4.6 10*3/uL (ref 1.7–7.7)
Neutrophils Relative %: 69 % (ref 43–77)
Platelets: 346 10*3/uL (ref 150–400)
RDW: 16.1 % — ABNORMAL HIGH (ref 11.5–15.5)
WBC: 6.6 10*3/uL (ref 4.0–10.5)

## 2012-04-01 LAB — POCT GLYCOSYLATED HEMOGLOBIN (HGB A1C): Hemoglobin A1C: 12.2

## 2012-04-01 LAB — POCT URINALYSIS DIPSTICK
Ketones, UA: NEGATIVE
Protein, UA: NEGATIVE
Spec Grav, UA: 1.02

## 2012-04-01 LAB — COMPREHENSIVE METABOLIC PANEL
ALT: 8 U/L (ref 0–35)
AST: 9 U/L (ref 0–37)
Albumin: 4.1 g/dL (ref 3.5–5.2)
Alkaline Phosphatase: 73 U/L (ref 39–117)
BUN: 9 mg/dL (ref 6–23)
Potassium: 4.1 mEq/L (ref 3.5–5.3)
Sodium: 137 mEq/L (ref 135–145)

## 2012-04-01 LAB — POCT UA - MICROSCOPIC ONLY

## 2012-04-01 MED ORDER — CYCLOBENZAPRINE HCL 5 MG PO TABS: 5.0000 mg | ORAL_TABLET | Freq: Two times a day (BID) | ORAL | Status: AC | PRN

## 2012-04-01 MED ORDER — INSULIN GLARGINE 100 UNIT/ML ~~LOC~~ SOLN: 25.0000 [IU] | Freq: Every day | SUBCUTANEOUS | Status: DC

## 2012-04-01 NOTE — Assessment & Plan Note (Signed)
Started crestor, will follow up LDL today. Re-inforced exercise and diet control. Will likely need higher dose crestor.

## 2012-04-01 NOTE — Addendum Note (Signed)
Addended by: [place], Sollie Vultaggio on: 04/01/2012 02:48 PM   Modules accepted: Orders

## 2012-04-01 NOTE — Patient Instructions (Addendum)
You can take acetaminophen or tylenol for back pain. This is always safe. You can try flexeril for back spasm, might make sleepy. I would like if you decide to try physical therapy again. Please keep track of your blood sugars and bring to next visit.    Back Pain, Adult Back pain is very common. The pain often gets better over time. The cause of back pain is usually not dangerous. Most people can learn to manage their back pain on their own.  HOME CARE   Stay active. Start with short walks on flat ground if you can. Try to walk farther each day.   Do not sit, drive, or stand in one place for more than 30 minutes. Do not stay in bed.   Do not avoid exercise or work. Activity can help your back heal faster.   Be careful when you bend or lift an object. Bend at your knees, keep the object close to you, and do not twist.   Sleep on a firm mattress. Lie on your side, and bend your knees. If you lie on your back, put a pillow under your knees.   Only take medicines as told by your doctor.   Put ice on the injured area.   Put ice in a plastic bag.   Place a towel between your skin and the bag.   Leave the ice on for 15 to 20 minutes, 3 to 4 times a day for the first 2 to 3 days. After that, you can switch between ice and heat packs.   Ask your doctor about back exercises or massage.   Avoid feeling anxious or stressed. Find good ways to deal with stress, such as exercise.  GET HELP RIGHT AWAY IF:   Your pain does not go away with rest or medicine.   Your pain does not go away in 1 week.   You have new problems.   You do not feel well.   The pain spreads into your legs.   You cannot control when you poop (bowel movement) or pee (urinate).   Your arms or legs feel weak or lose feeling (numbness).   You feel sick to your stomach (nauseous) or throw up (vomit).   You have belly (abdominal) pain.   You feel like you may pass out (faint).  MAKE SURE YOU:   Understand  these instructions.   Will watch your condition.   Will get help right away if you are not doing well or get worse.  Document Released: 12/30/2007 Document Revised: 07/02/2011 Document Reviewed: 12/01/2010 [facility] Patient Information 2012 [place], [place].

## 2012-04-01 NOTE — Assessment & Plan Note (Signed)
Still bothersome. Most likely degenerative facet disease or recurrent muscle strain. Discussed using tylenol and ideally would refer to PT, but she prefers to wait until work schedule improved. Will check ESR, CBC to rule out more serious pathology now with pain > 6 months. Continue tylenol, refill flexeril. F/u in one month.

## 2012-04-01 NOTE — Progress Notes (Signed)
  Subjective:    Patient ID: Kristy [name], female    DOB: 28-Mar-1966, 46 y.o.   MRN: [id number]  HPI  1. Low back pain. Intermittent. Some days are pain free, but lately has a R>L bilateral low back pain that is severe, sometimes exacerbated by walking. Does not radiate. Feels like she is being "hit with a baseball bat." Did participate in PT for a period two years ago, but she quit early because couldn't work around her schedule. Sometimes tylenol makes pain go away. Tramadol made her too sleepy. Denies numbness, incontinence, weakness, rash, weight loss, injury.  2. DM. Taking lantus 20 u qhs and metformin increased to 1000mg . Has a glucometer, but checks infrequently and refuses to pull it out today. States her most recent range 167-290. Denies polyuria, polydipsia, fatigue, malaise, weight changes, foot pain.  Lab Results  Component Value Date   HGBA1C 12.2 04/01/2012   3. HLD/metabolic syndrome. Taking crestor. Does not find much time for exercise. No medication side effects. Tries to eat healthy. Wt Readings from Last 3 Encounters:  04/01/12 192 lb (87.091 kg)  03/16/12 191 lb 4.8 oz (86.773 kg)  10/20/11 199 lb (90.266 kg)   Review of Systems See HPI otherwise she reports good energy level, no lightheadedness, no fatigue.  reports that she has been smoking Cigarettes.  She has been smoking about .25 packs per day. She does not have any smokeless tobacco history on file.     Objective:   Physical Exam  Vitals reviewed. Constitutional: She is oriented to person, place, and time. She appears well-developed and well-nourished. No distress.  HENT:  Head: Normocephalic and atraumatic.  Eyes: EOM are normal. Pupils are equal, round, and reactive to light.  Neck: Neck supple.  Cardiovascular: Normal rate, regular rhythm and normal heart sounds.   No murmur heard. Pulmonary/Chest: Effort normal and breath sounds normal. No respiratory distress. She has no wheezes.  Musculoskeletal: She  exhibits no edema and no tenderness.       Normal spine flexion and extension. Pain on right lumbar area with facet loading. Negative bone tenderness. No SI joint tenderness. Normal leg sensation and strength.  Neurological: She is alert and oriented to person, place, and time. No cranial nerve deficit. Coordination normal.  Skin: No rash noted. She is not diaphoretic.  Psychiatric: She has a normal mood and affect.          Assessment & Plan:

## 2012-04-01 NOTE — Assessment & Plan Note (Signed)
Slight improvement of A1c 14 to 12.2 today. No episodes of hypoglycemia. Will continue metformin and increase lantus by 5 units. Strongly advised she bring CBG log to next visit. Consider nutrition referral if her work schedule can accomodate.

## 2012-04-04 ENCOUNTER — Encounter: Payer: Self-pay | Admitting: Family Medicine

## 2012-04-29 ENCOUNTER — Ambulatory Visit (INDEPENDENT_AMBULATORY_CARE_PROVIDER_SITE_OTHER): Payer: Self-pay | Admitting: Family Medicine

## 2012-04-29 ENCOUNTER — Encounter: Payer: Self-pay | Admitting: Family Medicine

## 2012-04-29 VITALS — BP 112/87 | HR 71 | Temp 98.3°F | Ht 59.0 in | Wt 192.0 lb

## 2012-04-29 DIAGNOSIS — M545 Low back pain: Secondary | ICD-10-CM

## 2012-04-29 DIAGNOSIS — N058 Unspecified nephritic syndrome with other morphologic changes: Secondary | ICD-10-CM

## 2012-04-29 DIAGNOSIS — E785 Hyperlipidemia, unspecified: Secondary | ICD-10-CM

## 2012-04-29 DIAGNOSIS — E1169 Type 2 diabetes mellitus with other specified complication: Secondary | ICD-10-CM

## 2012-04-29 DIAGNOSIS — E1165 Type 2 diabetes mellitus with hyperglycemia: Secondary | ICD-10-CM

## 2012-04-29 DIAGNOSIS — Z23 Encounter for immunization: Secondary | ICD-10-CM

## 2012-04-29 DIAGNOSIS — R809 Proteinuria, unspecified: Secondary | ICD-10-CM

## 2012-04-29 DIAGNOSIS — E669 Obesity, unspecified: Secondary | ICD-10-CM

## 2012-04-29 DIAGNOSIS — IMO0001 Reserved for inherently not codable concepts without codable children: Secondary | ICD-10-CM

## 2012-04-29 DIAGNOSIS — E119 Type 2 diabetes mellitus without complications: Secondary | ICD-10-CM

## 2012-04-29 NOTE — Assessment & Plan Note (Signed)
CBGs improved on lantus at 25 units and metformin. Denies any hypoglycemia episodes. Patient is pleased and seems happy to feel a bit better now. Her fasting CBG is 124 today, which should translate to improved A1c when we check again in 1-2 months. Encouraged exercise, weight loss and nutrition counseling. She refused formal nutrition referral, feels her friend is helping in this regard. On ACEi, aspirin, statin. Flu shot today.

## 2012-04-29 NOTE — Patient Instructions (Addendum)
Sounds like your blood sugar is better! Try to check first in the morning if you can once or twice and at bedtime. Bring in your medicines next visit. You can use tylenol or also called acetaminophen up to 650mg  four times daily for your pain. Keep up good work with exercise.   Diabetes and Exercise Regular exercise is important and can help:   Control blood glucose (sugar).  Decrease blood pressure.    Control blood lipids (cholesterol, triglycerides).  Improve overall health. BENEFITS FROM EXERCISE  Improved fitness.  Improved flexibility.  Improved endurance.  Increased bone density.  Weight control.  Increased muscle strength.  Decreased body fat.  Improvement of the body's use of insulin, a hormone.  Increased insulin sensitivity.  Reduction of insulin needs.  Reduced stress and tension.  Helps you feel better. People with diabetes who add exercise to their lifestyle gain additional benefits, including:  Weight loss.  Reduced appetite.  Improvement of the body's use of blood glucose.  Decreased risk factors for heart disease:  Lowering of cholesterol and triglycerides.  Raising the level of good cholesterol (high-density lipoproteins, HDL).  Lowering blood sugar.  Decreased blood pressure. TYPE 1 DIABETES AND EXERCISE  Exercise will usually lower your blood glucose.  If blood glucose is greater than 240 mg/dl, check urine ketones. If ketones are present, do not exercise.  Location of the insulin injection sites may need to be adjusted with exercise. Avoid injecting insulin into areas of the body that will be exercised. For example, avoid injecting insulin into:  The arms when playing tennis.  The legs when jogging. For more information, discuss this with your caregiver.  Keep a record of:  Food intake.  Type and amount of exercise.  Expected peak times of insulin action.  Blood glucose levels. Do this before, during, and after  exercise. Review your records with your caregiver. This will help you to develop guidelines for adjusting food intake and insulin amounts.  TYPE 2 DIABETES AND EXERCISE  Regular physical activity can help control blood glucose.  Exercise is important because it may:  Increase the body's sensitivity to insulin.  Improve blood glucose control.  Exercise reduces the risk of heart disease. It decreases serum cholesterol and triglycerides. It also lowers blood pressure.  Those who take insulin or oral hypoglycemic agents should watch for signs of hypoglycemia. These signs include dizziness, shaking, sweating, chills, and confusion.  Body water is lost during exercise. It must be replaced. This will help to avoid loss of body fluids (dehydration) or heat stroke. Be sure to talk to your caregiver before starting an exercise program to make sure it is safe for you. Remember, any activity is better than none.  Document Released: 10/03/2003 Document Revised: 10/05/2011 Document Reviewed: 01/17/2009 [facility] Patient Information 2013 [place], [place].

## 2012-04-29 NOTE — Assessment & Plan Note (Signed)
On crestor. Check fasting labs next visit.  Lab Results  Component Value Date   LDLCALC 206* 07/31/2011

## 2012-04-29 NOTE — Assessment & Plan Note (Signed)
Improved currently. Mild spondylosis on plain film 7/13. Gave pt generic name acetaminophen to use prn, this should not be hard to find. If symptoms recur will encourage PT referral again. Encourage exercise and weight loss today.

## 2012-04-29 NOTE — Progress Notes (Signed)
  Subjective:    Patient ID: Kristy [name], female    DOB: 08/13/1965, 46 y.o.   MRN: [id number]  HPI  1. DM. CBGs have improved. She is too busy in the am to check, has to be at work at 5:30 so she checks after work at 3 pm usually varies 120-215. Denies hypoglycemic symptoms. Has increased lantus to 25 units daily. The metformin at 1000 mg causes diarrhea sometimes, but she is still taking it currently and plans to continue for now. Her weight is stable. No polyuria, polydipsia. She has a friend who is helping her eat healthier and encouraging her to go the gym with him.   Her fasting CBG is 124 today in clinic.   2. Back pain. Has improved, does flare up intermittently. States she has trouble finding tylenol at the store, but this does help when she takes it. No falls, injuries, worsening.   3. HLD. Taking crestor now. Denies side effects noted. Exercise is walking and contemplating going to the gym.  Review of Systems See HPI otherwise negative.  reports that she has been smoking Cigarettes.  She has been smoking about .25 packs per day. She does not have any smokeless tobacco history on file.     Objective:   Physical Exam  Vitals reviewed. Constitutional: She is oriented to person, place, and time. She appears well-developed and well-nourished. No distress.  HENT:  Head: Normocephalic and atraumatic.  Eyes: EOM are normal.  Neck: Neck supple.  Cardiovascular: Normal rate, regular rhythm and normal heart sounds.   No murmur heard. Pulmonary/Chest: Effort normal and breath sounds normal. No respiratory distress. She has no wheezes. She has no rales.  Abdominal: Soft.  Musculoskeletal: She exhibits no edema and no tenderness.       No back TTP. Normal ambulation.  Neurological: She is alert and oriented to person, place, and time. Coordination normal.  Skin: No rash noted. She is not diaphoretic.  Psychiatric: She has a normal mood and affect.       Assessment & Plan:

## 2012-05-11 ENCOUNTER — Other Ambulatory Visit: Payer: Self-pay | Admitting: Family Medicine

## 2012-05-11 DIAGNOSIS — IMO0001 Reserved for inherently not codable concepts without codable children: Secondary | ICD-10-CM

## 2012-05-11 DIAGNOSIS — E1169 Type 2 diabetes mellitus with other specified complication: Secondary | ICD-10-CM

## 2012-05-11 MED ORDER — INSULIN GLARGINE 100 UNIT/ML ~~LOC~~ SOLN: 25.0000 [IU] | Freq: Every day | SUBCUTANEOUS | Status: DC

## 2012-06-01 ENCOUNTER — Ambulatory Visit: Payer: Self-pay | Admitting: Family Medicine

## 2012-06-06 ENCOUNTER — Encounter: Payer: Self-pay | Admitting: Home Health Services

## 2012-06-08 ENCOUNTER — Encounter: Payer: Self-pay | Admitting: Home Health Services

## 2012-06-28 ENCOUNTER — Ambulatory Visit: Payer: Self-pay | Admitting: Family Medicine

## 2012-07-08 ENCOUNTER — Encounter: Payer: Self-pay | Admitting: Family Medicine

## 2012-07-08 ENCOUNTER — Ambulatory Visit (INDEPENDENT_AMBULATORY_CARE_PROVIDER_SITE_OTHER): Payer: No Typology Code available for payment source | Admitting: Family Medicine

## 2012-07-08 VITALS — BP 125/81 | HR 72 | Temp 98.9°F | Ht 59.0 in | Wt 197.3 lb

## 2012-07-08 DIAGNOSIS — R202 Paresthesia of skin: Secondary | ICD-10-CM

## 2012-07-08 DIAGNOSIS — R809 Proteinuria, unspecified: Secondary | ICD-10-CM

## 2012-07-08 DIAGNOSIS — IMO0001 Reserved for inherently not codable concepts without codable children: Secondary | ICD-10-CM

## 2012-07-08 DIAGNOSIS — R209 Unspecified disturbances of skin sensation: Secondary | ICD-10-CM

## 2012-07-08 DIAGNOSIS — E1165 Type 2 diabetes mellitus with hyperglycemia: Secondary | ICD-10-CM

## 2012-07-08 DIAGNOSIS — E669 Obesity, unspecified: Secondary | ICD-10-CM

## 2012-07-08 LAB — CBC WITH DIFFERENTIAL/PLATELET
Basophils Absolute: 0 10*3/uL (ref 0.0–0.1)
Eosinophils Relative: 1 % (ref 0–5)
HCT: 34.4 % — ABNORMAL LOW (ref 36.0–46.0)
Lymphocytes Relative: 31 % (ref 12–46)
Lymphs Abs: 1.7 10*3/uL (ref 0.7–4.0)
MCV: 72 fL — ABNORMAL LOW (ref 78.0–100.0)
Neutro Abs: 3.4 10*3/uL (ref 1.7–7.7)
Platelets: 353 10*3/uL (ref 150–400)
RBC: 4.78 MIL/uL (ref 3.87–5.11)
RDW: 15.8 % — ABNORMAL HIGH (ref 11.5–15.5)
WBC: 5.6 10*3/uL (ref 4.0–10.5)

## 2012-07-08 LAB — POCT GLYCOSYLATED HEMOGLOBIN (HGB A1C): Hemoglobin A1C: 8.4

## 2012-07-08 MED ORDER — VITAMIN B-12 1000 MCG PO TABS: 1000.0000 ug | ORAL_TABLET | Freq: Every day | ORAL | Status: DC

## 2012-07-08 NOTE — Assessment & Plan Note (Signed)
Much improved control with med compliance. Will continue current therapy and f/u in 2-3 months. UTD on ophthalmology, on ACEi, aspirin. Foot exam normal. Suspicious for B12 deficiency with paresthesia and long term metformin use. Encouraged physical exercise and consider nutrition counseling after holidays, she has been resistant due to work schedule in the past.

## 2012-07-08 NOTE — Progress Notes (Signed)
  Subjective:    Patient ID: Kristy [name], female    DOB: [birth year], 46 y.o.   MRN: [id number]  HPI  1. DM2. Reports improved glucose values. Her A1c reflects this with great improvement from >12 to 8.4 today. Denies recent hypoglycemic events and feels good about current regimen. Denies foot pain, ulcers, wounds. Completed her visual screening.   2. Tobacco abuse. Quit smoking few months ago without assistance. Plans to remain off cigarettes.  3. Left thigh numbness. She felt a numbness in her thigh last week lasting for 2 days that resolved spontaneously. Felt a similar numbness/warmth in left foot dorsum intermittently. Denies injury, swelling, rash, current back pain, falls, weakness, incontinence.   Review of Systems See HPI otherwise negative.  reports that she has been smoking Cigarettes.  She has been smoking about .25 packs per day. She does not have any smokeless tobacco history on file.     Objective:   Physical Exam  Vitals reviewed. Constitutional: She is oriented to person, place, and time. She appears well-developed and well-nourished. No distress.  HENT:  Head: Normocephalic and atraumatic.  Mouth/Throat: Oropharynx is clear and moist.  Eyes: EOM are normal. Pupils are equal, round, and reactive to light.  Neck: Normal range of motion. Neck supple.  Cardiovascular: Normal rate, regular rhythm and normal heart sounds.   Pulmonary/Chest: Effort normal.  Musculoskeletal: She exhibits no edema and no tenderness.       Sensation to light touch intact in all extremities.  Monofilament test normal in both feet in all distributions.  5/5 LE strength dorsiflexion and knee extension.  Normal gait.   Lymphadenopathy:    She has no cervical adenopathy.  Neurological: She is alert and oriented to person, place, and time.  Skin: She is not diaphoretic.  Psychiatric: She has a normal mood and affect.       Assessment & Plan:

## 2012-07-08 NOTE — Patient Instructions (Addendum)
Great job on getting your A1c improved to 8.4! (down from 12). Also it terrific that you quit smoking! Will check labs. i will call if abnormal or send letter if okay. Start taking vitamin B12. Make appointment for check up in 2-3 months or sooner if needed. Work on exercising as much as possible over the holidays.    Diabetes and Exercise Regular exercise is important and can help:   Control blood glucose (sugar).  Decrease blood pressure.    Control blood lipids (cholesterol, triglycerides).  Improve overall health. BENEFITS FROM EXERCISE  Improved fitness.  Improved flexibility.  Improved endurance.  Increased bone density.  Weight control.  Increased muscle strength.  Decreased body fat.  Improvement of the body's use of insulin, a hormone.  Increased insulin sensitivity.  Reduction of insulin needs.  Reduced stress and tension.  Helps you feel better. People with diabetes who add exercise to their lifestyle gain additional benefits, including:  Weight loss.  Reduced appetite.  Improvement of the body's use of blood glucose.  Decreased risk factors for heart disease:  Lowering of cholesterol and triglycerides.  Raising the level of good cholesterol (high-density lipoproteins, HDL).  Lowering blood sugar.  Decreased blood pressure. TYPE 1 DIABETES AND EXERCISE  Exercise will usually lower your blood glucose.  If blood glucose is greater than 240 mg/dl, check urine ketones. If ketones are present, do not exercise.  Location of the insulin injection sites may need to be adjusted with exercise. Avoid injecting insulin into areas of the body that will be exercised. For example, avoid injecting insulin into:  The arms when playing tennis.  The legs when jogging. For more information, discuss this with your caregiver.  Keep a record of:  Food intake.  Type and amount of exercise.  Expected peak times of insulin action.  Blood glucose  levels. Do this before, during, and after exercise. Review your records with your caregiver. This will help you to develop guidelines for adjusting food intake and insulin amounts.  TYPE 2 DIABETES AND EXERCISE  Regular physical activity can help control blood glucose.  Exercise is important because it may:  Increase the body's sensitivity to insulin.  Improve blood glucose control.  Exercise reduces the risk of heart disease. It decreases serum cholesterol and triglycerides. It also lowers blood pressure.  Those who take insulin or oral hypoglycemic agents should watch for signs of hypoglycemia. These signs include dizziness, shaking, sweating, chills, and confusion.  Body water is lost during exercise. It must be replaced. This will help to avoid loss of body fluids (dehydration) or heat stroke. Be sure to talk to your caregiver before starting an exercise program to make sure it is safe for you. Remember, any activity is better than none.  Document Released: 10/03/2003 Document Revised: 10/05/2011 Document Reviewed: 01/17/2009 [facility] Patient Information 2013 [place], [place].

## 2012-07-08 NOTE — Assessment & Plan Note (Signed)
Exercise and weight loss will be important for long term health. Continue to stress this to patient. F/u in 2 months.

## 2012-07-08 NOTE — Assessment & Plan Note (Addendum)
No sign of spinal etiology on benign exam or by history. Will check CBC, B12, TSH, vit d level. High risk for B12 def with long term metformin, will start 1000 mg daily empirically. Encouraged physical activity. F/u in 2-3 months.

## 2012-07-09 LAB — VITAMIN D 25 HYDROXY (VIT D DEFICIENCY, FRACTURES): Vit D, 25-Hydroxy: 16 ng/mL — ABNORMAL LOW (ref 30–89)

## 2012-07-11 ENCOUNTER — Other Ambulatory Visit: Payer: Self-pay | Admitting: Family Medicine

## 2012-07-11 DIAGNOSIS — D509 Iron deficiency anemia, unspecified: Secondary | ICD-10-CM

## 2012-07-11 MED ORDER — ERGOCALCIFEROL 1.25 MG (50000 UT) PO CAPS: 50000.0000 [IU] | ORAL_CAPSULE | ORAL | Status: DC

## 2012-07-11 NOTE — Progress Notes (Signed)
Spoke with patient about lab results: microcytic anemia is worsened. Advised her to pick up stool cards to rule out GI bleeding. Restart iron sulfate. Also will print vit D replacement to start. She agrees to pick up these things this week. F/u in 1-2 months.

## 2012-07-28 ENCOUNTER — Other Ambulatory Visit: Payer: Self-pay | Admitting: Family Medicine

## 2012-07-28 MED ORDER — ROSUVASTATIN CALCIUM 10 MG PO TABS: 10.0000 mg | ORAL_TABLET | Freq: Every day | ORAL | Status: DC

## 2012-09-30 ENCOUNTER — Ambulatory Visit: Payer: Self-pay | Admitting: Family Medicine

## 2012-10-01 ENCOUNTER — Encounter (HOSPITAL_COMMUNITY): Payer: Self-pay | Admitting: *Deleted

## 2012-10-01 DIAGNOSIS — H538 Other visual disturbances: Secondary | ICD-10-CM | POA: Insufficient documentation

## 2012-10-01 DIAGNOSIS — F17200 Nicotine dependence, unspecified, uncomplicated: Secondary | ICD-10-CM | POA: Insufficient documentation

## 2012-10-01 DIAGNOSIS — R112 Nausea with vomiting, unspecified: Secondary | ICD-10-CM | POA: Insufficient documentation

## 2012-10-01 LAB — CBC WITH DIFFERENTIAL/PLATELET
Basophils Absolute: 0 10*3/uL (ref 0.0–0.1)
Basophils Relative: 0 % (ref 0–1)
Lymphocytes Relative: 24 % (ref 12–46)
MCHC: 35 g/dL (ref 30.0–36.0)
Neutro Abs: 8.5 10*3/uL — ABNORMAL HIGH (ref 1.7–7.7)
Neutrophils Relative %: 70 % (ref 43–77)
Platelets: 320 10*3/uL (ref 150–400)
RDW: 16.2 % — ABNORMAL HIGH (ref 11.5–15.5)
WBC: 12.2 10*3/uL — ABNORMAL HIGH (ref 4.0–10.5)

## 2012-10-01 LAB — COMPREHENSIVE METABOLIC PANEL
ALT: 12 U/L (ref 0–35)
AST: 12 U/L (ref 0–37)
Albumin: 3.7 g/dL (ref 3.5–5.2)
Alkaline Phosphatase: 74 U/L (ref 39–117)
CO2: 26 mEq/L (ref 19–32)
Chloride: 103 mEq/L (ref 96–112)
Creatinine, Ser: 0.65 mg/dL (ref 0.50–1.10)
GFR calc non Af Amer: >90 mL/min (ref >90)
Potassium: 3.6 mEq/L (ref 3.5–5.1)
Sodium: 138 mEq/L (ref 135–145)
Total Bilirubin: 0.6 mg/dL (ref 0.3–1.2)

## 2012-10-01 NOTE — ED Notes (Signed)
Pt states Nausea for the past couple of days and vomiting about 3 times yesterday. Pt also having blurred vision, pt states has not been able to take CBG at home. Pt was suppose to see PCP yesterday but weather cancelled her appointment. Pt states she just doesn't feel good. Pt denies HA, pt alert and oriented x4. Pt has no neuro deficits.

## 2012-10-02 ENCOUNTER — Emergency Department (HOSPITAL_COMMUNITY)
Admission: EM | Admit: 2012-10-02 | Discharge: 2012-10-02 | Disposition: A | Discharge status: Home / Self Care | Payer: Self-pay | Attending: Emergency Medicine | Admitting: Emergency Medicine

## 2012-10-02 NOTE — ED Notes (Signed)
Patient desires to leave due to wait times.  Patient educated on process, does not want to wait.  LWBS

## 2012-10-11 ENCOUNTER — Ambulatory Visit: Payer: Self-pay | Admitting: Family Medicine

## 2012-10-12 ENCOUNTER — Encounter: Payer: Self-pay | Admitting: *Deleted

## 2012-10-17 ENCOUNTER — Ambulatory Visit: Payer: Self-pay | Admitting: Family Medicine

## 2012-10-27 ENCOUNTER — Encounter: Payer: Self-pay | Admitting: Family Medicine

## 2012-10-27 ENCOUNTER — Ambulatory Visit (INDEPENDENT_AMBULATORY_CARE_PROVIDER_SITE_OTHER): Payer: Self-pay | Admitting: Family Medicine

## 2012-10-27 VITALS — BP 129/73 | HR 103 | Temp 97.7°F | Ht 59.0 in | Wt 198.0 lb

## 2012-10-27 DIAGNOSIS — IMO0001 Reserved for inherently not codable concepts without codable children: Secondary | ICD-10-CM

## 2012-10-27 DIAGNOSIS — E119 Type 2 diabetes mellitus without complications: Secondary | ICD-10-CM

## 2012-10-27 DIAGNOSIS — E1165 Type 2 diabetes mellitus with hyperglycemia: Secondary | ICD-10-CM

## 2012-10-27 MED ORDER — METFORMIN HCL ER 500 MG PO TB24: 500.0000 mg | ORAL_TABLET | Freq: Every day | ORAL | Status: DC

## 2012-10-27 NOTE — Patient Instructions (Addendum)
Try to take lantus every day.  Try to change metformin to long-acting version (try at [facility]). Let me know if you cant get medicine. Drink plenty of water.  Eat fruit or vegetables. Make an appointment in one month for check up.   Type 2 Diabetes Diabetes is a long-lasting (chronic) disease. One or both of the following happen with type 2 diabetes:   The pancreas does not make enough of a hormone called insulin.  The body has trouble using the insulin that is made. HOME CARE  Check your blood sugar (glucose) once a day, or as told by your doctor.  Take all medicine as told by your doctor.  Do not smoke.  Eat healthy foods. Weight loss can help your diabetes.  Learn about low blood sugar (hypoglycemia). Know how to treat it.  Get your eyes checked on a regular basis.  Get a physical exam every year. Get your blood pressure checked. Get your blood and pee (urine) tested.  Wear a necklace or bracelet that says you have diabetes.  Check your feet every night for cuts, sores, blisters, and redness. Tell your doctor if you have problems. GET HELP RIGHT AWAY IF:  You have trouble keeping your blood sugar in target range.  You have problems with your medicines.  You are sick and not getting better after 24 hours.  You have a sore or wound that is not healing.  You have vision problems or changes.  You have a fever. MAKE SURE YOU:  Understand these instructions.  Will watch your condition.  Will get help right away if you are not doing well or get worse. Document Released: 04/21/2008 Document Revised: 10/05/2011 Document Reviewed: 12/29/2010 [facility] Patient Information 2013 [place], [place].

## 2012-10-27 NOTE — Progress Notes (Signed)
  Subjective:    Patient ID: Kristy [name], female    DOB: 03-Jun-1966, 47 y.o.   MRN: [id number]  Diabetes    1. DM2. Control and A1c has worsened from 8.3 to 9.4. Patient endorses significant GI side effects including inconvenient times of bowel urgency related to taking metformin, therefore she has missed some doses of this. She also has been under lots of stress lately, related to the death of her niece. She was killed by her significant other. Patient has had a hectic past few weeks. States she missed four out of her last 7 Lantus doses. Last check CBG 3 weeks ago was about 130. She feels slightly fatigued. She denies significant polyuria, polydipsia, syncope, weakness, abdominal pain, nausea, vomiting.  2. stress reaction. States she is coping with the loss of her niece well, though she was her "favorite person". She has her own children at home to help. She denies any self-destructive behavior currently.  Review of Systems See HPI otherwise negative.  reports that she has been smoking Cigarettes.  She has been smoking about 0.25 packs per day. She does not have any smokeless tobacco history on file.     Objective:   Physical Exam  Vitals reviewed. Constitutional: She is oriented to person, place, and time. She appears well-developed and well-nourished. No distress.  HENT:  Head: Normocephalic and atraumatic.  Mouth/Throat: Oropharynx is clear and moist.  Cardiovascular: Normal rate, regular rhythm and normal heart sounds.   Pulmonary/Chest: Effort normal.  Neurological: She is alert and oriented to person, place, and time.  Skin: She is not diaphoretic.  Psychiatric: She has a normal mood and affect.       Assessment & Plan:

## 2012-10-27 NOTE — Assessment & Plan Note (Addendum)
Worsened control secondary to medication side effects and noncompliance. Discussed with patient self treatment plan. Goal for self-monitoring glucose once daily, compliance of medications, eating more vegetables. Will attempt to change metformin to XL form in attempt to avoid GI side effects . Likely patient can regain glycemic control simply with adherence to her daily Lantus. She is very confident this can be done. She agrees to followup in one month for recheck.   Greater than 50% of this visit spent with counseling on lifestyle.

## 2012-11-30 ENCOUNTER — Ambulatory Visit: Payer: Self-pay | Admitting: Family Medicine

## 2012-12-01 ENCOUNTER — Emergency Department (HOSPITAL_COMMUNITY)
Admission: EM | Admit: 2012-12-01 | Discharge: 2012-12-01 | Disposition: A | Discharge status: Home / Self Care | Payer: Self-pay | Attending: Emergency Medicine | Admitting: Emergency Medicine

## 2012-12-01 ENCOUNTER — Encounter (HOSPITAL_COMMUNITY): Payer: Self-pay | Admitting: *Deleted

## 2012-12-01 DIAGNOSIS — L02416 Cutaneous abscess of left lower limb: Secondary | ICD-10-CM

## 2012-12-01 DIAGNOSIS — E119 Type 2 diabetes mellitus without complications: Secondary | ICD-10-CM | POA: Insufficient documentation

## 2012-12-01 DIAGNOSIS — F17200 Nicotine dependence, unspecified, uncomplicated: Secondary | ICD-10-CM | POA: Insufficient documentation

## 2012-12-01 DIAGNOSIS — R7309 Other abnormal glucose: Secondary | ICD-10-CM | POA: Insufficient documentation

## 2012-12-01 DIAGNOSIS — Z7982 Long term (current) use of aspirin: Secondary | ICD-10-CM | POA: Insufficient documentation

## 2012-12-01 DIAGNOSIS — Z87442 Personal history of urinary calculi: Secondary | ICD-10-CM | POA: Insufficient documentation

## 2012-12-01 DIAGNOSIS — Z79899 Other long term (current) drug therapy: Secondary | ICD-10-CM | POA: Insufficient documentation

## 2012-12-01 DIAGNOSIS — R739 Hyperglycemia, unspecified: Secondary | ICD-10-CM

## 2012-12-01 DIAGNOSIS — Z794 Long term (current) use of insulin: Secondary | ICD-10-CM | POA: Insufficient documentation

## 2012-12-01 DIAGNOSIS — B379 Candidiasis, unspecified: Secondary | ICD-10-CM | POA: Insufficient documentation

## 2012-12-01 DIAGNOSIS — Z8739 Personal history of other diseases of the musculoskeletal system and connective tissue: Secondary | ICD-10-CM | POA: Insufficient documentation

## 2012-12-01 DIAGNOSIS — E785 Hyperlipidemia, unspecified: Secondary | ICD-10-CM | POA: Insufficient documentation

## 2012-12-01 DIAGNOSIS — L03119 Cellulitis of unspecified part of limb: Secondary | ICD-10-CM | POA: Insufficient documentation

## 2012-12-01 DIAGNOSIS — D649 Anemia, unspecified: Secondary | ICD-10-CM | POA: Insufficient documentation

## 2012-12-01 DIAGNOSIS — L02419 Cutaneous abscess of limb, unspecified: Secondary | ICD-10-CM | POA: Insufficient documentation

## 2012-12-01 LAB — URINALYSIS, ROUTINE W REFLEX MICROSCOPIC
Bilirubin Urine: NEGATIVE
Glucose, UA: >1000 mg/dL — AB
Hgb urine dipstick: NEGATIVE
Protein, ur: NEGATIVE mg/dL

## 2012-12-01 LAB — POCT I-STAT, CHEM 8
BUN: 8 mg/dL (ref 6–23)
Creatinine, Ser: 0.6 mg/dL (ref 0.50–1.10)
Glucose, Bld: 435 mg/dL — ABNORMAL HIGH (ref 70–99)
Sodium: 136 mEq/L (ref 135–145)
TCO2: 24 mmol/L (ref 0–100)

## 2012-12-01 LAB — URINE MICROSCOPIC-ADD ON

## 2012-12-01 LAB — GLUCOSE, CAPILLARY: Glucose-Capillary: 350 mg/dL — ABNORMAL HIGH (ref 70–99)

## 2012-12-01 MED ORDER — SODIUM CHLORIDE 0.9 % IV BOLUS (SEPSIS)
1000.0000 mL | Freq: Once | INTRAVENOUS | Status: AC
  Administered 2012-12-01: 1000 mL via INTRAVENOUS

## 2012-12-01 MED ORDER — CLINDAMYCIN HCL 150 MG PO CAPS: 300.0000 mg | ORAL_CAPSULE | Freq: Three times a day (TID) | ORAL | Status: DC

## 2012-12-01 MED ORDER — FLUCONAZOLE 200 MG PO TABS: 200.0000 mg | ORAL_TABLET | Freq: Every day | ORAL | Status: DC

## 2012-12-01 NOTE — ED Notes (Signed)
Pt c/o boil to the inner left thigh.  States it feels sore and has been increasing in size x 3 days.

## 2012-12-01 NOTE — ED Provider Notes (Signed)
History     CSN: [id number]  Arrival date & time 12/01/12  0055   First MD Initiated Contact with Patient 12/01/12 0102      Chief Complaint  Patient presents with  . Abscess   HPI  History provided by the patient. Patient is a 47 year old female with history of hyperlipidemia, diabetes who presents with complaints of boil to her left thigh. Patient noticed small area of redness and tenderness for the past few days. Today however her pain and swelling increased significantly. Patient is not use any treatments for symptoms. Denies having similar symptoms previously. Patient also states she's had difficulty keeping her blood sugars low recently and states they have been high. She has not checked it today. She has had some increased urination and polydipsia. Denies any other complaints or symptoms. No recent fever, chills or sweats. No cough or shortness of breath. No nausea or vomiting.    Past Medical History  Diagnosis Date  . Diabetes mellitus type II   . Hyperlipidemia   . Chronic back pain   . Anemia   . Kidney stones     Past Surgical History  Procedure Laterality Date  . Cesarean section    . Tubal ligation    . Kidney stone surgery      Family History  Problem Relation Age of Onset  . Adopted: Yes    History  Substance Use Topics  . Smoking status: Current Some Day Smoker -- 0.25 packs/day    Types: Cigarettes  . Smokeless tobacco: Not on file     Comment: one cigarette daily  . Alcohol Use: No    OB History   Grav Para Term Preterm Abortions TAB SAB Ect Mult Living   2 2 2       2       Review of Systems  Constitutional: Negative for fever, chills and diaphoresis.  All other systems reviewed and are negative.    Allergies  Oxycodone  Home Medications   Current Outpatient Rx  Name  Route  Sig  Dispense  Refill  . EXPIRED: amitriptyline (ELAVIL) 100 MG tablet   Oral   Take 0.5 tablets (50 mg total) by mouth at bedtime.   90 tablet   2   .  aspirin 81 MG tablet   Oral   Take 81 mg by mouth daily.           . ergocalciferol (VITAMIN D2) 50000 UNITS capsule   Oral   Take 1 capsule (50,000 Units total) by mouth once a week.   4 capsule   12   . ferrous sulfate 325 (65 FE) MG tablet   Oral   Take 325 mg by mouth daily with breakfast.           . EXPIRED: glucose blood (ONE TOUCH ULTRA TEST) test strip      Use as instructed   100 each   12   . insulin glargine (LANTUS SOLOSTAR) 100 UNIT/ML injection   Subcutaneous   Inject 25 Units into the skin at bedtime. OK to dispense vial   10 mL   11   . Insulin Syringe-Needle U-100 (BD INSULIN SYRINGE ULTRAFINE) 31G X 5/16" 1 ML MISC   Does not apply   1 Device by Does not apply route 2 (two) times daily. Qs for 2 times daily injections (1 box of 100)   100 each   11   . metFORMIN (GLUCOPHAGE XR) 500 MG 24 hr tablet  Oral   Take 1 tablet (500 mg total) by mouth daily with breakfast.   60 tablet   11     Start once daily, then twice daily if ok.   . EXPIRED: pantoprazole (PROTONIX) 20 MG tablet   Oral   Take 1 tablet (20 mg total) by mouth daily.   90 tablet   3   . rosuvastatin (CRESTOR) 10 MG tablet   Oral   Take 1 tablet (10 mg total) by mouth daily.   90 tablet   3   . traMADol (ULTRAM) 50 MG tablet      Take one to two tabs tid prn for back pain   90 tablet   1   . vitamin B-12 (CYANOCOBALAMIN) 1000 MCG tablet   Oral   Take 1 tablet (1,000 mcg total) by mouth daily.   90 tablet   1     BP 135/85  Pulse 85  Temp(Src) 98.5 F (36.9 C) (Oral)  Resp 18  SpO2 100%  LMP 11/13/2012  Physical Exam  Nursing note and vitals reviewed. Constitutional: She is oriented to person, place, and time. She appears well-developed and well-nourished. No distress.  HENT:  Head: Normocephalic.  Cardiovascular: Normal rate and regular rhythm.   Pulmonary/Chest: Effort normal and breath sounds normal. No respiratory distress. She has no wheezes. She has  no rales.  Abdominal: Soft. There is no tenderness. There is no rebound.  Musculoskeletal: Normal range of motion. She exhibits no edema and no tenderness.  Neurological: She is alert and oriented to person, place, and time.  Skin: Skin is warm and dry. No rash noted.  3 cm nodular area with tenderness to the medial and proximal left thigh. There is surrounding erythema of the skin. No active bleeding or drainage.  Psychiatric: She has a normal mood and affect. Her behavior is normal.    ED Course  Procedures   INCISION AND DRAINAGE Performed by: Angus Seller Consent: Verbal consent obtained. Risks and benefits: risks, benefits and alternatives were discussed Type: abscess  Body area: left thigh  Anesthesia: local infiltration  Incision was made with a scalpel.  Local anesthetic: lidocaine 2% with epinephrine  Anesthetic total: 5 ml  Complexity: complex Blunt dissection to break up loculations  Drainage: purulent  Drainage amount: moderate  Packing material: 1/4 in iodoform gauze  Patient tolerance: Patient tolerated the procedure well with no immediate complications.      Results for orders placed during the hospital encounter of 12/01/12  URINALYSIS, ROUTINE W REFLEX MICROSCOPIC      Result Value Range   Color, Urine YELLOW  YELLOW   APPearance CLEAR  CLEAR   Specific Gravity, Urine <1.005 (*) 1.005 - 1.030   pH 6.5  5.0 - 8.0   Glucose, UA >1000 (*) NEGATIVE mg/dL   Hgb urine dipstick NEGATIVE  NEGATIVE   Bilirubin Urine NEGATIVE  NEGATIVE   Ketones, ur NEGATIVE  NEGATIVE mg/dL   Protein, ur NEGATIVE  NEGATIVE mg/dL   Urobilinogen, UA 1.0  0.0 - 1.0 mg/dL   Nitrite NEGATIVE  NEGATIVE   Leukocytes, UA NEGATIVE  NEGATIVE  URINE MICROSCOPIC-ADD ON      Result Value Range   Squamous Epithelial / LPF RARE  RARE   WBC, UA 11-20  <3 WBC/hpf   RBC / HPF 0-2  <3 RBC/hpf   Bacteria, UA RARE  RARE   Urine-Other RARE YEAST    GLUCOSE, CAPILLARY      Result  Value Range  Glucose-Capillary 350 (*) 70 - 99 mg/dL  GLUCOSE, CAPILLARY      Result Value Range   Glucose-Capillary 265 (*) 70 - 99 mg/dL  POCT I-STAT, CHEM 8      Result Value Range   Sodium 136  135 - 145 mEq/L   Potassium 3.5  3.5 - 5.1 mEq/L   Chloride 102  96 - 112 mEq/L   BUN 8  6 - 23 mg/dL   Creatinine, Ser 1.91  0.50 - 1.10 mg/dL   Glucose, Bld 478 (*) 70 - 99 mg/dL   Calcium, Ion 2.95  6.21 - 1.23 mmol/L   TCO2 24  0 - 100 mmol/L   Hemoglobin 13.3  12.0 - 15.0 g/dL   HCT 30.8  65.7 - 84.6 %     1. Abscess of left thigh   2. Hyperglycemia   3. Yeast infection       MDM  1:10 AM patient seen and evaluated. Patient appears well in no acute distress.  Patient continues to do well. Sugar is improving with fluids. We'll continue second IV bolus and recheck sugar.  Sugar now in 200s. No signs for DKA. We'll discharge home.      Angus Seller, PA-C 12/01/12 [phone]

## 2012-12-01 NOTE — ED Provider Notes (Signed)
Medical screening examination/treatment/procedure(s) were performed by non-physician practitioner and as supervising physician I was immediately available for consultation/collaboration.   Mirakle Tomlin L Alexande Sheerin, MD 12/01/12 0615 

## 2012-12-01 NOTE — ED Notes (Signed)
[name], PA performed I&D of left inner thigh abscess. Pt tolerated well.

## 2012-12-01 NOTE — ED Notes (Signed)
PT comfortable with discharge instrcutions and f/u instructions. Prescriptions x2

## 2012-12-01 NOTE — ED Notes (Signed)
  CBG 265  

## 2012-12-03 ENCOUNTER — Emergency Department (HOSPITAL_COMMUNITY)
Admission: EM | Admit: 2012-12-03 | Discharge: 2012-12-03 | Disposition: A | Discharge status: Home / Self Care | Payer: Self-pay | Attending: Emergency Medicine | Admitting: Emergency Medicine

## 2012-12-03 ENCOUNTER — Encounter (HOSPITAL_COMMUNITY): Payer: Self-pay | Admitting: *Deleted

## 2012-12-03 DIAGNOSIS — Z4801 Encounter for change or removal of surgical wound dressing: Secondary | ICD-10-CM | POA: Insufficient documentation

## 2012-12-03 DIAGNOSIS — M549 Dorsalgia, unspecified: Secondary | ICD-10-CM | POA: Insufficient documentation

## 2012-12-03 DIAGNOSIS — Z5189 Encounter for other specified aftercare: Secondary | ICD-10-CM

## 2012-12-03 DIAGNOSIS — Z7982 Long term (current) use of aspirin: Secondary | ICD-10-CM | POA: Insufficient documentation

## 2012-12-03 DIAGNOSIS — G8929 Other chronic pain: Secondary | ICD-10-CM | POA: Insufficient documentation

## 2012-12-03 DIAGNOSIS — F17200 Nicotine dependence, unspecified, uncomplicated: Secondary | ICD-10-CM | POA: Insufficient documentation

## 2012-12-03 DIAGNOSIS — E1169 Type 2 diabetes mellitus with other specified complication: Secondary | ICD-10-CM | POA: Insufficient documentation

## 2012-12-03 DIAGNOSIS — Z79899 Other long term (current) drug therapy: Secondary | ICD-10-CM | POA: Insufficient documentation

## 2012-12-03 DIAGNOSIS — Z794 Long term (current) use of insulin: Secondary | ICD-10-CM | POA: Insufficient documentation

## 2012-12-03 DIAGNOSIS — E785 Hyperlipidemia, unspecified: Secondary | ICD-10-CM | POA: Insufficient documentation

## 2012-12-03 DIAGNOSIS — R739 Hyperglycemia, unspecified: Secondary | ICD-10-CM

## 2012-12-03 DIAGNOSIS — B379 Candidiasis, unspecified: Secondary | ICD-10-CM | POA: Insufficient documentation

## 2012-12-03 DIAGNOSIS — L02419 Cutaneous abscess of limb, unspecified: Secondary | ICD-10-CM | POA: Insufficient documentation

## 2012-12-03 DIAGNOSIS — Z87442 Personal history of urinary calculi: Secondary | ICD-10-CM | POA: Insufficient documentation

## 2012-12-03 DIAGNOSIS — Z862 Personal history of diseases of the blood and blood-forming organs and certain disorders involving the immune mechanism: Secondary | ICD-10-CM | POA: Insufficient documentation

## 2012-12-03 MED ORDER — HYDROCODONE-ACETAMINOPHEN 5-325 MG PO TABS: 1.0000 | ORAL_TABLET | Freq: Four times a day (QID) | ORAL | Status: DC | PRN

## 2012-12-03 MED ORDER — FLUCONAZOLE 100 MG PO TABS: 150.0000 mg | ORAL_TABLET | Freq: Once | ORAL | Status: DC

## 2012-12-03 NOTE — ED Provider Notes (Signed)
Medical screening examination/treatment/procedure(s) were performed by non-physician practitioner and as supervising physician I was immediately available for consultation/collaboration.   Johna Kearl M Aubriee Szeto, MD 12/03/12 2208 

## 2012-12-03 NOTE — ED Notes (Signed)
Pt is here with left inner thigh abscess that was drained on Wednesday and now it is worse.

## 2012-12-03 NOTE — ED Provider Notes (Signed)
History     CSN: [id number]  Arrival date & time 12/03/12  1220   First MD Initiated Contact with Patient 12/03/12 1227      Chief Complaint  Patient presents with  . Abscess    (Consider location/radiation/quality/duration/timing/severity/associated sxs/prior treatment) HPI  47 year old female with a past history of poorly controlled diabetes with I&D of left thigh abscess 2 days ago here in the emergency department presents for evaluation of her wound and pain in her thigh.Patient is a poor historian. History is limited by the mental capacity of the patient, patient's ability to communicate effectively, and overall poor insight.   Patient states she did not have very much pain at the time that she came to her last visit however she was having spreading redness from the wound.  The wound was incised and drained and packed with quarter inch iodoform gauze.  The patient was discharged with clindamycin which he has been taking as directed.  Patient states that she has severe pain that is unrelieved with Tylenol or Advil.  She denies any fevers, chills, nausea, vomiting, malaise, arthralgias or myalgias. Patient has some drainage from the wound however it is nonpurulent.  She is unsure if she has some resolution of the surrounding redness.  Past Medical History  Diagnosis Date  . Diabetes mellitus type II   . Hyperlipidemia   . Chronic back pain   . Anemia   . Kidney stones     Past Surgical History  Procedure Laterality Date  . Cesarean section    . Tubal ligation    . Kidney stone surgery      Family History  Problem Relation Age of Onset  . Adopted: Yes    History  Substance Use Topics  . Smoking status: Current Some Day Smoker -- 0.25 packs/day    Types: Cigarettes  . Smokeless tobacco: Not on file     Comment: one cigarette daily  . Alcohol Use: No    OB History   Grav Para Term Preterm Abortions TAB SAB Ect Mult Living   2 2 2       2       Review of  Systems Ten systems reviewed and are negative for acute change, except as noted in the HPI. \ Allergies  Oxycodone  Home Medications   Current Outpatient Rx  Name  Route  Sig  Dispense  Refill  . acetaminophen (TYLENOL) 500 MG tablet   Oral   Take 1,000 mg by mouth every 6 (six) hours as needed for pain.         Marland Kitchen aspirin 81 MG tablet   Oral   Take 81 mg by mouth daily.           . clindamycin (CLEOCIN) 150 MG capsule   Oral   Take 2 capsules (300 mg total) by mouth 3 (three) times daily.   42 capsule   0   . fluconazole (DIFLUCAN) 200 MG tablet   Oral   Take 1 tablet (200 mg total) by mouth daily.   3 tablet   0   . ibuprofen (ADVIL,MOTRIN) 800 MG tablet   Oral   Take 800 mg by mouth once.         . insulin glargine (LANTUS SOLOSTAR) 100 UNIT/ML injection   Subcutaneous   Inject 25 Units into the skin at bedtime. OK to dispense vial   10 mL   11   . metFORMIN (GLUCOPHAGE-XR) 500 MG 24 hr tablet  Oral   Take 500 mg by mouth 2 (two) times daily. Pt states dr told her to increase to twice a day dosing if she could tolerate it, pt can tolerate.         . pantoprazole (PROTONIX) 20 MG tablet   Oral   Take 20 mg by mouth daily.         . rosuvastatin (CRESTOR) 10 MG tablet   Oral   Take 1 tablet (10 mg total) by mouth daily.   90 tablet   3     BP 123/72  Pulse 88  Temp(Src) 98 F (36.7 C) (Oral)  Resp 20  SpO2 96%  LMP 11/13/2012  Physical Exam Physical Exam  Nursing note and vitals reviewed. Constitutional: Obese female appearing much older than her stated age. HENT:  Head: Normocephalic and atraumatic.  Eyes: Conjunctivae normal and EOM are normal. Pupils are equal, round, and reactive to light. No scleral icterus.  Neck: Normal range of motion.  Cardiovascular: Normal rate, regular rhythm and normal heart sounds.  Exam reveals no gallop and no friction rub.   No murmur heard. Pulmonary/Chest: Effort normal and breath sounds normal. No  respiratory distress.  Abdominal: Soft. Bowel sounds are normal. She exhibits no distension and no mass. There is no tenderness. There is no guarding.  Neurological: She is alert and oriented to person, place, and time.  Skin: 2-1 cm open cavity in the left medial thigh. No purulence. 4-cm of surrounding induration and 6 cm of surrounding poorly demarcated erythema.   ED Course  Procedures (including critical care time)  Labs Reviewed - No data to display No results found.   1. Wound check, abscess   2. Yeast infection   3. Hyperglycemia       MDM  12:47 PM Patient with abcess. No purulent discharge. Appears to be healing. I have removed the packing and with wash the wound. Will obtain CBG.  .1:47 PM Patient with well healing abscess. cbg 220. Will give sub q insulin.  Patient had a yeast infection at last visit. Will treat today. Patient may repeat dose of diflucan when finished with her abx. The patient appears reasonably screened and/or stabilized for discharge and I doubt any other medical condition or other [facility] requiring further screening, evaluation, or treatment in the ED at this time prior to discharge.     Arthor Captain, PA-C 12/03/12 1401

## 2012-12-07 ENCOUNTER — Other Ambulatory Visit: Payer: Self-pay | Admitting: Family Medicine

## 2012-12-07 ENCOUNTER — Ambulatory Visit (INDEPENDENT_AMBULATORY_CARE_PROVIDER_SITE_OTHER): Payer: Self-pay | Admitting: Family Medicine

## 2012-12-07 ENCOUNTER — Encounter: Payer: Self-pay | Admitting: Family Medicine

## 2012-12-07 VITALS — BP 109/67 | HR 83 | Temp 99.4°F | Ht 59.0 in | Wt 194.5 lb

## 2012-12-07 DIAGNOSIS — E1165 Type 2 diabetes mellitus with hyperglycemia: Secondary | ICD-10-CM

## 2012-12-07 DIAGNOSIS — L089 Local infection of the skin and subcutaneous tissue, unspecified: Secondary | ICD-10-CM | POA: Insufficient documentation

## 2012-12-07 MED ORDER — QUINAPRIL HCL 5 MG PO TABS: 5.0000 mg | ORAL_TABLET | Freq: Every day | ORAL | Status: DC

## 2012-12-07 NOTE — Progress Notes (Signed)
  Subjective:    Patient ID: Kristy [name], female    DOB: 05/08/1966, 47 y.o.   MRN: [id number]  Diabetes    1. DM2. Reports her CBGs were elevated during recent skin infection. Since completing abx, she notes improvement now between 150-189, but does not check daily. During infection ranged 350-220. She is compliant with lantus, has not missed any doses. Is tolerating low dose metformin, but not high dose due to GI effects of loose stools. No recent hypoglycemia episodes. She did have an eye exam in November she reports.   2. F/u abscess. Had an I&D in ER and finished rx of clindamyin. The spot is healing and pain is much improved. The tissue is slightly sore she states, but no further redness, pus or swelling. She is covering with a bandaid and using abx ointment.  Denies nausea, emesis, redness, fever, chills, leg swelling, diarrhea.  Review of Systems See HPI otherwise negative.  reports that she has been smoking Cigarettes.  She has been smoking about 0.25 packs per day. She does not have any smokeless tobacco history on file.     Objective:   Physical Exam  Vitals reviewed. Constitutional: She is oriented to person, place, and time. She appears well-developed and well-nourished. No distress.  Musculoskeletal: She exhibits no edema and no tenderness.  Neurological: She is alert and oriented to person, place, and time.  Skin: She is not diaphoretic.  Left inguinal area has a healing 1 cm open wound from I&D. Wound bed is pink with slight tan granulation tissue. Mild darkened skin surrounding the margins. No surrounding erythema, tenderness.   Psychiatric: She has a normal mood and affect.        Assessment & Plan:

## 2012-12-07 NOTE — Progress Notes (Signed)
Patient Identified Concern:  DM managment Stage of Change Patient Is In:  Contemplation, planning on making changes in next 6 months Patient Reported Barriers:  Habits, dislikes many healthy foods Patient Reported Perceived Benefits:  Feeling better, losing weight Patient Reports Self-Efficacy:   TBD Behavior Change Supports:  Family is behind her not eating sugar and taking care of herself. Goals:  To stop drinking soft drinks, talk walks 3-4 times a week with daughter at baseball field.  Patient Education:  We talked about foods that raise blood sugar.  We talked about other things that may influence blood sugar such as tobacco, exercise, stress, and med adherence.

## 2012-12-07 NOTE — Patient Instructions (Addendum)
Increase your lantus to 27 units at night. Think about walking more. Talk to health coach Rosalita Chessman. Make an appointment in 2 months for check up or sooner if needed.

## 2012-12-07 NOTE — Assessment & Plan Note (Signed)
Appears to be healing abscess s/p I&D. No further tenderness, erythema or sign of infection. Advised her to keep covered with antibiotic ointment and monitor closely. Soak in tub at night. She will contact office if symptoms recur.

## 2012-12-07 NOTE — Assessment & Plan Note (Signed)
Above goal. Increase lantus by 2 units again today. Compliance is improved, but hyperglycemia worsened by recent infection. Advised to f/u in 2 months.

## 2012-12-23 ENCOUNTER — Other Ambulatory Visit: Payer: Self-pay | Admitting: *Deleted

## 2012-12-23 DIAGNOSIS — E1169 Type 2 diabetes mellitus with other specified complication: Secondary | ICD-10-CM

## 2012-12-23 DIAGNOSIS — IMO0001 Reserved for inherently not codable concepts without codable children: Secondary | ICD-10-CM

## 2012-12-23 NOTE — Telephone Encounter (Signed)
Please fax new script to [facility] to reflect increase of Lantus as discussed in 5/14 office visit. Thanks! Wyatt Haste, RN-BSN

## 2012-12-25 MED ORDER — INSULIN GLARGINE 100 UNIT/ML ~~LOC~~ SOLN: 27.0000 [IU] | Freq: Every day | SUBCUTANEOUS | Status: DC

## 2012-12-26 NOTE — Telephone Encounter (Signed)
Called in Rx to [facility].  Vernon Maish, Darlyne Russian, CMA

## 2013-02-02 ENCOUNTER — Other Ambulatory Visit: Payer: Self-pay

## 2013-02-08 ENCOUNTER — Encounter: Payer: Self-pay | Admitting: Family Medicine

## 2013-02-08 ENCOUNTER — Ambulatory Visit (INDEPENDENT_AMBULATORY_CARE_PROVIDER_SITE_OTHER): Payer: No Typology Code available for payment source | Admitting: Family Medicine

## 2013-02-08 VITALS — BP 124/81 | HR 90 | Temp 98.3°F | Wt 194.0 lb

## 2013-02-08 DIAGNOSIS — E1165 Type 2 diabetes mellitus with hyperglycemia: Secondary | ICD-10-CM

## 2013-02-08 DIAGNOSIS — R809 Proteinuria, unspecified: Secondary | ICD-10-CM

## 2013-02-08 DIAGNOSIS — R51 Headache: Secondary | ICD-10-CM

## 2013-02-08 DIAGNOSIS — E119 Type 2 diabetes mellitus without complications: Secondary | ICD-10-CM

## 2013-02-08 DIAGNOSIS — E1129 Type 2 diabetes mellitus with other diabetic kidney complication: Secondary | ICD-10-CM

## 2013-02-08 DIAGNOSIS — IMO0001 Reserved for inherently not codable concepts without codable children: Secondary | ICD-10-CM

## 2013-02-08 DIAGNOSIS — E1169 Type 2 diabetes mellitus with other specified complication: Secondary | ICD-10-CM

## 2013-02-08 DIAGNOSIS — E669 Obesity, unspecified: Secondary | ICD-10-CM

## 2013-02-08 MED ORDER — INSULIN GLARGINE 100 UNIT/ML ~~LOC~~ SOLN: 29.0000 [IU] | Freq: Every day | SUBCUTANEOUS | Status: DC

## 2013-02-08 NOTE — Progress Notes (Signed)
Patient ID: Kristy [name], female   DOB: Aug 16, 1965, 47 y.o.   MRN: [id number]  HPI:  Headache: c/o headache and pain in her L ear, along with L sided neck stiffness. Has tried tylenol for her headache but states that it does not help. The headache has been present for several months, but comes and goes. Happens about once per day. She has had this one today for over one hour which is longer than normal. Her L eye is watery today. The pain is located in the back of her head, right in the middle. Has been taking tylenol 1-2 times per day. Nothing seems to make the headache better or worse. She says the tylenol has helped her go to sleep. She works at [employer] and puts headphones on, and so she thought the headache was due to the headphones, but she's noticed that it is hurting when she is not at work too. Her L ear has been hurting x 2 months. She has had headaches chronically in the past and was supposed to start a TCA for preventive medicine but states she wasn't able to afford the medicine. Rates her pain as 7/10. She does wear glasses. Denies fevers.   Diabetes: Checks sugars every day or every other day, around 12 noon. Gets 100s-200s usually. Takes insulin at night, currently on Lantus 27 units. No CP or SOB. No dizziness or lightheadedness.   ROS: See HPI  PMFSH: hx DM, HLD, chronic headache  PHYSICAL EXAM: BP 124/81  Pulse 90  Temp(Src) 98.3 F (36.8 C) (Oral)  Wt 194 lb (87.998 kg)  BMI 39.16 kg/m2  LMP 02/08/2013 Gen: NAD Heart: RRR Lungs: CTAB Neuro: grossly nonfocal. Speech intact. Sensation intact to light touch on bilateral face. Face symmetric. PERRL. Grip 5/5, knee extension 5/5 bilaterally. Gait normal. Full range of motion of neck.  ASSESSMENT/PLAN:  # Health maintenance:  -recommended pt schedule appt for pap smear.  -states she had pneumovax last year

## 2013-02-08 NOTE — Patient Instructions (Addendum)
It was great to see you again today!  I recommend you get a pap smear. Schedule a visit to discuss this and other health maintenance items. Increase your Lantus to 29 units every night. Check your blood sugar every morning before eating and write down the #. Bring in the list of #'s as this will help [place] adjust your Lantus dose. Get back on the aspirin 81mg  daily - you can buy this over the counter. For the headache, try taking ibuprofen. Don't take it every day. If it doesn't get better or the headache gets worse, come back to see me sooner.  Come back in 3-4 weeks so we can talk about the Lantus dosing. You can call if you have any questions.  Be well, Dr. Pollie Meyer

## 2013-02-11 NOTE — Assessment & Plan Note (Addendum)
Doubt intracranial finding given neuro exam nonfocal, and that headaches are not constant (come and go around once per day). Tylenol has not helped. Recommend pt try ibuprofen to see if it will help her headache. Advised not to take this every day, especially given her hx of GERD. Unclear if headaches are tension vs. Cluster type headaches. May benefit from neuro referral in the future, but since it is my first time meeting pt and she has had these headaches in the past, will treat conservatively just with ibuprofen and tylenol prn for now. Advised she return if headaches worsen or do not improve.

## 2013-02-11 NOTE — Assessment & Plan Note (Addendum)
A1c remains elevated at 9.4. Pt not checking morning sugars, so it is difficult to titrate her insulin effectively. Since she denies symptoms of hypoglycemia, will have her increase Lantus by 2 units nightly to 29 units QHS. Advised pt check CBG's in the morning prior to eating anything, as this will really guide how much we are able to increase her Lantus. Pt understood these instructions. F/u in 3-4 weeks for further Lantus titration.  Cardiac: on statin, recommend resume baby aspirin (pt has been out) Renal: on ACE for renal protection Eye: due in November 2014 Foot: due in September 2014

## 2013-02-17 NOTE — Addendum Note (Signed)
Addended by: Latrelle Dodrill on: 02/17/2013 10:59 AM   Modules accepted: Level of Service

## 2013-02-21 ENCOUNTER — Other Ambulatory Visit: Payer: Self-pay | Admitting: *Deleted

## 2013-03-03 ENCOUNTER — Ambulatory Visit (INDEPENDENT_AMBULATORY_CARE_PROVIDER_SITE_OTHER): Payer: No Typology Code available for payment source | Admitting: Family Medicine

## 2013-03-03 ENCOUNTER — Encounter: Payer: Self-pay | Admitting: Family Medicine

## 2013-03-03 VITALS — BP 125/84 | HR 74 | Temp 98.4°F | Wt 194.0 lb

## 2013-03-03 DIAGNOSIS — E1165 Type 2 diabetes mellitus with hyperglycemia: Secondary | ICD-10-CM

## 2013-03-03 DIAGNOSIS — R809 Proteinuria, unspecified: Secondary | ICD-10-CM

## 2013-03-03 DIAGNOSIS — E119 Type 2 diabetes mellitus without complications: Secondary | ICD-10-CM

## 2013-03-03 DIAGNOSIS — IMO0001 Reserved for inherently not codable concepts without codable children: Secondary | ICD-10-CM

## 2013-03-03 DIAGNOSIS — E669 Obesity, unspecified: Secondary | ICD-10-CM

## 2013-03-03 DIAGNOSIS — E1129 Type 2 diabetes mellitus with other diabetic kidney complication: Secondary | ICD-10-CM

## 2013-03-03 MED ORDER — GABAPENTIN 300 MG PO CAPS: 300.0000 mg | ORAL_CAPSULE | Freq: Three times a day (TID) | ORAL | Status: DC

## 2013-03-03 MED ORDER — INSULIN GLARGINE 100 UNIT/ML ~~LOC~~ SOLN: 32.0000 [IU] | Freq: Every day | SUBCUTANEOUS | Status: DC

## 2013-03-03 NOTE — Progress Notes (Signed)
Patient ID: Kristy [name], female   DOB: [birth year], 47 y.o.   MRN: [id number]   HPI:  DM: Currently taking Lantus 29 units every morning. She is checking her sugars in the morning some, has been getting in the high 100s-low 200s. No numbers below 100. Denies chest pain. Occasionally gets short of breath if she "overdoes it".   R hand numbness: Has been present for about 3-4 months. It's a tingling/numbness type sensation. Has come and gone since it started.   ROS: See HPI  PMFSH: hx poorly controlled DM  PHYSICAL EXAM: BP 125/84  Pulse 74  Temp(Src) 98.4 F (36.9 C) (Oral)  Wt 194 lb (87.998 kg)  BMI 39.16 kg/m2  LMP 02/08/2013 Gen: NAD Heart: RRR Lungs: CTAB Neuro: nonfocal grossly, speech intact Ext: bilateral hands with good strength, warm and well perfused. Negative tinels and phalens on R. Slightly diminished sensation to light touch over extensor surface of hand just proximal to thumb.  ASSESSMENT/PLAN:  # Hand tinging/numbness: - no signs of carpal tunnel on exam today with negative tinels' and phalens. Given patient's hx of poorly controlled diabetes, I suspect this could be a neuropathic-type pain, possibly from diabetic neuropathy. Will give gabapentin 300mg  TID to try, to see if this helps the numbness. Pt will f/u in 3 weeks, at which time we can see how this medicine has worked for her.

## 2013-03-03 NOTE — Patient Instructions (Addendum)
Increase your Lantus by 3 units, to take 32 units at night. Check your sugars every morning and write them down. Come back in 3 weeks to follow up.  I am giving you a medicine for gabapentin to help with the tingling/numbness. Use caution because it may make you sleepy.   I will see you in 3 weeks.  Dr. Pollie Meyer

## 2013-03-13 NOTE — Assessment & Plan Note (Signed)
Will increase Lantus up by 3 units to 32 units QHS as patient does not seem to be having any lows. Pt to continue with CBG checks at home. F/u in 3 weeks for further titration of Lantus.

## 2013-03-31 ENCOUNTER — Ambulatory Visit: Payer: No Typology Code available for payment source | Admitting: Family Medicine

## 2013-04-17 ENCOUNTER — Ambulatory Visit: Payer: No Typology Code available for payment source | Admitting: Family Medicine

## 2013-05-01 ENCOUNTER — Ambulatory Visit: Payer: No Typology Code available for payment source | Admitting: Family Medicine

## 2013-05-15 ENCOUNTER — Ambulatory Visit: Payer: No Typology Code available for payment source | Admitting: Family Medicine

## 2013-05-31 ENCOUNTER — Encounter: Payer: Self-pay | Admitting: Family Medicine

## 2013-05-31 ENCOUNTER — Ambulatory Visit (INDEPENDENT_AMBULATORY_CARE_PROVIDER_SITE_OTHER): Payer: No Typology Code available for payment source | Admitting: Family Medicine

## 2013-05-31 VITALS — BP 123/84 | HR 81 | Temp 99.0°F | Ht 59.0 in | Wt 193.1 lb

## 2013-05-31 DIAGNOSIS — E1165 Type 2 diabetes mellitus with hyperglycemia: Secondary | ICD-10-CM

## 2013-05-31 DIAGNOSIS — J069 Acute upper respiratory infection, unspecified: Secondary | ICD-10-CM

## 2013-05-31 NOTE — Progress Notes (Signed)
Patient ID: Kristy [name], female   DOB: November 10, 1965, 47 y.o.   MRN: [id number]  HPI:  Diabetes mellitus: She checks her blood sugars at home. In the mornings when she wakes up she usually gets 180s to 200s, this is before eating. The lowest number she has gotten is 105, and happens later during the day after she has eaten something. She states that she can tell when her blood sugar gets low, and she knows to eat something. She takes her insulin at night. She's currently taking Lantus 32 units daily. She denies any chest pain, shortness of breath, problems with her feet, persistent polyuria, or persistent polydipsia.  URI symptoms: Patient states she started to have a cold on Saturday evening. She has not checked her temperature at home. She has felt congested in her nose. She thinks she is getting better, she feels better today than she did yesterday. She has had some decrease appetite. She's not been drinking a a lot of fluids.  ROS: See HPI  PMFSH: History of diabetes, poorly controlled  PHYSICAL EXAM: BP 123/84  Pulse 81  Temp(Src) 99 F (37.2 C) (Oral)  Ht 4\' 11"  (1.499 m)  Wt 193 lb 1.6 oz (87.59 kg)  BMI 38.98 kg/m2  LMP 05/13/2013 Gen: No acute distress, pleasant and cooperative HEENT: Tongue is dry the mucous membranes moist. TMs clear bilaterally. No appreciable cervical lymphadenopathy anteriorly. Heart: Regular rate and rhythm, no murmurs Lungs: Clear to auscultation bilaterally, normal respiratory effort Neuro: Grossly nonfocal, speech intact Extremities: Unable to perform monofilament exam today as could not locate monofilament exam room.  ASSESSMENT/PLAN:  # URI symptoms: Appears viral, and no exam findings suggesting the need for antibiotics. Advised saline nasal spray for symptomatic relief. Encouraged patient to drink plenty of fluids to stay hydrated. Followup if not improving in the next few days.  See problem based charting for additional assessment/plan.  FOLLOW  UP: F/u in 3 weeks for further insulin titration.  [place] J. Pollie Meyer, MD [facility]

## 2013-05-31 NOTE — Assessment & Plan Note (Signed)
A1c 10, which is worse than it was back in July. Suggest poor control. Discussed with patient the need to increase insulin, and get better control of her diabetes. Will increase insulin by 3 units daily to 35 units total per day. Advised patient to followup in 3 weeks for further insulin titration.  Cardiac: on aspirin, statin Renal: on ACE Eye: done within last year Foot: due for foot exam, but could not find monofilament so will defer to next appointment.

## 2013-05-31 NOTE — Patient Instructions (Signed)
It was great to see you again today!  Let's increase your Lantus to 35 units a day. Check your blood sugar multiple times during the day and write down the numbers. Bring those, plus all your medicines, to your next clinic appointment. Follow up in 3 weeks.  I think your cold is viral and will continue to get better on its own. You can use saline nasal spray as needed. Return if worsening, fevers, or not improved in the next few days.  Be well, Dr. Pollie Meyer

## 2013-07-05 ENCOUNTER — Emergency Department (HOSPITAL_COMMUNITY)
Admission: EM | Admit: 2013-07-05 | Discharge: 2013-07-05 | Disposition: A | Discharge status: Home / Self Care | Payer: Self-pay | Attending: Emergency Medicine | Admitting: Emergency Medicine

## 2013-07-05 ENCOUNTER — Encounter (HOSPITAL_COMMUNITY): Payer: Self-pay | Admitting: Emergency Medicine

## 2013-07-05 DIAGNOSIS — M25819 Other specified joint disorders, unspecified shoulder: Secondary | ICD-10-CM | POA: Insufficient documentation

## 2013-07-05 DIAGNOSIS — G8929 Other chronic pain: Secondary | ICD-10-CM | POA: Insufficient documentation

## 2013-07-05 DIAGNOSIS — R209 Unspecified disturbances of skin sensation: Secondary | ICD-10-CM | POA: Insufficient documentation

## 2013-07-05 DIAGNOSIS — M549 Dorsalgia, unspecified: Secondary | ICD-10-CM | POA: Insufficient documentation

## 2013-07-05 DIAGNOSIS — M7542 Impingement syndrome of left shoulder: Secondary | ICD-10-CM

## 2013-07-05 DIAGNOSIS — Z7982 Long term (current) use of aspirin: Secondary | ICD-10-CM | POA: Insufficient documentation

## 2013-07-05 DIAGNOSIS — E785 Hyperlipidemia, unspecified: Secondary | ICD-10-CM | POA: Insufficient documentation

## 2013-07-05 DIAGNOSIS — Z87891 Personal history of nicotine dependence: Secondary | ICD-10-CM | POA: Insufficient documentation

## 2013-07-05 DIAGNOSIS — Z794 Long term (current) use of insulin: Secondary | ICD-10-CM | POA: Insufficient documentation

## 2013-07-05 DIAGNOSIS — Z79899 Other long term (current) drug therapy: Secondary | ICD-10-CM | POA: Insufficient documentation

## 2013-07-05 DIAGNOSIS — E119 Type 2 diabetes mellitus without complications: Secondary | ICD-10-CM | POA: Insufficient documentation

## 2013-07-05 MED ORDER — NAPROXEN 500 MG PO TABS: 500.0000 mg | ORAL_TABLET | Freq: Two times a day (BID) | ORAL | Status: DC

## 2013-07-05 MED ORDER — METHOCARBAMOL 500 MG PO TABS: 500.0000 mg | ORAL_TABLET | Freq: Two times a day (BID) | ORAL | Status: DC

## 2013-07-05 NOTE — ED Notes (Signed)
Pt c/o intermittent L shoulder and arm pain x 3 weeks.

## 2013-07-05 NOTE — ED Provider Notes (Signed)
Medical screening examination/treatment/procedure(s) were performed by non-physician practitioner and as supervising physician I was immediately available for consultation/collaboration.  Toy Baker, MD 07/05/13 [id number]

## 2013-07-05 NOTE — ED Provider Notes (Signed)
CSN: [id number]     Arrival date & time 07/05/13  4098 History   First MD Initiated Contact with Patient 07/05/13 1005     Chief Complaint  Patient presents with  . Arm Pain   (Consider location/radiation/quality/duration/timing/severity/associated sxs/prior Treatment) HPI  47 year old female with history of insulin-dependent diabetic, chronic back pain, and hyperlipidemia presents complaining of left shoulder pain. Patient reports for the past 3 weeks she has had gradual onset of intermittent pain to the left shoulder and left upper arm. Describe pain as a dull and achy sensation, lasting minutes to hours, and sometimes worsen with movement.  For the past 2 weeks she also report intermittent numbness to her L thumb and hand with decreased grip strength.  Pain worse when she lays on her L arm while sleeping.  She takes tylenol without relief.  Denies fever, chills, cp, sob, productive cough, exertional chest pain, diaphoresis, lightheadedness, dizziness, or rash.  Denies any recent trauma.  Is R hand dominant.  Her daughter urging pt to come to ER for further care.  Pt has no prior hx of DVT or PE.    Past Medical History  Diagnosis Date  . Diabetes mellitus type II   . Hyperlipidemia   . Chronic back pain   . Anemia   . Kidney stones    Past Surgical History  Procedure Laterality Date  . Cesarean section    . Tubal ligation    . Kidney stone surgery     Family History  Problem Relation Age of Onset  . Adopted: Yes   History  Substance Use Topics  . Smoking status: Former Smoker -- 0.25 packs/day    Types: Cigarettes  . Smokeless tobacco: Not on file     Comment: one cigarette daily  . Alcohol Use: No   OB History   Grav Para Term Preterm Abortions TAB SAB Ect Mult Living   2 2 2       2      Review of Systems  Constitutional: Negative for fever.  Respiratory: Negative for shortness of breath.   Cardiovascular: Negative for chest pain.  Musculoskeletal: Positive for  arthralgias.  Skin: Negative for rash and wound.  Neurological: Positive for numbness. Negative for headaches.  All other systems reviewed and are negative.    Allergies  Fish allergy and Oxycodone  Home Medications   Current Outpatient Rx  Name  Route  Sig  Dispense  Refill  . acetaminophen (TYLENOL) 500 MG tablet   Oral   Take 1,000 mg by mouth every 6 (six) hours as needed for pain.         Marland Kitchen aspirin 81 MG tablet   Oral   Take 81 mg by mouth daily.           . insulin glargine (LANTUS) 100 UNIT/ML injection   Subcutaneous   Inject 37 Units into the skin at bedtime.         . metFORMIN (GLUCOPHAGE-XR) 500 MG 24 hr tablet   Oral   Take 500 mg by mouth 2 (two) times daily. Pt states dr told her to increase to twice a day dosing if she could tolerate it, pt can tolerate.         . quinapril (ACCUPRIL) 5 MG tablet   Oral   Take 1 tablet (5 mg total) by mouth at bedtime.   90 tablet   11   . rosuvastatin (CRESTOR) 10 MG tablet   Oral   Take 1 tablet (  10 mg total) by mouth daily.   90 tablet   3    BP 102/77  Pulse 88  Temp(Src) 98.5 F (36.9 C)  Resp 15  Ht 4\' 11"  (1.499 m)  Wt 195 lb 4 oz (88.565 kg)  BMI 39.41 kg/m2  SpO2 100%  LMP 06/11/2013 Physical Exam  Nursing note and vitals reviewed. Constitutional: She appears well-developed and well-nourished. No distress.  HENT:  Head: Atraumatic.  Eyes: Conjunctivae are normal.  Neck: Neck supple.  Musculoskeletal: She exhibits tenderness (Left shoulder: Tenderness to anterior deltoid, and also mild tenderness to the bicipital groove and medial aspect of the bicep.  No deformity, FROM, no rash.  ).  L arm with mildly decreased grip strength as compare to R, but normal sensation to arm.  Radial pulses 2+.   Normal Hawkins test  Neurological: She is alert.  Skin: No rash noted.  Psychiatric: She has a normal mood and affect.    ED Course  Procedures (including critical care time)  10:52  AM Patient here with left shoulder pain appears to be chronic and not suggestive of ACS. Does have mildly decreased grip strength and subjective paresthesia along the radial aspect of her forearm but intact radial brachialis deep tendon reflex. No significant edema to suggest DVT. Pulses intact. No trauma to suggest acute fractures or dislocation. No chest pain or shortness of breath. Care discussed with my attending, will treat patient for shoulder impingement. Patient to followup with her PCP for further care, return precautions discussed. Labs Review Labs Reviewed - No data to display Imaging Review No results found.  EKG Interpretation   None       MDM   1. Shoulder impingement syndrome, left    BP 102/77  Pulse 88  Temp(Src) 98.5 F (36.9 C)  Resp 15  Ht 4\' 11"  (1.499 m)  Wt 195 lb 4 oz (88.565 kg)  BMI 39.41 kg/m2  SpO2 100%  LMP 06/11/2013     Fayrene Helper, PA-C 07/05/13 1145

## 2013-07-05 NOTE — ED Notes (Signed)
Denies injury, pain worsens with use of LUE

## 2013-07-10 ENCOUNTER — Other Ambulatory Visit: Payer: Self-pay | Admitting: Family Medicine

## 2013-07-10 MED ORDER — INSULIN GLARGINE 100 UNIT/ML ~~LOC~~ SOLN: 37.0000 [IU] | Freq: Every day | SUBCUTANEOUS | Status: DC

## 2013-08-07 ENCOUNTER — Telehealth: Payer: Self-pay | Admitting: *Deleted

## 2013-08-07 ENCOUNTER — Other Ambulatory Visit: Payer: Self-pay | Admitting: *Deleted

## 2013-08-07 DIAGNOSIS — E1165 Type 2 diabetes mellitus with hyperglycemia: Secondary | ICD-10-CM

## 2013-08-07 DIAGNOSIS — IMO0002 Reserved for concepts with insufficient information to code with codable children: Secondary | ICD-10-CM

## 2013-08-07 NOTE — Telephone Encounter (Signed)
LM for pt to call back.  She is overdue for a fasting cholesterol check.  Please schedule this lab only if she calls back. Jazmin Hartsell,CMA

## 2013-08-08 ENCOUNTER — Other Ambulatory Visit: Payer: Self-pay

## 2013-08-10 ENCOUNTER — Other Ambulatory Visit: Payer: Self-pay

## 2013-08-10 DIAGNOSIS — IMO0002 Reserved for concepts with insufficient information to code with codable children: Secondary | ICD-10-CM

## 2013-08-10 DIAGNOSIS — E1165 Type 2 diabetes mellitus with hyperglycemia: Secondary | ICD-10-CM

## 2013-08-10 LAB — LDL CHOLESTEROL, DIRECT: LDL DIRECT: 90 mg/dL

## 2013-08-10 NOTE — Progress Notes (Signed)
D-LDL DONE TODAY Kristy [name]

## 2013-09-04 ENCOUNTER — Other Ambulatory Visit: Payer: Self-pay | Admitting: Family Medicine

## 2013-09-04 ENCOUNTER — Other Ambulatory Visit: Payer: Self-pay | Admitting: *Deleted

## 2013-09-04 MED ORDER — ROSUVASTATIN CALCIUM 10 MG PO TABS: 10.0000 mg | ORAL_TABLET | Freq: Every day | ORAL | Status: DC

## 2013-09-04 NOTE — Telephone Encounter (Signed)
Will need to fax written Rx to [facility] at [phone] (90-day supply).  Burna Forts, BSN, RN-BC

## 2013-09-26 ENCOUNTER — Ambulatory Visit: Payer: Self-pay

## 2013-10-12 ENCOUNTER — Ambulatory Visit: Payer: Self-pay | Admitting: Family Medicine

## 2013-10-18 ENCOUNTER — Telehealth: Payer: Self-pay | Admitting: Family Medicine

## 2013-10-18 ENCOUNTER — Other Ambulatory Visit: Payer: Self-pay | Admitting: *Deleted

## 2013-10-18 MED ORDER — INSULIN GLARGINE 100 UNIT/ML ~~LOC~~ SOLN: 35.0000 [IU] | Freq: Every day | SUBCUTANEOUS | Status: DC

## 2013-10-18 NOTE — Telephone Encounter (Signed)
To [facility] red team - please call pt and let her know I have refilled her lantus but she needs to schedule an appointment to follow up on her diabetes. Thanks!  Leeanne Rio, MD

## 2013-10-18 NOTE — Telephone Encounter (Signed)
[facility]ed [facility]

## 2013-10-18 NOTE — Telephone Encounter (Signed)
Attempted phone call, mailbox not set up.  Daqwan Dougal, Loralyn Freshwater, [place]

## 2013-10-19 NOTE — Telephone Encounter (Signed)
Pt notified and is agreeable.  Josejulian Tarango, Loralyn Freshwater, [place]

## 2013-11-09 ENCOUNTER — Other Ambulatory Visit: Payer: Self-pay | Admitting: *Deleted

## 2013-12-01 ENCOUNTER — Other Ambulatory Visit: Payer: Self-pay | Admitting: Family Medicine

## 2013-12-01 ENCOUNTER — Telehealth: Payer: Self-pay | Admitting: Family Medicine

## 2013-12-01 NOTE — Telephone Encounter (Signed)
Will fwd to PCP.   Jaiel Saraceno L Worley Radermacher, CMA   

## 2013-12-01 NOTE — Telephone Encounter (Signed)
Pt called and needs a refill on her metformin sent to [facility]. jw

## 2013-12-04 ENCOUNTER — Other Ambulatory Visit: Payer: Self-pay | Admitting: Family Medicine

## 2013-12-04 NOTE — Telephone Encounter (Signed)
How much metformin is she taking? The previous order had multiple doses listed. I need to know the dose and frequency. Please call pt and ask. Thanks! Leeanne Rio, MD

## 2013-12-05 NOTE — Telephone Encounter (Signed)
Spoke with Kristy [name] at [facility] pharmacy--patient's last refill was for metformin ER 500 mg and it was last refilled on 10/28/13--sigs: take 1 tab daily and may increase to BID.  Will route info to Dr. Ardelia Mems and follow up with [facility] pharmacy tomorrow.  Burna Forts, BSN, RN-BC

## 2013-12-05 NOTE — Telephone Encounter (Signed)
Called patient and she says she is taking 500 MG BID.Jaymes Graff Busick

## 2013-12-06 ENCOUNTER — Other Ambulatory Visit: Payer: Self-pay | Admitting: *Deleted

## 2013-12-07 ENCOUNTER — Other Ambulatory Visit: Payer: Self-pay | Admitting: Family Medicine

## 2013-12-07 MED ORDER — METFORMIN HCL ER 500 MG PO TB24: 500.0000 mg | ORAL_TABLET | Freq: Two times a day (BID) | ORAL | Status: DC

## 2013-12-08 NOTE — Telephone Encounter (Signed)
[facility].  Leeanne Rio, MD

## 2013-12-25 ENCOUNTER — Other Ambulatory Visit: Payer: Self-pay | Admitting: *Deleted

## 2014-01-04 ENCOUNTER — Encounter (HOSPITAL_COMMUNITY): Payer: Self-pay | Admitting: Emergency Medicine

## 2014-01-04 ENCOUNTER — Emergency Department (HOSPITAL_COMMUNITY)
Admission: EM | Admit: 2014-01-04 | Discharge: 2014-01-05 | Disposition: A | Discharge status: Home / Self Care | Payer: Self-pay | Attending: Emergency Medicine | Admitting: Emergency Medicine

## 2014-01-04 DIAGNOSIS — E785 Hyperlipidemia, unspecified: Secondary | ICD-10-CM | POA: Insufficient documentation

## 2014-01-04 DIAGNOSIS — Z79899 Other long term (current) drug therapy: Secondary | ICD-10-CM | POA: Insufficient documentation

## 2014-01-04 DIAGNOSIS — G8929 Other chronic pain: Secondary | ICD-10-CM | POA: Insufficient documentation

## 2014-01-04 DIAGNOSIS — R739 Hyperglycemia, unspecified: Secondary | ICD-10-CM

## 2014-01-04 DIAGNOSIS — Z862 Personal history of diseases of the blood and blood-forming organs and certain disorders involving the immune mechanism: Secondary | ICD-10-CM | POA: Insufficient documentation

## 2014-01-04 DIAGNOSIS — Z7982 Long term (current) use of aspirin: Secondary | ICD-10-CM | POA: Insufficient documentation

## 2014-01-04 DIAGNOSIS — Z794 Long term (current) use of insulin: Secondary | ICD-10-CM | POA: Insufficient documentation

## 2014-01-04 DIAGNOSIS — Z87891 Personal history of nicotine dependence: Secondary | ICD-10-CM | POA: Insufficient documentation

## 2014-01-04 DIAGNOSIS — E119 Type 2 diabetes mellitus without complications: Secondary | ICD-10-CM | POA: Insufficient documentation

## 2014-01-04 DIAGNOSIS — Z87442 Personal history of urinary calculi: Secondary | ICD-10-CM | POA: Insufficient documentation

## 2014-01-04 LAB — I-STAT CHEM 8, ED
BUN: 11 mg/dL (ref 6–23)
CREATININE: 0.6 mg/dL (ref 0.50–1.10)
Calcium, Ion: 1.14 mmol/L (ref 1.12–1.23)
Chloride: 98 mEq/L (ref 96–112)
Glucose, Bld: 512 mg/dL — ABNORMAL HIGH (ref 70–99)
HEMATOCRIT: 38 % (ref 36.0–46.0)
Hemoglobin: 12.9 g/dL (ref 12.0–15.0)
Potassium: 5.9 mEq/L — ABNORMAL HIGH (ref 3.7–5.3)
SODIUM: 133 meq/L — AB (ref 137–147)
TCO2: 25 mmol/L (ref 0–100)

## 2014-01-04 LAB — BLOOD GAS, VENOUS
Acid-base deficit: 0.4 mmol/L (ref 0.0–2.0)
Bicarbonate: 24.2 mEq/L — ABNORMAL HIGH (ref 20.0–24.0)
O2 Saturation: 65.8 %
PCO2 VEN: 41.9 mmHg — AB (ref 45.0–50.0)
PH VEN: 7.38 — AB (ref 7.250–7.300)
Patient temperature: 98.6
TCO2: 21.9 mmol/L (ref 0–100)
pO2, Ven: 36.8 mmHg (ref 30.0–45.0)

## 2014-01-04 LAB — CBG MONITORING, ED: GLUCOSE-CAPILLARY: 544 mg/dL — AB (ref 70–99)

## 2014-01-04 MED ORDER — ROSUVASTATIN CALCIUM 10 MG PO TABS: 10.0000 mg | ORAL_TABLET | Freq: Every day | ORAL | Status: DC

## 2014-01-04 MED ORDER — SODIUM CHLORIDE 0.9 % IV BOLUS (SEPSIS)
1000.0000 mL | Freq: Once | INTRAVENOUS | Status: AC
  Administered 2014-01-04: 1000 mL via INTRAVENOUS

## 2014-01-04 NOTE — ED Notes (Signed)
Pt states having lower back pain, states hx of back pain, then also having L inner knee pain for "a while", pt denies injury. Pt ambulated to triage room w/o difficulty.

## 2014-01-04 NOTE — ED Provider Notes (Signed)
CSN: [id number]     Arrival date & time 01/04/14  2147 History   First MD Initiated Contact with Patient 01/04/14 2239     Chief Complaint  Patient presents with  . Back Pain  . Knee Pain  . Hyperglycemia     (Consider location/radiation/quality/duration/timing/severity/associated sxs/prior Treatment) HPI  Kristy [name] is a 48 y.o. female with past medical history significant for insulin-dependent diabetes, chronic back pain complaining of exacerbation of back pain and left knee pain worsening over the course of 4 days. Patient states she has been inconsistently taking her insulin because she recently found out her daughter was pregnant and her aunt passed away. She's been out of insulin for a day is going to pick up her prescription tomorrow. She denies chest pain, shortness of breath, vomiting, abdominal pain, fever, chills, rash. She's been taking acetaminophen and Goody powders for the back pain with no relief. Patient denies any change in bowel or bladder continence, fever, history of IV drug use, history of cancer, saddle anesthesia, inability to cannulate. She rates her pain at 9-1/2 out of 10.  Past Medical History  Diagnosis Date  . Diabetes mellitus type II   . Hyperlipidemia   . Chronic back pain   . Anemia   . Kidney stones    Past Surgical History  Procedure Laterality Date  . Cesarean section    . Tubal ligation    . Kidney stone surgery     Family History  Problem Relation Age of Onset  . Adopted: Yes   History  Substance Use Topics  . Smoking status: Former Smoker -- 0.25 packs/day    Types: Cigarettes  . Smokeless tobacco: Not on file     Comment: one cigarette daily  . Alcohol Use: No   OB History   Grav Para Term Preterm Abortions TAB SAB Ect Mult Living   2 2 2       2      Review of Systems  10 systems reviewed and found to be negative, except as noted in the HPI.   Allergies  Fish allergy and Oxycodone  Home Medications   Prior to Admission  medications   Medication Sig Start Date End Date Taking? Authorizing Provider  acetaminophen (TYLENOL) 500 MG tablet Take 1,000 mg by mouth every 6 (six) hours as needed for pain.   Yes Historical Provider, MD  aspirin 81 MG tablet Take 81 mg by mouth daily.     Yes Historical Provider, MD  insulin glargine (LANTUS) 100 UNIT/ML injection Inject 37 Units into the skin at bedtime.   Yes Historical Provider, MD  metFORMIN (GLUCOPHAGE-XR) 500 MG 24 hr tablet Take 1 tablet (500 mg total) by mouth 2 (two) times daily.   Yes Leeanne Rio, MD  rosuvastatin (CRESTOR) 10 MG tablet Take 1 tablet (10 mg total) by mouth daily.  12/25/14 Yes Leeanne Rio, MD  methocarbamol (ROBAXIN) 500 MG tablet Take 2 tablets (1,000 mg total) by mouth 4 (four) times daily as needed (Pain). 01/05/14   Rivers Hamrick, PA-C   BP 118/68  Pulse 74  Temp(Src) 98.8 F (37.1 C) (Oral)  Resp 18  SpO2 98%  LMP 12/13/2013 Physical Exam  Nursing note and vitals reviewed. Constitutional: She is oriented to person, place, and time. She appears well-developed and well-nourished. No distress.  HENT:  Head: Normocephalic and atraumatic.  Mouth/Throat: Oropharynx is clear and moist.  Eyes: Conjunctivae and EOM are normal. Pupils are equal, round, and reactive to light.  Neck: Normal range of motion. Neck supple.  Cardiovascular: Normal rate, regular rhythm and intact distal pulses.   Pulmonary/Chest: Effort normal and breath sounds normal. No stridor. No respiratory distress. She has no wheezes. She has no rales. She exhibits no tenderness.  Abdominal: Soft. Bowel sounds are normal.  Musculoskeletal: Normal range of motion. She exhibits tenderness.  No point tenderness to percussion of lumbar spinal processes.  No TTP or paraspinal muscular spasm. Extensor hallucis longus 5 out of 5, bilaterally    Left knee:  No deformity, erythema or abrasions. FROM. No effusion or warmth. +++crepitance. Anterior and posterior  drawer show no abnormal laxity. Stable to valgus and varus stress. Joint lines are non-tender. Neurovascularly intact. Pt ambulates with non-antalgic gait.     Neurological: She is alert and oriented to person, place, and time.  Psychiatric: She has a normal mood and affect.    ED Course  Procedures (including critical care time) Labs Review Labs Reviewed  BASIC METABOLIC PANEL - Abnormal; Notable for the following:    Sodium 132 (*)    Glucose, Bld 527 (*)    All other components within normal limits  CBC WITH DIFFERENTIAL - Abnormal; Notable for the following:    RBC 5.38 (*)    MCV 67.7 (*)    MCH 23.2 (*)    RDW 16.1 (*)    All other components within normal limits  BLOOD GAS, VENOUS - Abnormal; Notable for the following:    pH, Ven 7.380 (*)    pCO2, Ven 41.9 (*)    Bicarbonate 24.2 (*)    All other components within normal limits  URINALYSIS, ROUTINE W REFLEX MICROSCOPIC - Abnormal; Notable for the following:    Specific Gravity, Urine 1.034 (*)    Glucose, UA >1000 (*)    Hgb urine dipstick TRACE (*)    All other components within normal limits  CBG MONITORING, ED - Abnormal; Notable for the following:    Glucose-Capillary 544 (*)    All other components within normal limits  I-STAT CHEM 8, ED - Abnormal; Notable for the following:    Sodium 133 (*)    Potassium 5.9 (*)    Glucose, Bld 512 (*)    All other components within normal limits  CBG MONITORING, ED - Abnormal; Notable for the following:    Glucose-Capillary 360 (*)    All other components within normal limits  HEPATIC FUNCTION PANEL  URINE MICROSCOPIC-ADD ON    Imaging Review No results found.   EKG Interpretation None      MDM   Final diagnoses:  Chronic pain  Hyperglycemia without ketosis    Filed Vitals:   01/04/14 2203 01/05/14 0144  BP: 139/85 118/68  Pulse: 94 74  Temp: 97.9 F (36.6 C) 98.8 F (37.1 C)  TempSrc: Oral Oral  Resp: 18 18  SpO2: 96% 98%    Medications   sodium chloride 0.9 % bolus 1,000 mL (0 mLs Intravenous Stopped 01/05/14 0116)  insulin aspart (novoLOG) injection 10 Units (10 Units Subcutaneous Given 01/05/14 0020)  methocarbamol (ROBAXIN) tablet 1,000 mg (1,000 mg Oral Given 01/05/14 0126)    Kristy [name] is a 48 y.o. female presenting with patient of chronic low back pain patient was also reporting left knee pain, physical exam with no acute abnormalities, neuro exam nonfocal, no concern for cauda equinus, spinal epidural abscess. Patient has been intermittently compliant with her insulin, she has elevated blood glucose with normal anion gap. Patient is given fluid bolus  and insulin with normalization of her short of 360. 5 close compliance with medication regimen. Patient will be given Robaxin for pain control.  Evaluation does not show pathology that would require ongoing emergent intervention or inpatient treatment. Pt is hemodynamically stable and mentating appropriately. Discussed findings and plan with patient/guardian, who agrees with care plan. All questions answered. Return precautions discussed and outpatient follow up given.   Discharge Medication List as of 01/05/2014  1:39 AM    START taking these medications   Details  methocarbamol (ROBAXIN) 500 MG tablet Take 2 tablets (1,000 mg total) by mouth 4 (four) times daily as needed (Pain)., Starting 01/05/2014, Until Discontinued, [employer], PA-C 01/05/14 [phone]

## 2014-01-05 LAB — BASIC METABOLIC PANEL
BUN: 10 mg/dL (ref 6–23)
CO2: 21 mEq/L (ref 19–32)
Calcium: 9.7 mg/dL (ref 8.4–10.5)
Chloride: 98 mEq/L (ref 96–112)
Creatinine, Ser: 0.53 mg/dL (ref 0.50–1.10)
GLUCOSE: 527 mg/dL — AB (ref 70–99)
POTASSIUM: 4.1 meq/L (ref 3.7–5.3)
Sodium: 132 mEq/L — ABNORMAL LOW (ref 137–147)

## 2014-01-05 LAB — URINE MICROSCOPIC-ADD ON

## 2014-01-05 LAB — URINALYSIS, ROUTINE W REFLEX MICROSCOPIC
Bilirubin Urine: NEGATIVE
Glucose, UA: >1000 mg/dL — AB
Ketones, ur: NEGATIVE mg/dL
LEUKOCYTES UA: NEGATIVE
NITRITE: NEGATIVE
Protein, ur: NEGATIVE mg/dL
Specific Gravity, Urine: 1.034 — ABNORMAL HIGH (ref 1.005–1.030)
UROBILINOGEN UA: 0.2 mg/dL (ref 0.0–1.0)
pH: 6 (ref 5.0–8.0)

## 2014-01-05 LAB — CBC WITH DIFFERENTIAL/PLATELET
BASOS ABS: 0 10*3/uL (ref 0.0–0.1)
BASOS PCT: 0 % (ref 0–1)
Eosinophils Absolute: 0.1 10*3/uL (ref 0.0–0.7)
Eosinophils Relative: 1 % (ref 0–5)
HEMATOCRIT: 36.4 % (ref 36.0–46.0)
HEMOGLOBIN: 12.5 g/dL (ref 12.0–15.0)
Lymphocytes Relative: 29 % (ref 12–46)
Lymphs Abs: 2.3 10*3/uL (ref 0.7–4.0)
MCH: 23.2 pg — AB (ref 26.0–34.0)
MCHC: 34.3 g/dL (ref 30.0–36.0)
MCV: 67.7 fL — ABNORMAL LOW (ref 78.0–100.0)
MONO ABS: 0.4 10*3/uL (ref 0.1–1.0)
Monocytes Relative: 5 % (ref 3–12)
NEUTROS ABS: 5 10*3/uL (ref 1.7–7.7)
NEUTROS PCT: 65 % (ref 43–77)
Platelets: 328 10*3/uL (ref 150–400)
RBC: 5.38 MIL/uL — ABNORMAL HIGH (ref 3.87–5.11)
RDW: 16.1 % — ABNORMAL HIGH (ref 11.5–15.5)
WBC: 7.8 10*3/uL (ref 4.0–10.5)

## 2014-01-05 LAB — HEPATIC FUNCTION PANEL
ALK PHOS: 111 U/L (ref 39–117)
ALT: 11 U/L (ref 0–35)
AST: 12 U/L (ref 0–37)
Albumin: 3.6 g/dL (ref 3.5–5.2)
BILIRUBIN TOTAL: 0.5 mg/dL (ref 0.3–1.2)
Bilirubin, Direct: <0.2 mg/dL (ref 0.0–0.3)
Total Protein: 8 g/dL (ref 6.0–8.3)

## 2014-01-05 LAB — CBG MONITORING, ED: Glucose-Capillary: 360 mg/dL — ABNORMAL HIGH (ref 70–99)

## 2014-01-05 MED ORDER — INSULIN ASPART 100 UNIT/ML ~~LOC~~ SOLN
10.0000 [IU] | Freq: Once | SUBCUTANEOUS | Status: AC
  Administered 2014-01-05: 10 [IU] via SUBCUTANEOUS
  Filled 2014-01-05: qty 1

## 2014-01-05 MED ORDER — METHOCARBAMOL 500 MG PO TABS
1000.0000 mg | ORAL_TABLET | Freq: Once | ORAL | Status: AC
  Administered 2014-01-05: 1000 mg via ORAL
  Filled 2014-01-05: qty 2

## 2014-01-05 MED ORDER — METHOCARBAMOL 500 MG PO TABS: 1000.0000 mg | ORAL_TABLET | Freq: Four times a day (QID) | ORAL | Status: DC | PRN

## 2014-01-05 NOTE — ED Provider Notes (Signed)
Medical screening examination/treatment/procedure(s) were performed by non-physician practitioner and as supervising physician I was immediately available for consultation/collaboration.   EKG Interpretation None       Kalman Drape, MD 01/05/14 [phone]

## 2014-01-05 NOTE — Discharge Instructions (Signed)
Stay well Hydrated and take your insulin as directed.   Please follow with your primary care doctor in the next 2 days for a check-up. They must obtain records for further management.   Do not hesitate to return to the Emergency Department for any new, worsening or concerning symptoms.   For pain control you may take up to 800mg  of Motrin (also known as ibuprofen). That is usually 4 over the counter pills,  3 times a day. Take with food to minimize stomach irritation   You can also take  tylenol (acetaminophen) 975mg  (this is 3 over the counter pills) four times a day. Do not drink alcohol or combine with other medications that have acetaminophen as an ingredient (Read the labels!).    For breakthrough pain you may take Robaxin. Do not drink alcohol, drive or operate heavy machinery when taking Robaxin.  Hyperglycemia Hyperglycemia occurs when the glucose (sugar) in your blood is too high. Hyperglycemia can happen for many reasons, but it most often happens to people who do not know they have diabetes or are not managing their diabetes properly.  CAUSES  Whether you have diabetes or not, there are other causes of hyperglycemia. Hyperglycemia can occur when you have diabetes, but it can also occur in other situations that you might not be as aware of, such as: Diabetes  If you have diabetes and are having problems controlling your blood glucose, hyperglycemia could occur because of some of the following reasons:  Not following your meal plan.  Not taking your diabetes medications or not taking it properly.  Exercising less or doing less activity than you normally do.  Being sick. Pre-diabetes  This cannot be ignored. Before people develop Type 2 diabetes, they almost always have "pre-diabetes." This is when your blood glucose levels are higher than normal, but not yet high enough to be diagnosed as diabetes. Research has shown that some long-term damage to the body, especially the heart  and circulatory system, may already be occurring during pre-diabetes. If you take action to manage your blood glucose when you have pre-diabetes, you may delay or prevent Type 2 diabetes from developing. Stress  If you have diabetes, you may be "diet" controlled or on oral medications or insulin to control your diabetes. However, you may find that your blood glucose is higher than usual in the hospital whether you have diabetes or not. This is often referred to as "stress hyperglycemia." Stress can elevate your blood glucose. This happens because of hormones put out by the body during times of stress. If stress has been the cause of your high blood glucose, it can be followed regularly by your caregiver. That way he/she can make sure your hyperglycemia does not continue to get worse or progress to diabetes. Steroids  Steroids are medications that act on the infection fighting system (immune system) to block inflammation or infection. One side effect can be a rise in blood glucose. Most people can produce enough extra insulin to allow for this rise, but for those who cannot, steroids make blood glucose levels go even higher. It is not unusual for steroid treatments to "uncover" diabetes that is developing. It is not always possible to determine if the hyperglycemia will go away after the steroids are stopped. A special blood test called an A1c is sometimes done to determine if your blood glucose was elevated before the steroids were started. SYMPTOMS  Thirsty.  Frequent urination.  Dry mouth.  Blurred vision.  Tired or fatigue.  Weakness.  Sleepy.  Tingling in feet or leg. DIAGNOSIS  Diagnosis is made by monitoring blood glucose in one or all of the following ways:  A1c test. This is a chemical found in your blood.  Fingerstick blood glucose monitoring.  Laboratory results. TREATMENT  First, knowing the cause of the hyperglycemia is important before the hyperglycemia can be treated.  Treatment may include, but is not be limited to:  Education.  Change or adjustment in medications.  Change or adjustment in meal plan.  Treatment for an illness, infection, etc.  More frequent blood glucose monitoring.  Change in exercise plan.  Decreasing or stopping steroids.  Lifestyle changes. HOME CARE INSTRUCTIONS   Test your blood glucose as directed.  Exercise regularly. Your caregiver will give you instructions about exercise. Pre-diabetes or diabetes which comes on with stress is helped by exercising.  Eat wholesome, balanced meals. Eat often and at regular, fixed times. Your caregiver or nutritionist will give you a meal plan to guide your sugar intake.  Being at an ideal weight is important. If needed, losing as little as 10 to 15 pounds may help improve blood glucose levels. SEEK MEDICAL CARE IF:   You have questions about medicine, activity, or diet.  You continue to have symptoms (problems such as increased thirst, urination, or weight gain). SEEK IMMEDIATE MEDICAL CARE IF:   You are vomiting or have diarrhea.  Your breath smells fruity.  You are breathing faster or slower.  You are very sleepy or incoherent.  You have numbness, tingling, or pain in your feet or hands.  You have chest pain.  Your symptoms get worse even though you have been following your caregiver's orders.  If you have any other questions or concerns. Document Released: 01/06/2001 Document Revised: 10/05/2011 Document Reviewed: 11/09/2011 [facility] Patient Information 2014 [place], [place].

## 2014-02-06 ENCOUNTER — Other Ambulatory Visit: Payer: Self-pay | Admitting: *Deleted

## 2014-02-09 ENCOUNTER — Other Ambulatory Visit: Payer: Self-pay | Admitting: Family Medicine

## 2014-02-09 MED ORDER — INSULIN GLARGINE 100 UNIT/ML ~~LOC~~ SOLN: 37.0000 [IU] | Freq: Every day | SUBCUTANEOUS | Status: DC

## 2014-02-12 ENCOUNTER — Other Ambulatory Visit: Payer: Self-pay | Admitting: *Deleted

## 2014-02-12 ENCOUNTER — Other Ambulatory Visit: Payer: Self-pay | Admitting: Family Medicine

## 2014-02-12 MED ORDER — ROSUVASTATIN CALCIUM 10 MG PO TABS: 10.0000 mg | ORAL_TABLET | Freq: Every day | ORAL | Status: DC

## 2014-02-13 ENCOUNTER — Ambulatory Visit: Payer: Self-pay | Admitting: Family Medicine

## 2014-02-15 ENCOUNTER — Telehealth: Payer: Self-pay | Admitting: *Deleted

## 2014-02-15 NOTE — Telephone Encounter (Signed)
I do not know exactly what pt is taking, as I last saw her in early November and asked her to follow up with me in 3 weeks but she has yet to come back in. At that visit, we planned for her to take 35 units per day.  [place] red team or [place], please call patient and ask how much insulin she is taking in order to clarify with [facility].  Thanks! Leeanne Rio, MD

## 2014-02-15 NOTE — Telephone Encounter (Signed)
[facility] informed that Pt's Lantus is 35 Units at bedtime.  Derl Barrow, RN

## 2014-02-15 NOTE — Telephone Encounter (Signed)
Please advise pharmacy then that her dose is 35 units.  Leeanne Rio, MD

## 2014-02-15 NOTE — Telephone Encounter (Signed)
Unable to leave a voice message for pt on home phone.  Derl Barrow, RN

## 2014-02-15 NOTE — Telephone Encounter (Signed)
Received fax from [facility] needing to verify pt's Lantus dosage.  Is pt currently taking 35 Units or 37 Units QHS?  Derl Barrow, RN

## 2014-02-15 NOTE — Telephone Encounter (Signed)
Pt called back to verify that she is taking 35 units. jw

## 2014-03-08 ENCOUNTER — Ambulatory Visit: Payer: Self-pay | Admitting: Family Medicine

## 2014-03-12 ENCOUNTER — Other Ambulatory Visit: Payer: Self-pay | Admitting: *Deleted

## 2014-03-21 MED ORDER — ROSUVASTATIN CALCIUM 10 MG PO TABS: 10.0000 mg | ORAL_TABLET | Freq: Every day | ORAL | Status: DC

## 2014-03-28 ENCOUNTER — Ambulatory Visit (INDEPENDENT_AMBULATORY_CARE_PROVIDER_SITE_OTHER): Payer: Self-pay | Admitting: Family Medicine

## 2014-03-28 ENCOUNTER — Encounter: Payer: Self-pay | Admitting: Family Medicine

## 2014-03-28 VITALS — BP 128/85 | HR 93 | Temp 98.6°F | Ht 59.0 in | Wt 192.1 lb

## 2014-03-28 DIAGNOSIS — E1165 Type 2 diabetes mellitus with hyperglycemia: Secondary | ICD-10-CM

## 2014-03-28 DIAGNOSIS — M25512 Pain in left shoulder: Secondary | ICD-10-CM

## 2014-03-28 DIAGNOSIS — IMO0002 Reserved for concepts with insufficient information to code with codable children: Secondary | ICD-10-CM

## 2014-03-28 DIAGNOSIS — M25519 Pain in unspecified shoulder: Secondary | ICD-10-CM

## 2014-03-28 DIAGNOSIS — Z23 Encounter for immunization: Secondary | ICD-10-CM

## 2014-03-28 DIAGNOSIS — IMO0001 Reserved for inherently not codable concepts without codable children: Secondary | ICD-10-CM

## 2014-03-28 LAB — POCT GLYCOSYLATED HEMOGLOBIN (HGB A1C): Hemoglobin A1C: 10.9

## 2014-03-28 LAB — HM DIABETES EYE EXAM

## 2014-03-28 MED ORDER — INSULIN GLARGINE 100 UNITS/ML SOLOSTAR PEN: 40.0000 [IU] | PEN_INJECTOR | Freq: Every day | SUBCUTANEOUS | Status: DC

## 2014-03-28 MED ORDER — INSULIN PEN NEEDLE 29G X 12.7MM MISC: Status: DC

## 2014-03-28 NOTE — Assessment & Plan Note (Addendum)
A1c 10.9, obviously poorly controlled. Will increase lantus to 40 units at night, and gradually have her increase up to 45 units at night. F/u in 1 month for further titration of lantus. rx given for lantus pens since she prefers these.  Cardiac: on aspirin, statin. Check lipids and CMET today. Renal: urine microalbumin ordered today Eye: retinal imaging done today Foot: done today, decreased sensation on plantar surface of feet, advised pt look at her feet daily for sores

## 2014-03-28 NOTE — Patient Instructions (Signed)
It was great to see you again today!  Diabetes: -increase insulin to 40 units at night. If you tolerate this okay without low sugars, gradually increase up to 43 and then to 45 -we did your eye and foot exam today -I prescribed the insulin pens for you -follow up with me in 1 month  Shoulder pain: -I'm not sure what is causing this -continue to monitor, and if it worsens or becomes more frequent return for a visit  I will see you in 1 month.  Be well, Dr. Ardelia Mems

## 2014-03-28 NOTE — Progress Notes (Signed)
Patient ID: Kristy [name], female   DOB: 08-21-1965, 48 y.o.   MRN: [id number]  HPI:  Diabetes: blood sugar meter is in storage right now. Last checked CBG one week ago. Gets over 200 regularly. Lowest she got was 149. Takes Lantus 37 units at night. Has not been to eye doctor within the last year. 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  Shoulder pain: has left sided shoulder pain once every 2-3 weeks, lasting about 10 mins at a time. It's a sharp pain. No chest pain or shortness of breath. Happens at random, no triggers. No injury to the shoulder.   ROS: See HPI.  [place]: hx GERD, HLD, T2DM  PHYSICAL EXAM: BP 128/85  Pulse 93  Temp(Src) 98.6 F (37 C) (Oral)  Ht 4\' 11"  (1.499 m)  Wt 192 lb 1.6 oz (87.136 kg)  BMI 38.78 kg/m2  LMP 03/19/2014 Gen: NAD HEENT: NCAT, full ROM of neck Heart: RRR, no murmurs Lungs: CTAB, NWOB Neuro: grossly nonfocal, speech normal Ext: No appreciable lower extremity edema bilaterally. Normal strength in bilat upper extremities. Left shoulder with full ROM. No pain with axial loading of head. Negative neers. 2+ DP pulses bilat. No sores on feet. Some decr sensation with monofilament testing bilaterally.  ASSESSMENT/PLAN:  DM (diabetes mellitus), type 2, uncontrolled A1c 10.9, obviously poorly controlled. Will increase lantus to 40 units at night, and gradually have her increase up to 45 units at night. F/u in 1 month for further titration of lantus. rx given for lantus pens since she prefers these.  Cardiac: on aspirin, statin. Check lipids and CMET today. Renal: urine microalbumin ordered today Eye: retinal imaging done today Foot: done today, decreased sensation on plantar surface of feet, advised pt look at her feet daily for sores   Shoulder pain Etiology not clear. No obvious impingement syndrome or other pathology on exam. Recommended pt continue monitoring this pain and if it worsens or occurs more frequently to return to  be re-evaluated.   FOLLOW UP: F/u in 1 month for diabetes  [place] J. Ardelia Mems, [place]

## 2014-03-29 LAB — COMPREHENSIVE METABOLIC PANEL
ALBUMIN: 4.1 g/dL (ref 3.5–5.2)
ALK PHOS: 92 U/L (ref 39–117)
ALT: 11 U/L (ref 0–35)
AST: 11 U/L (ref 0–37)
BUN: 8 mg/dL (ref 6–23)
CO2: 26 mEq/L (ref 19–32)
Calcium: 9.7 mg/dL (ref 8.4–10.5)
Chloride: 98 mEq/L (ref 96–112)
Creat: 0.65 mg/dL (ref 0.50–1.10)
GLUCOSE: 319 mg/dL — AB (ref 70–99)
POTASSIUM: 3.9 meq/L (ref 3.5–5.3)
Sodium: 133 mEq/L — ABNORMAL LOW (ref 135–145)
Total Bilirubin: 1 mg/dL (ref 0.2–1.2)
Total Protein: 7.5 g/dL (ref 6.0–8.3)

## 2014-03-29 LAB — LIPID PANEL
Cholesterol: 173 mg/dL (ref 0–200)
HDL: 35 mg/dL — ABNORMAL LOW (ref >39)
LDL CALC: 102 mg/dL — AB (ref 0–99)
TRIGLYCERIDES: 180 mg/dL — AB (ref <150)
Total CHOL/HDL Ratio: 4.9 Ratio
VLDL: 36 mg/dL (ref 0–40)

## 2014-03-29 LAB — MICROALBUMIN / CREATININE URINE RATIO
CREATININE, URINE: 54.3 mg/dL
MICROALB UR: 2.23 mg/dL — AB (ref 0.00–1.89)
Microalb Creat Ratio: 41.1 mg/g — ABNORMAL HIGH (ref 0.0–30.0)

## 2014-03-30 ENCOUNTER — Encounter: Payer: Self-pay | Admitting: Family Medicine

## 2014-04-10 ENCOUNTER — Ambulatory Visit: Payer: Self-pay

## 2014-04-18 ENCOUNTER — Ambulatory Visit: Payer: Self-pay

## 2014-04-23 ENCOUNTER — Encounter: Payer: Self-pay | Admitting: Family Medicine

## 2014-04-23 ENCOUNTER — Ambulatory Visit: Payer: Self-pay

## 2014-04-23 NOTE — Progress Notes (Signed)
Pt has the orange card now and need an referral for an dentist best number to reach is [phone].

## 2014-04-23 NOTE — Progress Notes (Signed)
Will forward to PCP to place referral.

## 2014-04-24 ENCOUNTER — Other Ambulatory Visit: Payer: Self-pay | Admitting: Family Medicine

## 2014-04-24 DIAGNOSIS — Z0000 Encounter for general adult medical examination without abnormal findings: Secondary | ICD-10-CM

## 2014-04-24 NOTE — Progress Notes (Signed)
Referral entered. Please inform patient that it will likely be a several month long wait before she is scheduled for an appointment. Leeanne Rio, MD

## 2014-04-25 NOTE — Progress Notes (Signed)
Patient informed that copr of updated orange card is needed before referral can be processed.

## 2014-04-30 ENCOUNTER — Ambulatory Visit: Payer: Self-pay | Admitting: Family Medicine

## 2014-05-18 ENCOUNTER — Telehealth: Payer: Self-pay | Admitting: Family Medicine

## 2014-05-18 DIAGNOSIS — E1121 Type 2 diabetes mellitus with diabetic nephropathy: Secondary | ICD-10-CM

## 2014-05-18 MED ORDER — LISINOPRIL 5 MG PO TABS: 5.0000 mg | ORAL_TABLET | Freq: Every day | ORAL | Status: DC

## 2014-05-18 NOTE — Telephone Encounter (Signed)
[place] red team,  Please call pt and let her know that I recommend she take lisinopril 5mg  daily to protect her kidneys with her diabetes. [facility]. I planned to talk with her about this at her recent appt but she no-showed. I would like her to start this medicine and come back in 2 weeks to recheck her kidney function to be sure it's tolerating the medicine.  Thanks, Leeanne Rio, MD

## 2014-05-18 NOTE — Telephone Encounter (Signed)
Pt informed to start taking medication and come back in 2 weeks to recheck kidney function. Sasha Rueth CMA

## 2014-05-24 ENCOUNTER — Other Ambulatory Visit: Payer: Self-pay | Admitting: *Deleted

## 2014-05-24 NOTE — Telephone Encounter (Signed)
[facility] requesting refill on Metformin ER.  Kristy [name], [name]

## 2014-05-25 MED ORDER — METFORMIN HCL ER 500 MG PO TB24: 500.0000 mg | ORAL_TABLET | Freq: Two times a day (BID) | ORAL | Status: DC

## 2014-05-28 ENCOUNTER — Encounter: Payer: Self-pay | Admitting: Family Medicine

## 2014-06-14 ENCOUNTER — Ambulatory Visit (INDEPENDENT_AMBULATORY_CARE_PROVIDER_SITE_OTHER): Payer: Self-pay | Admitting: Family Medicine

## 2014-06-14 ENCOUNTER — Encounter: Payer: Self-pay | Admitting: Family Medicine

## 2014-06-14 VITALS — BP 104/71 | HR 94 | Temp 98.4°F | Ht 59.0 in | Wt 194.0 lb

## 2014-06-14 DIAGNOSIS — E1165 Type 2 diabetes mellitus with hyperglycemia: Secondary | ICD-10-CM

## 2014-06-14 DIAGNOSIS — M25562 Pain in left knee: Secondary | ICD-10-CM

## 2014-06-14 DIAGNOSIS — E1121 Type 2 diabetes mellitus with diabetic nephropathy: Secondary | ICD-10-CM

## 2014-06-14 DIAGNOSIS — IMO0002 Reserved for concepts with insufficient information to code with codable children: Secondary | ICD-10-CM

## 2014-06-14 MED ORDER — INSULIN GLARGINE 100 UNITS/ML SOLOSTAR PEN: 43.0000 [IU] | PEN_INJECTOR | Freq: Every day | SUBCUTANEOUS | Status: DC

## 2014-06-14 MED ORDER — TRAMADOL HCL 50 MG PO TABS: 50.0000 mg | ORAL_TABLET | Freq: Three times a day (TID) | ORAL | Status: DC | PRN

## 2014-06-14 NOTE — Patient Instructions (Addendum)
It was great to see you again today!  For diabetes: -increase lantus to 43 units daily -keep checking sugars -return in 3-4 weeks to re-evaluate  For knee pain: -tramadol every 8hrs as needed. Might make you sleepy so use caution -checking xrays -we'll follow up on this in 3-4 weeks when you return  Schedule a separate health maintenance visit (routine yearly physical). At this visit we will make sure you are up to date on all your important health maintenance items.   Be well, Dr. Ardelia Mems

## 2014-06-14 NOTE — Progress Notes (Signed)
Patient ID: Kristy [name], female   DOB: March 01, 1966, 48 y.o.   MRN: [id number]  HPI:  Diabetes: using lantus 40 units at night. Works third shift. Misses about lantus dose about 1 day per week. Checking fasting CBG's regularly at a friend's house (her meter is in storage). Generally gets around 200, sometimes in 150's. No problems with hypoglycemia. Taking lisinopril 5mg  daily for renal protection, tolerating this well. ROS: Occasional NON-exertional chest pain that lasts no more than 2 minutes, happening about once every few weeks. Not troublesome to pt, not associated with SOB/nausea/sweating. Also has occ palpitations lasting up to 5 minutes, happening once every few months. Similarly this is not troublesome to pt.  L knee pain: has had hx of this in the past, worse in the last month. About 1-2 times per week says her knee gives out and she falls or stumbles. No swelling, erythema, or warmth. Located on medial aspect of L knee. Not constant or every day, but comes on at random without identifiable triggers. Has tried tylenol & heating pad, which have helped a little.  ROS: See HPI.  [place]: GERD, HLD, T2DM  PHYSICAL EXAM: BP 104/71 mmHg  Pulse 94  Temp(Src) 98.4 F (36.9 C) (Oral)  Ht 4\' 11"  (1.499 m)  Wt 194 lb (87.998 kg)  BMI 39.16 kg/m2  LMP 05/31/2014 Gen: NAD, pleasant, cooperative HEENT: NCAT Heart: RRR no murmurs Lungs: CTAB, NWOB Neuro: grossly nonfocal speech normal Ext: No appreciable lower extremity edema bilaterally. L knee without effusion, erythema, or trauma. No crepitus with passive flexion and extension. Full strength with flexion & extension bilat. MCL & LCL intact. Negative lachmans. Negative mcmurrays. No joint line tenderness.  ASSESSMENT/PLAN:  Health maintenance:  -advised to schedule health maintenance visit as pt is behind on multiple health maintenance items.  DM (diabetes mellitus), type 2, uncontrolled Seems to have slightly better control of sugars since  increasing to 40 units of lantus daily. Will now increase to 43 units daily. F/u in 3-4 weeks to eval for improvement and continue titration.  Left knee pain Likely some component of osteoarthritis. Will check xrays and rx tramadol for pain. F/u in 3-4 weeks to review xrays & decide on further plan. Possibly could benefit from steroid injection.   FOLLOW UP: F/u in 3-4 weeks for diabetes and knee Schedule separate HM visit  [place] J. Ardelia Mems, [place]

## 2014-06-15 LAB — BASIC METABOLIC PANEL
BUN: 8 mg/dL (ref 6–23)
CHLORIDE: 100 meq/L (ref 96–112)
CO2: 26 mEq/L (ref 19–32)
CREATININE: 0.65 mg/dL (ref 0.50–1.10)
Calcium: 9.4 mg/dL (ref 8.4–10.5)
GLUCOSE: 261 mg/dL — AB (ref 70–99)
POTASSIUM: 4.4 meq/L (ref 3.5–5.3)
Sodium: 136 mEq/L (ref 135–145)

## 2014-06-23 DIAGNOSIS — M25562 Pain in left knee: Secondary | ICD-10-CM | POA: Insufficient documentation

## 2014-06-23 NOTE — Assessment & Plan Note (Signed)
Likely some component of osteoarthritis. Will check xrays and rx tramadol for pain. F/u in 3-4 weeks to review xrays & decide on further plan. Possibly could benefit from steroid injection.

## 2014-06-23 NOTE — Assessment & Plan Note (Signed)
Seems to have slightly better control of sugars since increasing to 40 units of lantus daily. Will now increase to 43 units daily. F/u in 3-4 weeks to eval for improvement and continue titration.

## 2014-07-10 ENCOUNTER — Encounter (HOSPITAL_COMMUNITY): Payer: Self-pay | Admitting: *Deleted

## 2014-07-10 ENCOUNTER — Emergency Department (HOSPITAL_COMMUNITY)
Admission: EM | Admit: 2014-07-10 | Discharge: 2014-07-11 | Disposition: A | Discharge status: Home / Self Care | Payer: Self-pay | Attending: Emergency Medicine | Admitting: Emergency Medicine

## 2014-07-10 ENCOUNTER — Emergency Department (HOSPITAL_COMMUNITY): Payer: Self-pay

## 2014-07-10 DIAGNOSIS — E785 Hyperlipidemia, unspecified: Secondary | ICD-10-CM | POA: Insufficient documentation

## 2014-07-10 DIAGNOSIS — J069 Acute upper respiratory infection, unspecified: Secondary | ICD-10-CM | POA: Insufficient documentation

## 2014-07-10 DIAGNOSIS — E1165 Type 2 diabetes mellitus with hyperglycemia: Secondary | ICD-10-CM | POA: Insufficient documentation

## 2014-07-10 DIAGNOSIS — Z87891 Personal history of nicotine dependence: Secondary | ICD-10-CM | POA: Insufficient documentation

## 2014-07-10 DIAGNOSIS — Z7982 Long term (current) use of aspirin: Secondary | ICD-10-CM | POA: Insufficient documentation

## 2014-07-10 DIAGNOSIS — R059 Cough, unspecified: Secondary | ICD-10-CM

## 2014-07-10 DIAGNOSIS — D649 Anemia, unspecified: Secondary | ICD-10-CM | POA: Insufficient documentation

## 2014-07-10 DIAGNOSIS — Z87442 Personal history of urinary calculi: Secondary | ICD-10-CM | POA: Insufficient documentation

## 2014-07-10 DIAGNOSIS — Z79899 Other long term (current) drug therapy: Secondary | ICD-10-CM | POA: Insufficient documentation

## 2014-07-10 DIAGNOSIS — B9789 Other viral agents as the cause of diseases classified elsewhere: Secondary | ICD-10-CM

## 2014-07-10 DIAGNOSIS — R739 Hyperglycemia, unspecified: Secondary | ICD-10-CM

## 2014-07-10 DIAGNOSIS — G8929 Other chronic pain: Secondary | ICD-10-CM | POA: Insufficient documentation

## 2014-07-10 DIAGNOSIS — J988 Other specified respiratory disorders: Secondary | ICD-10-CM

## 2014-07-10 DIAGNOSIS — Z794 Long term (current) use of insulin: Secondary | ICD-10-CM | POA: Insufficient documentation

## 2014-07-10 DIAGNOSIS — R05 Cough: Secondary | ICD-10-CM

## 2014-07-10 LAB — I-STAT CHEM 8, ED
BUN: 4 mg/dL — ABNORMAL LOW (ref 6–23)
CREATININE: 0.6 mg/dL (ref 0.50–1.10)
Calcium, Ion: 1.15 mmol/L (ref 1.12–1.23)
Chloride: 105 mEq/L (ref 96–112)
GLUCOSE: 199 mg/dL — AB (ref 70–99)
HEMATOCRIT: 35 % — AB (ref 36.0–46.0)
Hemoglobin: 11.9 g/dL — ABNORMAL LOW (ref 12.0–15.0)
POTASSIUM: 3.8 meq/L (ref 3.7–5.3)
SODIUM: 139 meq/L (ref 137–147)
TCO2: 23 mmol/L (ref 0–100)

## 2014-07-10 LAB — CBG MONITORING, ED: Glucose-Capillary: 191 mg/dL — ABNORMAL HIGH (ref 70–99)

## 2014-07-10 MED ORDER — IPRATROPIUM-ALBUTEROL 0.5-2.5 (3) MG/3ML IN SOLN
3.0000 mL | Freq: Once | RESPIRATORY_TRACT | Status: AC
  Administered 2014-07-10: 3 mL via RESPIRATORY_TRACT
  Filled 2014-07-10: qty 3

## 2014-07-10 MED ORDER — SODIUM CHLORIDE 0.9 % IV BOLUS (SEPSIS)
1000.0000 mL | Freq: Once | INTRAVENOUS | Status: AC
  Administered 2014-07-10: 1000 mL via INTRAVENOUS

## 2014-07-10 MED ORDER — ALBUTEROL SULFATE HFA 108 (90 BASE) MCG/ACT IN AERS
2.0000 | INHALATION_SPRAY | Freq: Once | RESPIRATORY_TRACT | Status: AC
  Administered 2014-07-10: 2 via RESPIRATORY_TRACT
  Filled 2014-07-10: qty 6.7

## 2014-07-10 NOTE — ED Notes (Signed)
Pt st's she had nausea and vomiting approx 1 1/2 weeks ago.  Pt st's this has subsided now has persistent cough that won't go away.

## 2014-07-10 NOTE — ED Provider Notes (Signed)
CSN: [id number]     Arrival date & time 07/10/14  2030 History   First MD Initiated Contact with Patient 07/10/14 2313     Chief Complaint  Patient presents with  . Cough     (Consider location/radiation/quality/duration/timing/severity/associated sxs/prior Treatment) HPI   Patient with hx DM presents with 9 days of dry cough, nasal congestion, hoarse voice, chills/sweats, N/V.  Has had decreased PO intake and has therefore not taken diabetes medications.  Has chest soreness only with cough, occasional SOB.  Denies abdominal pain, change in bowel habits, dysuria, urinary frequency or urgency.  Thinks she may actually be urinating less because of all the vomiting and decreased PO intake. Has taken nyquil, robitussin, mucinex, and cough drops without improvement.   Past Medical History  Diagnosis Date  . Diabetes mellitus type II   . Hyperlipidemia   . Chronic back pain   . Anemia   . Kidney stones    Past Surgical History  Procedure Laterality Date  . Cesarean section    . Tubal ligation    . Kidney stone surgery     Family History  Problem Relation Age of Onset  . Adopted: Yes   History  Substance Use Topics  . Smoking status: Former Smoker -- 0.25 packs/day    Types: Cigarettes  . Smokeless tobacco: Not on file     Comment: one cigarette daily  . Alcohol Use: No   OB History    Gravida Para Term Preterm AB TAB SAB Ectopic Multiple Living   2 2 2       2      Review of Systems  All other systems reviewed and are negative.     Allergies  Fish allergy and Oxycodone  Home Medications   Prior to Admission medications   Medication Sig Start Date End Date Taking? Authorizing Provider  acetaminophen (TYLENOL) 500 MG tablet Take 1,000 mg by mouth every 6 (six) hours as needed for pain.    Historical Provider, MD  aspirin 81 MG tablet Take 81 mg by mouth daily.      Historical Provider, MD  insulin glargine (LANTUS) 100 unit/mL SOPN Inject 0.43 mLs (43 Units total)  into the skin at bedtime. 06/14/14   Leeanne Rio, MD  Insulin Pen Needle (BD ULTRA-FINE PEN NEEDLES) 29G X 12.7MM MISC Check blood sugar 4x daily 03/28/14   Leeanne Rio, MD  lisinopril (PRINIVIL,ZESTRIL) 5 MG tablet Take 1 tablet (5 mg total) by mouth daily. 05/18/14   Leeanne Rio, MD  metFORMIN (GLUCOPHAGE-XR) 500 MG 24 hr tablet Take 1 tablet (500 mg total) by mouth 2 (two) times daily. 05/25/14   Leeanne Rio, MD  rosuvastatin (CRESTOR) 10 MG tablet Take 1 tablet (10 mg total) by mouth daily. 03/21/14   Leeanne Rio, MD  traMADol (ULTRAM) 50 MG tablet Take 1 tablet (50 mg total) by mouth every 8 (eight) hours as needed. 06/14/14   Leeanne Rio, MD   BP 114/73 mmHg  Pulse 94  Temp(Src) 99 F (37.2 C) (Oral)  Resp 20  Ht 4\' 11"  (1.499 m)  Wt 191 lb (86.637 kg)  BMI 38.56 kg/m2  SpO2 100%  LMP 05/31/2014 Physical Exam  Constitutional: She appears well-developed and well-nourished. No distress.  HENT:  Head: Normocephalic and atraumatic.  Eyes: Conjunctivae are normal.  Neck: Neck supple.  Cardiovascular: Normal rate and regular rhythm.   Pulmonary/Chest: Effort normal and breath sounds normal. No respiratory distress. She has no wheezes.  She has no rales.  Abdominal: Soft. She exhibits no distension. There is no tenderness. There is no rebound and no guarding.  Musculoskeletal: She exhibits no edema.  Neurological: She is alert.  Skin: She is not diaphoretic.  Nursing note and vitals reviewed.   ED Course  Procedures (including critical care time) Labs Review Labs Reviewed  CBG MONITORING, ED - Abnormal; Notable for the following:    Glucose-Capillary 191 (*)    All other components within normal limits  I-STAT CHEM 8, ED - Abnormal; Notable for the following:    BUN 4 (*)    Glucose, Bld 199 (*)    Hemoglobin 11.9 (*)    HCT 35.0 (*)    All other components within normal limits    Imaging Review Dg Chest 2 View  07/10/2014    CLINICAL DATA:  One week history of nonproductive cough  EXAM: CHEST  2 VIEW  COMPARISON:  August 21, 2009  FINDINGS: There is no edema or consolidation. The heart size and pulmonary vascularity are normal. No adenopathy. There is mild upper thoracic dextroscoliosis.  IMPRESSION: No edema or consolidation.   Electronically Signed   By: Lowella Grip M.D.   On: 07/10/2014 22:02     EKG Interpretation None       12:18 AM Tolerating PO fluids.  Feels improvement with neb treatment.  Lungs remain CTAB.    MDM   Final diagnoses:  Viral respiratory infection  Hyperglycemia    Afebrile, nontoxic patient with constellation of symptoms suggestive of viral syndrome.  No concerning findings on exam.  CBG 191.  IVF given.  CXR negative.  Chem 8 remarkable for hyperglycemia, mild anemia. Neb treatment, albuterol given with improvement. Discharged home with supportive care, PCP follow up.  D/C home with tussionex, albuterol.  PCP follow up.   Discussed result, findings, treatment, and follow up  with patient.  Pt given return precautions.  Pt verbalizes understanding and agrees with plan.        Clayton Bibles, PA-C 07/11/14 [place], DO 07/11/14 [id number]

## 2014-07-10 NOTE — ED Notes (Signed)
Pt in c/o cough and congestion, n/v over the last few days, states she hasn't been able to take her medication like normal at home, no distress noted

## 2014-07-11 ENCOUNTER — Ambulatory Visit: Payer: Self-pay | Admitting: Family Medicine

## 2014-07-11 MED ORDER — HYDROCOD POLST-CHLORPHEN POLST 10-8 MG/5ML PO LQCR: 5.0000 mL | Freq: Two times a day (BID) | ORAL | Status: DC | PRN

## 2014-07-11 NOTE — Discharge Instructions (Signed)
Read the information below.  Use the prescribed medication as directed.  Please discuss all new medications with your pharmacist.  You may return to the Emergency Department at any time for worsening condition or any new symptoms that concern you.  If you develop high fevers that do not resolve with tylenol or ibuprofen, you have difficulty swallowing or breathing, or you are unable to tolerate fluids by mouth, return to the ER for a recheck.      Viral Infections A virus is a type of germ. Viruses can cause:  Minor sore throats.  Aches and pains.  Headaches.  Runny nose.  Rashes.  Watery eyes.  Tiredness.  Coughs.  Loss of appetite.  Feeling sick to your stomach (nausea).  Throwing up (vomiting).  Watery poop (diarrhea). HOME CARE   Only take medicines as told by your doctor.  Drink enough water and fluids to keep your pee (urine) clear or pale yellow. Sports drinks are a good choice.  Get plenty of rest and eat healthy. Soups and broths with crackers or rice are fine. GET HELP RIGHT AWAY IF:   You have a very bad headache.  You have shortness of breath.  You have chest pain or neck pain.  You have an unusual rash.  You cannot stop throwing up.  You have watery poop that does not stop.  You cannot keep fluids down.  You or your child has a temperature by mouth above 102 F (38.9 C), not controlled by medicine.  Your baby is older than 3 months with a rectal temperature of 102 F (38.9 C) or higher.  Your baby is 66 months old or younger with a rectal temperature of 100.4 F (38 C) or higher. MAKE SURE YOU:   Understand these instructions.  Will watch this condition.  Will get help right away if you are not doing well or get worse. Document Released: 06/25/2008 Document Revised: 10/05/2011 Document Reviewed: 11/18/2010 [facility] Patient Information 2015 [place], [place]. This information is not intended to replace advice given to you by your health  care provider. Make sure you discuss any questions you have with your health care provider.  High Blood Sugar High blood sugar (hyperglycemia) means that the level of sugar in your blood is higher than it should be. Signs of high blood sugar include:  Feeling thirsty.  Frequent peeing (urinating).  Feeling tired or sleepy.  Dry mouth.  Vision changes.  Feeling weak.  Feeling hungry but losing weight.  Numbness and tingling in your hands or feet.  Headache. When you ignore these signs, your blood sugar may keep going up. These problems may get worse, and other problems may begin. HOME CARE  Check your blood sugars as told by your doctor. Write down the numbers with the date and time.  Take the right amount of insulin or diabetes pills at the right time. Write down the dose with date and time.  Refill your insulin or diabetes pills before running out.  Watch what you eat. Follow your meal plan.  Drink liquids without sugar, such as water. Check with your doctor if you have kidney or heart disease.  Follow your doctor's orders for exercise. Exercise at the same time of day.  Keep your doctor's appointments. GET HELP RIGHT AWAY IF:   You have trouble thinking or are confused.  You have fast breathing with fruity smelling breath.  You pass out (faint).  You have 2 to 3 days of high blood sugars and you  do not know why.  You have chest pain.  You are feeling sick to your stomach (nauseous) or throwing up (vomiting).  You have sudden vision changes. MAKE SURE YOU:   Understand these instructions.  Will watch your condition.  Will get help right away if you are not doing well or get worse. Document Released: 05/10/2009 Document Revised: 10/05/2011 Document Reviewed: 05/10/2009 [facility] Patient Information 2015 [place], [place]. This information is not intended to replace advice given to you by your health care provider. Make sure you discuss any questions you have  with your health care provider.

## 2014-08-01 ENCOUNTER — Ambulatory Visit: Payer: Self-pay | Admitting: Family Medicine

## 2014-08-21 ENCOUNTER — Other Ambulatory Visit: Payer: Self-pay | Admitting: *Deleted

## 2014-08-21 MED ORDER — INSULIN GLARGINE 100 UNITS/ML SOLOSTAR PEN: 43.0000 [IU] | PEN_INJECTOR | Freq: Every day | SUBCUTANEOUS | Status: DC

## 2014-09-13 ENCOUNTER — Ambulatory Visit (INDEPENDENT_AMBULATORY_CARE_PROVIDER_SITE_OTHER): Payer: Self-pay | Admitting: Family Medicine

## 2014-09-13 ENCOUNTER — Other Ambulatory Visit: Payer: Self-pay | Admitting: *Deleted

## 2014-09-13 ENCOUNTER — Encounter: Payer: Self-pay | Admitting: Family Medicine

## 2014-09-13 VITALS — BP 123/84 | HR 96 | Temp 98.5°F | Ht 59.0 in | Wt 194.7 lb

## 2014-09-13 DIAGNOSIS — L609 Nail disorder, unspecified: Secondary | ICD-10-CM

## 2014-09-13 DIAGNOSIS — E1165 Type 2 diabetes mellitus with hyperglycemia: Secondary | ICD-10-CM

## 2014-09-13 DIAGNOSIS — IMO0002 Reserved for concepts with insufficient information to code with codable children: Secondary | ICD-10-CM

## 2014-09-13 LAB — POCT GLYCOSYLATED HEMOGLOBIN (HGB A1C): Hemoglobin A1C: 10.5

## 2014-09-13 MED ORDER — INSULIN GLARGINE 100 UNITS/ML SOLOSTAR PEN
50.0000 [IU] | PEN_INJECTOR | Freq: Every day | SUBCUTANEOUS | Status: DC
Start: 2014-09-13 — End: 2015-03-05

## 2014-09-13 MED ORDER — LISINOPRIL 5 MG PO TABS: 5.0000 mg | ORAL_TABLET | Freq: Every day | ORAL | Status: DC

## 2014-09-13 MED ORDER — ROSUVASTATIN CALCIUM 10 MG PO TABS: 10.0000 mg | ORAL_TABLET | Freq: Every day | ORAL | Status: DC

## 2014-09-13 NOTE — Progress Notes (Signed)
Patient ID: Kristy [name], female   DOB: 10/08/1965, 49 y.o.   MRN: [id number]  HPI:  Having issues with toenail on R foot, second toe. Has noticed it for 1-2 weeks. Thinks the toenail might be coming loose slightly. Has stopped getting pedicures to avoid any issues with the toenail. Thinks the abnormality is growing out. No pain with it.  Diabetes: thinks her sugar is high because she's started itching. Checks sugars at home about twice a day. Has higher numbers in the morning. Low number is around 260s. Highest she gets is 400's if she eats a lot of sweets. Taking 43 units of Lantus once a day at night. Thinks she has already had the pneumonia shot.  On ROS: Occ tingling feeling in middle of chest. Not worse with exertion. Not SOB or nauseated or sweaty with this pain. Gets this no more than 1-2 times per week. Not brought on with exertion.  ROS: See HPI.  [place]: hx HLD, T2DM, obesity, GERD  PHYSICAL EXAM: BP 123/84 mmHg  Pulse 96  Temp(Src) 98.5 F (36.9 C) (Oral)  Ht 4\' 11"  (1.499 m)  Wt 194 lb 11.2 oz (88.315 kg)  BMI 39.30 kg/m2  LMP 08/31/2014 Gen: NAD HEENT: NCAT Heart: RRR no murmurs Lungs: CTAB NWOB Neuro: grossly nonfocal speech normal Ext: No appreciable lower extremity edema bilaterally, R second toenail with extra ridge across width of nail, appears to be growing out. nontender to palpation. 2+ DP pulse. No skin breakdown or signs of infection.  ASSESSMENT/PLAN:   Nail abnormality No signs of infection or serious issue with nail. Abnormality appears to be growing out. Continue to monitor, return if becomes painful or bothersome.   DM (diabetes mellitus), type 2, uncontrolled A1c today 10.5. Obviously not well controlled based on sugars patient reports. Will increase lantus to 50 units. F/u in 3 weeks to further titrate sugars. Patient has hx of not following up as directed. Advised that we can discuss her sugars by phone if getting to the office is difficult for  her.  Cardiac: on aspirin, refill crestor today Renal: on ace Eye: due in Sept for eye exam Foot: due in Sept for foot exam Immunizations: up to date on flu shot, pt reports already receiving pneumovax    Chest discomfort (tingling) does NOT sound cardiac in nature. Continue to monitor.  FOLLOW UP: F/u in 3 weeks for DM  [place] J. Ardelia Mems, [place]

## 2014-09-13 NOTE — Patient Instructions (Signed)
I refilled your cholesterol medicine and lisinopril Increase Lantus to 50 units at night Return in 3 weeks to follow up on your sugar If you can't come in, you can call by phone  Be well, Dr. Ardelia Mems

## 2014-09-14 MED ORDER — METFORMIN HCL ER 500 MG PO TB24: 500.0000 mg | ORAL_TABLET | Freq: Two times a day (BID) | ORAL | Status: DC

## 2014-09-16 DIAGNOSIS — L609 Nail disorder, unspecified: Secondary | ICD-10-CM | POA: Insufficient documentation

## 2014-09-16 NOTE — Assessment & Plan Note (Signed)
No signs of infection or serious issue with nail. Abnormality appears to be growing out. Continue to monitor, return if becomes painful or bothersome.

## 2014-09-16 NOTE — Assessment & Plan Note (Signed)
A1c today 10.5. Obviously not well controlled based on sugars patient reports. Will increase lantus to 50 units. F/u in 3 weeks to further titrate sugars. Patient has hx of not following up as directed. Advised that we can discuss her sugars by phone if getting to the office is difficult for her.  Cardiac: on aspirin, refill crestor today Renal: on ace Eye: due in Sept for eye exam Foot: due in Sept for foot exam Immunizations: up to date on flu shot, pt reports already receiving pneumovax

## 2014-10-03 ENCOUNTER — Ambulatory Visit: Payer: Self-pay | Admitting: Family Medicine

## 2014-10-17 ENCOUNTER — Ambulatory Visit: Payer: Self-pay | Admitting: Family Medicine

## 2014-11-02 ENCOUNTER — Ambulatory Visit: Payer: Self-pay | Admitting: Family Medicine

## 2014-12-17 ENCOUNTER — Ambulatory Visit: Payer: Self-pay | Admitting: Family Medicine

## 2015-01-10 ENCOUNTER — Ambulatory Visit: Payer: Self-pay | Admitting: Family Medicine

## 2015-02-11 ENCOUNTER — Ambulatory Visit (INDEPENDENT_AMBULATORY_CARE_PROVIDER_SITE_OTHER): Payer: Self-pay | Admitting: Family Medicine

## 2015-02-11 ENCOUNTER — Encounter: Payer: Self-pay | Admitting: Family Medicine

## 2015-02-11 VITALS — BP 112/75 | HR 78 | Temp 98.7°F | Ht 59.0 in | Wt 191.0 lb

## 2015-02-11 DIAGNOSIS — M25512 Pain in left shoulder: Secondary | ICD-10-CM

## 2015-02-11 DIAGNOSIS — IMO0002 Reserved for concepts with insufficient information to code with codable children: Secondary | ICD-10-CM

## 2015-02-11 DIAGNOSIS — G8929 Other chronic pain: Secondary | ICD-10-CM

## 2015-02-11 DIAGNOSIS — E1165 Type 2 diabetes mellitus with hyperglycemia: Secondary | ICD-10-CM

## 2015-02-11 DIAGNOSIS — Z23 Encounter for immunization: Secondary | ICD-10-CM

## 2015-02-11 DIAGNOSIS — M549 Dorsalgia, unspecified: Secondary | ICD-10-CM

## 2015-02-11 LAB — POCT GLYCOSYLATED HEMOGLOBIN (HGB A1C): Hemoglobin A1C: 11.3

## 2015-02-11 MED ORDER — NAPROXEN 500 MG PO TABS: 500.0000 mg | ORAL_TABLET | Freq: Two times a day (BID) | ORAL | Status: DC

## 2015-02-11 MED ORDER — CYCLOBENZAPRINE HCL 10 MG PO TABS: 10.0000 mg | ORAL_TABLET | Freq: Three times a day (TID) | ORAL | Status: DC | PRN

## 2015-02-11 NOTE — Patient Instructions (Signed)
Dear Henreitta Leber, Thank you for coming in to clinic today. It was good to see you!  1. For your Diabetes - blood sugar is gradually worsening since February. Talked about importance of healthy DM Diet, reduce carbs, portion size, increase vegetables, reduce sugars (especially sweet tea, sodas). - Keep up good work exercising with walking, at least 15 to 30 min almost every day - Drink plenty of water to stay hydrated 2. For Left arm / shoulder pain - I think you have some arthritis in your shoulder, and this may be a flare-up called Bursitis. - Take Naproxen 500mg  with food twice daily every day for next 2 weeks, then may continue for up to 1-2 more weeks if helping as needed. NO IBUPROFEN / ADVIL or BC powder while on Naproxen - Take Tylenol as advised 3. For chronic back pain - Try Flexeril as prescribed may take half to whole tablet every 8 hours as needed  Some important numbers from today's visit: HgA1C - 10.5 to 11.3 - BP 112/75 mmHg  Pulse 78  Temp(Src) 98.7 F (37.1 C) (Oral)  Ht 4\' 11"  (1.499 m)  Wt 191 lb (86.637 kg)  BMI 38.56 kg/m2  Due for blood work (BMET and Lipids) in 06/2015  Please schedule a follow-up appointment with Dr. Ardelia Mems in 3 months for Diabetes, sooner if needed  If you have any other questions or concerns, please feel free to call the clinic to contact me. You may also schedule an earlier appointment if necessary.  However, if your symptoms get significantly worse, please go to the Emergency Department to seek immediate medical attention.  Nobie Putnam, [place]

## 2015-02-11 NOTE — Progress Notes (Signed)
   Subjective:    Patient ID: Kristy [name], female    DOB: Jun 03, 1966, 49 y.o.   MRN: [id number]  HPI  CHRONIC DM, Type 2: Reports - thinks that she has been eating more poorly recently, admits B-day is 4th July, feels like she is "itching" and this is usually when sugars are elevated. Previously improved but now thinks significant worsening since July. Other fam member b-days May to July. CBGs: Avg 200s, Low occasional < 150s (symptoms), High "Hi". Checks CBGs - 1x daily (not every day 2-3 hrs after eating breakfast), not checking fasting sugars Meds: Lantus 50u daily, Metformin-XR 1 tab BID (was taking 1000mg , GI intolerance) Reports good compliance. Tolerating well w/o side-effects Currently on ACEi Lifestyle: Diet (poor dietary choices) / Exercise (walking every other day, 15-30 min) - Last ophthalmology, request referral Denies hypoglycemia, polyuria, visual changes, numbness or tingling.  LEFT ARM PAIN / CHRONIC LBP: - Known history of Lumbar spine mild DJD, last x-ray 2012 - Reports history of fall 3 years ago while going down some steps. Fell on left arm. Went to ED initially had L shoulder X-rays. Gradual worsening over past few years, intermittent episodes. Worst with laying or sleeping on Left side or holding heavy objects, activity and sometimes flares with overhead activities. Able to perform daily activities without difficulty. - Additionally Chronic LBP stable without worsening, continues to have pain in low back, some days better than others. No change. No radiating pain. - Takes BC powder x 2 several days a week with good relief. Tylenol 1-2 tabs up to 2-4 tabs in day on sometimes, with good relief. - Not using heating pad, does get relief in shower - Denies any numbness, weakness, tingling, shooting pains, other joint pain or swelling, redness, rash, urinary incontinence or retention, bowel incontinence, saddle anesthesia  I have reviewed and updated the following as  appropriate: allergies and current medications  Social Hx: - Former smoker (few years ago, only smoked 1-2 cigs daily)  Review of Systems  See above HPI    Objective:   Physical Exam  BP 112/75 mmHg  Pulse 78  Temp(Src) 98.7 F (37.1 C) (Oral)  Ht 4\' 11"  (1.499 m)  Wt 191 lb (86.637 kg)  BMI 38.56 kg/m2  Gen - obese, 27 yr AAF, currently well-appearing, comfortable, NAD HEENT - MMM Neck - supple, non-tender, no LAD, no thyromegaly Heart - RRR, no murmurs heard Lungs - CTAB, no wheezing, crackles, or rhonchi. Non-labored. MSK: - Back: without deformities, appropriate active ROM, non-tender over spinous processes, significant b/l +lumbar paraspinal hypertonicity and spasm with mild +TTP lower R>L. Seated SLR negative for radiculopathy. - Left Shoulder: No obvious deformity compared to Right. Mostly normal active ROM with slight limited full forward flexion above head due to mild pain, otherwise abduction above shoulder. Internal rotation normal. Mild +TTP anterior shoulder without pain posteriorly. Rotator cuff supraspinatus str intact 5/5 (full and empty can testing with mild pain only), hawkin's impingement testing mild positive pain.  Ext - non-tender, no edema, peripheral pulses intact +2 b/l Skin - warm, dry, no rashes Neuro - awake, alert, grossly non-focal, intact muscle strength 5/5 b/l grip, biceps flex, knee flex, ankle dorsiflex, intact distal sensation to light touch, gait normal  DM FOOT EXAM - normal appearance, no lesions, calluses, or ulcers, intact sensation to monofilament bilaterally. Skin dry generally.     Assessment & Plan:   See specific A&P problem list for details.

## 2015-02-11 NOTE — Assessment & Plan Note (Addendum)
Stable chronic back pain without injury or worsening, h/o Lumbar DJD. No radicular symptoms or red flags. - Last imaging X-rays 2012 with mild spondylosis - [facility]  Plan: 1. Repeat trial on Flexeril 10mg  - take 5-10mg  q 8 hr PRN back pain, given refills, previously improved pain 2. Rx Naproxen 500mg  BID WC for 2 weeks scheduled then PRN x 2 weeks (#60, 0 refills) - Advised to stop NSAIDs, BC powder, while on this, may take Tylenol though. 3. Start conservative therapy with heating pad / moist heat regularly, stay active 4. RTC 1-3 months for follow-up

## 2015-02-11 NOTE — Assessment & Plan Note (Signed)
Stable intermittent flares of chronic Left shoulder pain, worse with sleeping/laying on it, overhead activity and heavy lifting. Suspect bursitis flare with likely known DJD and prior fall injury 3 years ago without fracture on imaging. Consider rotator cuff unlikely tear or serious injury given intact strength and mostly intact ROM.  Plan: 1. Trial on Naproxen 500mg  BID WC x 2 weeks scheduled, then PRN x 2 weeks - no other NSAIDs 2. ROM exercises, may take Tylenol, heating pad / moist heat 3. RTC 1-3 months for follow-up

## 2015-02-11 NOTE — Assessment & Plan Note (Signed)
Worsening already poor control. Seems to be compliant with insulin / medicines, but very poor dietary decisions, inc sweets and portion sizes. Note she is symptomatic with CBGs < 150. Current HgbA1c 11.3 (7/18), last HgbA1c 10.5 (08/2014). No complications or hypoglycemia. - DM Foot exam normal today to monofilament (just dry skin)  Plan:  1. Increase Lantus insulin from 50 to 55u daily, instructions to titrate up by 2 units every 3-5 days if consistent CBGs >250, goal < 200s. Instructed to start checking CBGs more regularly, including daily fasting. 2. Continue Metformin-XR 500mg  BID (unable to tolerate 1000mg  BID) 3. Lifestyle Mods - Strong emphasis on Improved DM diet (dec carbs, inc vegs), improve exercise with walking regularly 15-89min most days, goal at least 5-6x weekly 4. Referral to Ophthalmology placed for annual DM exam 5. PNA vaccine today 6. Due for BMET & Direct LDL 06/2015 7. RTC 3 mo for DM f/u

## 2015-03-05 ENCOUNTER — Other Ambulatory Visit: Payer: Self-pay | Admitting: *Deleted

## 2015-03-06 MED ORDER — INSULIN GLARGINE 100 UNITS/ML SOLOSTAR PEN: 55.0000 [IU] | PEN_INJECTOR | Freq: Every day | SUBCUTANEOUS | Status: DC

## 2015-03-07 ENCOUNTER — Telehealth: Payer: Self-pay | Admitting: *Deleted

## 2015-03-07 NOTE — Telephone Encounter (Signed)
Dawn calls needing clarification on pts Lantus.   Her last Rx was for 43 units, but the new Rx is for 55 units.  Last documentation found in chart is as follows:  Plan:  1. Increase Lantus insulin from 50 to 55u daily, instructions to titrate up by 2 units every 3-5 days if consistent CBGs >250, goal < 200s. Instructed to start checking CBGs more regularly, including daily fasting.  LMOVM of MAP program informing of this. Fle[place], [name]

## 2015-06-28 ENCOUNTER — Other Ambulatory Visit: Payer: Self-pay | Admitting: *Deleted

## 2015-07-04 ENCOUNTER — Encounter (HOSPITAL_COMMUNITY): Payer: Self-pay

## 2015-07-04 ENCOUNTER — Emergency Department (HOSPITAL_COMMUNITY)
Admission: EM | Admit: 2015-07-04 | Discharge: 2015-07-04 | Disposition: A | Discharge status: Home / Self Care | Payer: Self-pay | Attending: Emergency Medicine | Admitting: Emergency Medicine

## 2015-07-04 DIAGNOSIS — Z79899 Other long term (current) drug therapy: Secondary | ICD-10-CM | POA: Insufficient documentation

## 2015-07-04 DIAGNOSIS — E119 Type 2 diabetes mellitus without complications: Secondary | ICD-10-CM | POA: Insufficient documentation

## 2015-07-04 DIAGNOSIS — Z7984 Long term (current) use of oral hypoglycemic drugs: Secondary | ICD-10-CM | POA: Insufficient documentation

## 2015-07-04 DIAGNOSIS — Z87891 Personal history of nicotine dependence: Secondary | ICD-10-CM | POA: Insufficient documentation

## 2015-07-04 DIAGNOSIS — N611 Abscess of the breast and nipple: Secondary | ICD-10-CM | POA: Insufficient documentation

## 2015-07-04 DIAGNOSIS — Z794 Long term (current) use of insulin: Secondary | ICD-10-CM | POA: Insufficient documentation

## 2015-07-04 DIAGNOSIS — E785 Hyperlipidemia, unspecified: Secondary | ICD-10-CM | POA: Insufficient documentation

## 2015-07-04 DIAGNOSIS — G8929 Other chronic pain: Secondary | ICD-10-CM | POA: Insufficient documentation

## 2015-07-04 DIAGNOSIS — Z7982 Long term (current) use of aspirin: Secondary | ICD-10-CM | POA: Insufficient documentation

## 2015-07-04 DIAGNOSIS — L0291 Cutaneous abscess, unspecified: Secondary | ICD-10-CM

## 2015-07-04 DIAGNOSIS — Z87442 Personal history of urinary calculi: Secondary | ICD-10-CM | POA: Insufficient documentation

## 2015-07-04 DIAGNOSIS — Z791 Long term (current) use of non-steroidal anti-inflammatories (NSAID): Secondary | ICD-10-CM | POA: Insufficient documentation

## 2015-07-04 DIAGNOSIS — Z862 Personal history of diseases of the blood and blood-forming organs and certain disorders involving the immune mechanism: Secondary | ICD-10-CM | POA: Insufficient documentation

## 2015-07-04 LAB — CBG MONITORING, ED: Glucose-Capillary: 295 mg/dL — ABNORMAL HIGH (ref 65–99)

## 2015-07-04 MED ORDER — SULFAMETHOXAZOLE-TRIMETHOPRIM 400-80 MG PO TABS: 1.0000 | ORAL_TABLET | Freq: Two times a day (BID) | ORAL | Status: DC

## 2015-07-04 MED ORDER — IBUPROFEN 200 MG PO TABS
600.0000 mg | ORAL_TABLET | Freq: Once | ORAL | Status: AC
  Administered 2015-07-04: 600 mg via ORAL
  Filled 2015-07-04: qty 3

## 2015-07-04 MED ORDER — LIDOCAINE HCL (PF) 1 % IJ SOLN
2.0000 mL | Freq: Once | INTRAMUSCULAR | Status: AC
  Administered 2015-07-04: 2 mL
  Filled 2015-07-04: qty 5

## 2015-07-04 MED ORDER — IBUPROFEN 600 MG PO TABS: 600.0000 mg | ORAL_TABLET | Freq: Three times a day (TID) | ORAL | Status: DC

## 2015-07-04 MED ORDER — SULFAMETHOXAZOLE-TRIMETHOPRIM 400-80 MG PO TABS
1.0000 | ORAL_TABLET | Freq: Two times a day (BID) | ORAL | Status: DC
  Administered 2015-07-04: 1 via ORAL
  Filled 2015-07-04 (×3): qty 1

## 2015-07-04 MED ORDER — INSULIN GLARGINE 100 UNITS/ML SOLOSTAR PEN: 55.0000 [IU] | PEN_INJECTOR | Freq: Every day | SUBCUTANEOUS | Status: DC

## 2015-07-04 NOTE — ED Provider Notes (Addendum)
CSN: [id number]     Arrival date & time 07/04/15  1939 History  By signing my name below, I, [facility], attest that this documentation has been prepared under the direction and in the presence of Junius Creamer, NP. Electronically Signed: Virgel Bouquet, ED Scribe. 07/04/2015. 10:27 PM.    Chief Complaint  Patient presents with  . Recurrent Skin Infections   The history is provided by the patient.   HPI Comments: Kristy [name] is a 49 y.o. female who presents to the Emergency Department complaining of a painful, lump on the right breast that worsened this morning. Patient reports that her right breast felt sore yesterday and noticed a bump resembling a whitehead which she popped. She notes that there was no discharge and that the bump became hard, red and painful today. NKDA.   Past Medical History  Diagnosis Date  . Diabetes mellitus type II   . Hyperlipidemia   . Chronic back pain   . Anemia   . Kidney stones    Past Surgical History  Procedure Laterality Date  . Cesarean section    . Tubal ligation    . Kidney stone surgery     Family History  Problem Relation Age of Onset  . Adopted: Yes   Social History  Substance Use Topics  . Smoking status: Former Smoker -- 0.25 packs/day    Types: Cigarettes  . Smokeless tobacco: None     Comment: one cigarette daily  . Alcohol Use: No   OB History    Gravida Para Term Preterm AB TAB SAB Ectopic Multiple Living   2 2 2       2      Review of Systems  Constitutional: Negative for fever.  Skin: Positive for color change and wound.  All other systems reviewed and are negative.     Allergies  Fish allergy and Oxycodone  Home Medications   Prior to Admission medications   Medication Sig Start Date End Date Taking? Authorizing Provider  acetaminophen (TYLENOL) 500 MG tablet Take 1,000 mg by mouth every 6 (six) hours as needed for pain.    Historical Provider, MD  aspirin 81 MG tablet Take 81 mg by mouth daily.       Historical Provider, MD  chlorpheniramine-HYDROcodone (TUSSIONEX PENNKINETIC ER) 10-8 MG/5ML LQCR Take 5 mLs by mouth every 12 (twelve) hours as needed for cough (or pain). Patient not taking: Reported on 02/11/2015 07/11/14   Clayton Bibles, PA-C  cyclobenzaprine (FLEXERIL) 10 MG tablet Take 1 tablet (10 mg total) by mouth 3 (three) times daily as needed for muscle spasms. 02/11/15   Olin Hauser, DO  ibuprofen (ADVIL,MOTRIN) 600 MG tablet Take 1 tablet (600 mg total) by mouth 3 (three) times daily. 07/04/15   Junius Creamer, NP  insulin glargine (LANTUS) 100 unit/mL SOPN Inject 0.55 mLs (55 Units total) into the skin at bedtime. 07/04/15   Leeanne Rio, MD  Insulin Pen Needle (BD ULTRA-FINE PEN NEEDLES) 29G X 12.7MM MISC Check blood sugar 4x daily 03/28/14   Leeanne Rio, MD  lisinopril (PRINIVIL,ZESTRIL) 5 MG tablet Take 1 tablet (5 mg total) by mouth daily. 09/13/14   Leeanne Rio, MD  metFORMIN (GLUCOPHAGE-XR) 500 MG 24 hr tablet Take 1 tablet (500 mg total) by mouth 2 (two) times daily. 09/14/14   Leeanne Rio, MD  naproxen (NAPROSYN) 500 MG tablet Take 1 tablet (500 mg total) by mouth 2 (two) times daily with a meal. 02/11/15   Sheppard Coil  J Karamalegos, DO  rosuvastatin (CRESTOR) 10 MG tablet Take 1 tablet (10 mg total) by mouth daily. 09/13/14   Leeanne Rio, MD  sulfamethoxazole-trimethoprim (BACTRIM,SEPTRA) 400-80 MG tablet Take 1 tablet by mouth 2 (two) times daily. 07/04/15   Junius Creamer, NP  traMADol (ULTRAM) 50 MG tablet Take 1 tablet (50 mg total) by mouth every 8 (eight) hours as needed. Patient not taking: Reported on 02/11/2015 06/14/14   Leeanne Rio, MD   BP 117/69 mmHg  Pulse 95  Temp(Src) 98.3 F (36.8 C) (Oral)  Resp 18  Ht 4\' 11"  (1.499 m)  Wt 86.183 kg  BMI 38.35 kg/m2  SpO2 98%  LMP 06/27/2015 Physical Exam  Constitutional: She appears well-nourished.  Eyes: Pupils are equal, round, and reactive to light.  Cardiovascular:  Normal rate and regular rhythm.   Pulmonary/Chest: Effort normal.    Abdominal: Soft.  Skin: Skin is warm. There is erythema.  Nursing note and vitals reviewed.   ED Course  .Marland KitchenIncision and Drainage Date/Time: 07/04/2015 10:23 PM Performed by: Junius Creamer Authorized by: Junius Creamer Consent: Verbal consent obtained. Written consent not obtained. Risks and benefits: risks, benefits and alternatives were discussed Consent given by: patient Patient understanding: patient states understanding of the procedure being performed Patient identity confirmed: verbally with patient Type: abscess Body area: trunk Location details: right breast Anesthesia: local infiltration Local anesthetic: lidocaine 1% without epinephrine Anesthetic total: 2 ml Patient sedated: no Scalpel size: 11 Needle gauge: 22 Incision type: single straight Incision depth: dermal Complexity: simple Drainage: purulent Drainage amount: moderate Wound treatment: wound left open Patient tolerance: Patient tolerated the procedure well with no immediate complications    DIAGNOSTIC STUDIES: Oxygen Saturation is 98% on RA, normal by my interpretation.    COORDINATION OF CARE: 9:50 PM Will order antibiotics and ibuprofen. Discussed treatment plan with pt at bedside and pt agreed to plan. 10:17 PM Numbed area with lidocaine 1%. I&Ded the area. Advised patient to apply hot compresses to area.   Labs Review Labs Reviewed  CBG MONITORING, ED - Abnormal; Notable for the following:    Glucose-Capillary 295 (*)    All other components within normal limits    Imaging Review No results found. I have personally reviewed and evaluated these images and lab results as part of my medical decision-making.   EKG Interpretation None     Will start Bactrium MDM   Final diagnoses:  Abscess    I personally performed the services described in this documentation, which was scribed in my presence. The recorded information  has been reviewed and is accurate.   Junius Creamer, NP 07/04/15 [place], DO 07/04/15 [id number]  Junius Creamer, NP 07/04/15 Ord, DO 07/04/15 2236

## 2015-07-04 NOTE — ED Notes (Signed)
Pt has an abcess on her right breast since yesterday

## 2015-07-04 NOTE — Discharge Instructions (Signed)
Incision and Drainage Incision and drainage is a procedure in which a sac-like structure (cystic structure) is opened and drained. The area to be drained usually contains material such as pus, fluid, or blood.  LET YOUR CAREGIVER KNOW ABOUT:   Allergies to medicine.  Medicines taken, including vitamins, herbs, eyedrops, over-the-counter medicines, and creams.  Use of steroids (by mouth or creams).  Previous problems with anesthetics or numbing medicines.  History of bleeding problems or blood clots.  Previous surgery.  Other health problems, including diabetes and kidney problems.  Possibility of pregnancy, if this applies. RISKS AND COMPLICATIONS  Pain.  Bleeding.  Scarring.  Infection. BEFORE THE PROCEDURE  You may need to have an ultrasound or other imaging tests to see how large or deep your cystic structure is. Blood tests may also be used to determine if you have an infection or how severe the infection is. You may need to have a tetanus shot. PROCEDURE  The affected area is cleaned with a cleaning fluid. The cyst area will then be numbed with a medicine (local anesthetic). A small incision will be made in the cystic structure. A syringe or catheter may be used to drain the contents of the cystic structure, or the contents may be squeezed out. The area will then be flushed with a cleansing solution. After cleansing the area, it is often gently packed with a gauze or another wound dressing. Once it is packed, it will be covered with gauze and tape or some other type of wound dressing. AFTER THE PROCEDURE   Often, you will be allowed to go home right after the procedure.  You may be given antibiotic medicine to prevent or heal an infection.  If the area was packed with gauze or some other wound dressing, you will likely need to come back in 1 to 2 days to get it removed.  The area should heal in about 14 days.   This information is not intended to replace advice given  to you by your health care provider. Make sure you discuss any questions you have with your health care provider.   Document Released: 01/06/2001 Document Revised: 01/12/2012 Document Reviewed: 09/07/2011 Elsevier Interactive Patient Education 2016 Elsevier Inc.  

## 2015-07-04 NOTE — ED Notes (Signed)
Pt states "I noticed on Tuesday my right breast was a little sore.  :Yesterday I noticed a little white pimple and I said I can take care of that.  I woke up today and it was worse."

## 2015-10-09 ENCOUNTER — Other Ambulatory Visit: Payer: Self-pay | Admitting: *Deleted

## 2015-10-09 MED ORDER — METFORMIN HCL ER 500 MG PO TB24: 500.0000 mg | ORAL_TABLET | Freq: Two times a day (BID) | ORAL | Status: DC

## 2015-12-02 ENCOUNTER — Other Ambulatory Visit: Payer: Self-pay | Admitting: *Deleted

## 2015-12-02 MED ORDER — INSULIN GLARGINE 100 UNITS/ML SOLOSTAR PEN: 55.0000 [IU] | PEN_INJECTOR | Freq: Every day | SUBCUTANEOUS | Status: DC

## 2015-12-02 NOTE — Telephone Encounter (Signed)
Not seen by pcp for 9 months - refilled x 1 Wrote to pharmacy no furhter refills till seen

## 2015-12-20 ENCOUNTER — Inpatient Hospital Stay (HOSPITAL_COMMUNITY)
Admission: AD | Admit: 2015-12-20 | Discharge: 2015-12-21 | Disposition: A | Discharge status: Home / Self Care | Payer: Self-pay | Source: Ambulatory Visit | Attending: Family Medicine | Admitting: Family Medicine

## 2015-12-20 DIAGNOSIS — Z87891 Personal history of nicotine dependence: Secondary | ICD-10-CM | POA: Insufficient documentation

## 2015-12-20 DIAGNOSIS — Z7984 Long term (current) use of oral hypoglycemic drugs: Secondary | ICD-10-CM | POA: Insufficient documentation

## 2015-12-20 DIAGNOSIS — Z7982 Long term (current) use of aspirin: Secondary | ICD-10-CM | POA: Insufficient documentation

## 2015-12-20 DIAGNOSIS — Z3202 Encounter for pregnancy test, result negative: Secondary | ICD-10-CM | POA: Insufficient documentation

## 2015-12-20 DIAGNOSIS — Z794 Long term (current) use of insulin: Secondary | ICD-10-CM | POA: Insufficient documentation

## 2015-12-20 DIAGNOSIS — N939 Abnormal uterine and vaginal bleeding, unspecified: Secondary | ICD-10-CM | POA: Insufficient documentation

## 2015-12-20 DIAGNOSIS — E119 Type 2 diabetes mellitus without complications: Secondary | ICD-10-CM | POA: Insufficient documentation

## 2015-12-21 ENCOUNTER — Encounter (HOSPITAL_COMMUNITY): Payer: Self-pay | Admitting: *Deleted

## 2015-12-21 DIAGNOSIS — N939 Abnormal uterine and vaginal bleeding, unspecified: Secondary | ICD-10-CM

## 2015-12-21 LAB — CBC
HCT: 32.2 % — ABNORMAL LOW (ref 36.0–46.0)
Hemoglobin: 11.1 g/dL — ABNORMAL LOW (ref 12.0–15.0)
MCH: 23.2 pg — ABNORMAL LOW (ref 26.0–34.0)
MCHC: 34.5 g/dL (ref 30.0–36.0)
MCV: 67.4 fL — ABNORMAL LOW (ref 78.0–100.0)
Platelets: 315 10*3/uL (ref 150–400)
RBC: 4.78 MIL/uL (ref 3.87–5.11)
RDW: 16.4 % — ABNORMAL HIGH (ref 11.5–15.5)
WBC: 6.7 10*3/uL (ref 4.0–10.5)

## 2015-12-21 LAB — URINALYSIS, ROUTINE W REFLEX MICROSCOPIC
Bilirubin Urine: NEGATIVE
Glucose, UA: >1000 mg/dL — AB
Ketones, ur: NEGATIVE mg/dL
Leukocytes, UA: NEGATIVE
Nitrite: NEGATIVE
Protein, ur: NEGATIVE mg/dL
Specific Gravity, Urine: <1.005 — ABNORMAL LOW (ref 1.005–1.030)
pH: 5.5 (ref 5.0–8.0)

## 2015-12-21 LAB — URINE MICROSCOPIC-ADD ON

## 2015-12-21 LAB — WET PREP, GENITAL
CLUE CELLS WET PREP: NONE SEEN
SPERM: NONE SEEN
Trich, Wet Prep: NONE SEEN
Yeast Wet Prep HPF POC: NONE SEEN

## 2015-12-21 LAB — POCT PREGNANCY, URINE: PREG TEST UR: NEGATIVE

## 2015-12-21 LAB — HIV ANTIBODY (ROUTINE TESTING W REFLEX): HIV Screen 4th Generation wRfx: NONREACTIVE

## 2015-12-21 MED ORDER — MEGESTROL ACETATE 40 MG PO TABS
120.0000 mg | ORAL_TABLET | Freq: Once | ORAL | Status: AC
  Administered 2015-12-21: 120 mg via ORAL
  Filled 2015-12-21: qty 3

## 2015-12-21 MED ORDER — MEGESTROL ACETATE 20 MG PO TABS: ORAL_TABLET | ORAL | Status: DC

## 2015-12-21 NOTE — Discharge Instructions (Signed)

## 2015-12-21 NOTE — Progress Notes (Signed)
Written and verbal d/c instructions given and understanding voiced. 

## 2015-12-21 NOTE — MAU Note (Signed)
Been on my period for few days. Early this am had sharp pains in lower abd and took to Tylenol. Had 2 episodes of bleeding today where had big clots. Back and legs hurt too.

## 2015-12-21 NOTE — MAU Provider Note (Signed)
Chief Complaint: Abdominal Pain and Vaginal Bleeding   First Provider Initiated Contact with Patient 12/21/15 0245      SUBJECTIVE HPI: Kristy [name] is a 50 y.o. G2P2002 who presents to maternity admissions reporting onset of menses 3 days ago.  Today, bleeding became heavy with large clots, soaking through to her clothes x 2.  She has not taken anything for her bleeding. She denies dizziness, h/a, palpitations. She denies vaginal itching/burning, urinary symptoms, h/a, dizziness, n/v, or fever/chills.     HPI  Past Medical History  Diagnosis Date  . Diabetes mellitus type II   . Hyperlipidemia   . Chronic back pain   . Anemia   . Kidney stones    Past Surgical History  Procedure Laterality Date  . Cesarean section    . Tubal ligation    . Kidney stone surgery     Social History   Social History  . Marital Status: Single    Spouse Name: N/A  . Number of Children: N/A  . Years of Education: N/A   Occupational History  . Not on file.   Social History Main Topics  . Smoking status: Former Smoker -- 0.25 packs/day    Types: Cigarettes  . Smokeless tobacco: Not on file     Comment: one cigarette daily  . Alcohol Use: No  . Drug Use: No  . Sexual Activity: Yes    Birth Control/ Protection: Surgical   Other Topics Concern  . Not on file   Social History Narrative   Lives with son [name], in his 3s.   No current facility-administered medications on file prior to encounter.   Current Outpatient Prescriptions on File Prior to Encounter  Medication Sig Dispense Refill  . insulin glargine (LANTUS) 100 unit/mL SOPN Inject 0.55 mLs (55 Units total) into the skin at bedtime. 15 mL 0  . metFORMIN (GLUCOPHAGE-XR) 500 MG 24 hr tablet Take 1 tablet (500 mg total) by mouth 2 (two) times daily. 60 tablet 0  . acetaminophen (TYLENOL) 500 MG tablet Take 1,000 mg by mouth every 6 (six) hours as needed for pain.    Marland Kitchen aspirin 81 MG tablet Take 81 mg by mouth daily.      .  chlorpheniramine-HYDROcodone (TUSSIONEX PENNKINETIC ER) 10-8 MG/5ML LQCR Take 5 mLs by mouth every 12 (twelve) hours as needed for cough (or pain). (Patient not taking: Reported on 02/11/2015) 100 mL 0  . cyclobenzaprine (FLEXERIL) 10 MG tablet Take 1 tablet (10 mg total) by mouth 3 (three) times daily as needed for muscle spasms. 30 tablet 2  . ibuprofen (ADVIL,MOTRIN) 600 MG tablet Take 1 tablet (600 mg total) by mouth 3 (three) times daily. 30 tablet 0  . Insulin Pen Needle (BD ULTRA-FINE PEN NEEDLES) 29G X 12.7MM MISC Check blood sugar 4x daily 100 each 11  . lisinopril (PRINIVIL,ZESTRIL) 5 MG tablet Take 1 tablet (5 mg total) by mouth daily. 30 tablet 3  . naproxen (NAPROSYN) 500 MG tablet Take 1 tablet (500 mg total) by mouth 2 (two) times daily with a meal. 60 tablet 0  . rosuvastatin (CRESTOR) 10 MG tablet Take 1 tablet (10 mg total) by mouth daily. 90 tablet 3  . sulfamethoxazole-trimethoprim (BACTRIM,SEPTRA) 400-80 MG tablet Take 1 tablet by mouth 2 (two) times daily. 9 tablet 0  . traMADol (ULTRAM) 50 MG tablet Take 1 tablet (50 mg total) by mouth every 8 (eight) hours as needed. (Patient not taking: Reported on 02/11/2015) 30 tablet 0   Allergies  Allergen Reactions  . Fish Allergy Hives    hives  . Oxycodone Itching    ROS:  Review of Systems  Constitutional: Negative for fever, chills and fatigue.  Respiratory: Negative for shortness of breath.   Cardiovascular: Negative for chest pain.  Gastrointestinal: Negative for nausea and vomiting.  Genitourinary: Positive for vaginal bleeding and pelvic pain. Negative for dysuria, flank pain, vaginal discharge, difficulty urinating and vaginal pain.  Neurological: Negative for dizziness and headaches.  Psychiatric/Behavioral: Negative.      I have reviewed patient's Past Medical Hx, Surgical Hx, Family Hx, Social Hx, medications and allergies.   Physical Exam   Patient Vitals for the past 24 hrs:  BP Temp Pulse Resp SpO2 Height  Weight  12/21/15 0328 125/78 mmHg 98.3 F (36.8 C) 83 18 - - -  12/21/15 0152 135/84 mmHg - 79 - - - -  12/21/15 0018 - - - - 100 % - -  12/21/15 0016 147/92 mmHg 98.2 F (36.8 C) 79 18 - 4\' 11"  (1.499 m) 187 lb 3.2 oz (84.913 kg)   Constitutional: Well-developed, well-nourished female in no acute distress.  Cardiovascular: normal rate Respiratory: normal effort GI: Abd soft, non-tender. Pos BS x 4 MS: Extremities nontender, no edema, normal ROM Neurologic: Alert and oriented x 4.  GU: Neg CVAT.  PELVIC EXAM: Cervix pink, visually closed, without lesion, large amount dark red bleeding with clots, vaginal walls and external genitalia normal Bimanual exam: Cervix 0/long/high, firm, anterior, neg CMT, uterus nontender, nonenlarged, adnexa without tenderness, enlargement, or mass   LAB RESULTS Results for orders placed or performed during the hospital encounter of 12/20/15 (from the past 24 hour(s))  Urinalysis, Routine w reflex microscopic (not at [facility])     Status: Abnormal   Collection Time: 12/21/15 12:25 AM  Result Value Ref Range   Color, Urine YELLOW YELLOW   APPearance HAZY (A) CLEAR   Specific Gravity, Urine <1.005 (L) 1.005 - 1.030   pH 5.5 5.0 - 8.0   Glucose, UA >1000 (A) NEGATIVE mg/dL   Hgb urine dipstick LARGE (A) NEGATIVE   Bilirubin Urine NEGATIVE NEGATIVE   Ketones, ur NEGATIVE NEGATIVE mg/dL   Protein, ur NEGATIVE NEGATIVE mg/dL   Nitrite NEGATIVE NEGATIVE   Leukocytes, UA NEGATIVE NEGATIVE  Urine microscopic-add on     Status: Abnormal   Collection Time: 12/21/15 12:25 AM  Result Value Ref Range   Squamous Epithelial / LPF 0-5 (A) NONE SEEN   WBC, UA 0-5 0 - 5 WBC/hpf   RBC / HPF TOO NUMEROUS TO COUNT 0 - 5 RBC/hpf   Bacteria, UA RARE (A) NONE SEEN  Pregnancy, urine POC     Status: None   Collection Time: 12/21/15 12:53 AM  Result Value Ref Range   Preg Test, Ur NEGATIVE NEGATIVE  CBC     Status: Abnormal   Collection Time: 12/21/15  1:04 AM  Result  Value Ref Range   WBC 6.7 4.0 - 10.5 K/uL   RBC 4.78 3.87 - 5.11 MIL/uL   Hemoglobin 11.1 (L) 12.0 - 15.0 g/dL   HCT 32.2 (L) 36.0 - 46.0 %   MCV 67.4 (L) 78.0 - 100.0 fL   MCH 23.2 (L) 26.0 - 34.0 pg   MCHC 34.5 30.0 - 36.0 g/dL   RDW 16.4 (H) 11.5 - 15.5 %   Platelets 315 150 - 400 K/uL  Wet prep, genital     Status: Abnormal   Collection Time: 12/21/15  2:33 AM  Result Value  Ref Range   Yeast Wet Prep HPF POC NONE SEEN NONE SEEN   Trich, Wet Prep NONE SEEN NONE SEEN   Clue Cells Wet Prep HPF POC NONE SEEN NONE SEEN   WBC, Wet Prep HPF POC FEW (A) NONE SEEN   Sperm NONE SEEN        IMAGING No results found.  MAU Management/MDM: Ordered labs and reviewed results.  Hgb stable with no s/sx of anemia.  Pt bleeding in MAU significant so Megace dose of 120 mg given x 1 in MAU.  Rx for Megace taper to start tomorrow. Message sent to [facility] for pt to establish f/u care, for endometrial biopsy, etc.  Pt stable at time of discharge.  ASSESSMENT 1. Abnormal uterine bleeding (AUB)     PLAN Discharge home with bleeding precautions    Medication List    STOP taking these medications        chlorpheniramine-HYDROcodone 10-8 MG/5ML Lqcr  Commonly known as:  TUSSIONEX PENNKINETIC ER     traMADol 50 MG tablet  Commonly known as:  ULTRAM      TAKE these medications        acetaminophen 500 MG tablet  Commonly known as:  TYLENOL  Take 1,000 mg by mouth every 6 (six) hours as needed for pain.     aspirin 81 MG tablet  Take 81 mg by mouth daily.     cyclobenzaprine 10 MG tablet  Commonly known as:  FLEXERIL  Take 1 tablet (10 mg total) by mouth 3 (three) times daily as needed for muscle spasms.     ibuprofen 600 MG tablet  Commonly known as:  ADVIL,MOTRIN  Take 1 tablet (600 mg total) by mouth 3 (three) times daily.     insulin glargine 100 unit/mL Sopn  Commonly known as:  LANTUS  Inject 0.55 mLs (55 Units total) into the skin at bedtime.     Insulin Pen Needle 29G X  12.7MM Misc  Commonly known as:  BD ULTRA-FINE PEN NEEDLES  Check blood sugar 4x daily     lisinopril 5 MG tablet  Commonly known as:  PRINIVIL,ZESTRIL  Take 1 tablet (5 mg total) by mouth daily.     megestrol 20 MG tablet  Commonly known as:  MEGACE  Take 6 tablets daily for 3 days, 4 tablets daily for 3 days, then 2 tablets daily     metFORMIN 500 MG 24 hr tablet  Commonly known as:  GLUCOPHAGE-XR  Take 1 tablet (500 mg total) by mouth 2 (two) times daily.     naproxen 500 MG tablet  Commonly known as:  NAPROSYN  Take 1 tablet (500 mg total) by mouth 2 (two) times daily with a meal.     rosuvastatin 10 MG tablet  Commonly known as:  CRESTOR  Take 1 tablet (10 mg total) by mouth daily.     sulfamethoxazole-trimethoprim 400-80 MG tablet  Commonly known as:  BACTRIM,SEPTRA  Take 1 tablet by mouth 2 (two) times daily.       Follow-up Information    Follow up with [facility].   Specialty:  Obstetrics and Gynecology   Why:  The clinic will call you with appointment., Return to MAU as needed for emergencies   Contact information:   [place] Certified Nurse-Midwife 12/21/2015  3:46 AM

## 2015-12-24 LAB — GC/CHLAMYDIA PROBE AMP (~~LOC~~) NOT AT ARMC
CHLAMYDIA, DNA PROBE: NEGATIVE
Neisseria Gonorrhea: NEGATIVE

## 2015-12-26 ENCOUNTER — Ambulatory Visit (INDEPENDENT_AMBULATORY_CARE_PROVIDER_SITE_OTHER): Payer: Self-pay | Admitting: Family Medicine

## 2015-12-26 ENCOUNTER — Encounter: Payer: Self-pay | Admitting: Family Medicine

## 2015-12-26 VITALS — BP 127/77 | HR 86 | Temp 98.8°F | Ht 59.0 in | Wt 186.6 lb

## 2015-12-26 DIAGNOSIS — N924 Excessive bleeding in the premenopausal period: Secondary | ICD-10-CM

## 2015-12-26 DIAGNOSIS — IMO0001 Reserved for inherently not codable concepts without codable children: Secondary | ICD-10-CM

## 2015-12-26 DIAGNOSIS — N939 Abnormal uterine and vaginal bleeding, unspecified: Secondary | ICD-10-CM

## 2015-12-26 DIAGNOSIS — E1165 Type 2 diabetes mellitus with hyperglycemia: Secondary | ICD-10-CM

## 2015-12-26 LAB — POCT GLYCOSYLATED HEMOGLOBIN (HGB A1C): HEMOGLOBIN A1C: 11.6

## 2015-12-26 MED ORDER — METFORMIN HCL ER 500 MG PO TB24: 500.0000 mg | ORAL_TABLET | Freq: Two times a day (BID) | ORAL | Status: DC

## 2015-12-26 MED ORDER — INSULIN GLARGINE 100 UNITS/ML SOLOSTAR PEN: 30.0000 [IU] | PEN_INJECTOR | Freq: Two times a day (BID) | SUBCUTANEOUS | Status: DC

## 2015-12-26 NOTE — Patient Instructions (Signed)
I sent in refills on your lantus and metformin Take 30 units twice daily  We will call you with appointment for your ultrasound [facility]  Follow up with me in 3-4 weeks  Be well, Dr. Ardelia Mems

## 2015-12-26 NOTE — Progress Notes (Signed)
Date of Visit: 12/26/2015   HPI:  Patient presents to follow up on diabetes. She was told to come in for a visit prior to having any medications refilled since we had not seen her in many months. Last visit was 02/11/15 with Dr Parks Ranger. I have not seen her here since February 2016.  Diabetes - currently taking lantus 55 units at night. In the morning fasting sugars are typically in 300s. Later in the day she gets under 200. Lowest sugar she's gotten is 90. Denies chest pain or shortness of breath. Needs to schedule eye appointment   Vaginal bleeding - was seen in MAU on 5/26 for heavy vaginal bleeding. Was put on megace taper. Is still taking this medication. Bleeding improved with megace. Was told she would be contacted for appointment at [facility] clinic to be seen for the bleeding & endometrial biopsy. Has not scheduled that visit yet. Has not had pelvic ultrasound.  ROS: See HPI.  [place]: type 2 diabetes, obesity, hyperlipidemia, GERD  PHYSICAL EXAM: BP 127/77 mmHg  Pulse 86  Temp(Src) 98.8 F (37.1 C) (Oral)  Ht 4\' 11"  (1.499 m)  Wt 186 lb 9.6 oz (84.641 kg)  BMI 37.67 kg/m2  LMP 12/17/2015 Gen: NAD, pleasant, cooperative HEENT: normocephalic, atraumatic, moist mucous membranes  Heart: regular rate and rhythm, no murmur Lungs: clear to auscultation bilaterally, normal work of breathing  Neuro: alert grossly nonfocal, speech normal Ext: No appreciable lower extremity edema bilaterally  Diabetic foot exam: 2+ DP pulses bilat, normal monofilament testing bilaterally. No lesions or significant calluses.   ASSESSMENT/PLAN:  DM (diabetes mellitus), type 2, uncontrolled Poorly controlled with current A1c of 11.6. Stressed importance of better follow up to adjust insulin for better glycemic control. Will switch lantus to 30 units twice daily (5 units daily total increase, with hopefully better effect by breaking up doses). Follow up with me in a few weeks to see how sugars are  doing.  Abnormal uterine bleeding Encouraged her to call [facility] for an appointment Bleeding stable on megace taper. We will also go ahead and order & schedule pelvic ultrasound to expedite the process of having her AUB evaluated. Patient agreeable to this plan.   FOLLOW UP: Follow up in 3-4 weeks for above issues Schedule appointment in [facility]  [place] J. Ardelia Mems, [place]

## 2015-12-30 DIAGNOSIS — N939 Abnormal uterine and vaginal bleeding, unspecified: Secondary | ICD-10-CM | POA: Insufficient documentation

## 2015-12-30 NOTE — Assessment & Plan Note (Signed)
Encouraged her to call [facility] for an appointment Bleeding stable on megace taper. We will also go ahead and order & schedule pelvic ultrasound to expedite the process of having her AUB evaluated. Patient agreeable to this plan.

## 2015-12-30 NOTE — Assessment & Plan Note (Signed)
Poorly controlled with current A1c of 11.6. Stressed importance of better follow up to adjust insulin for better glycemic control. Will switch lantus to 30 units twice daily (5 units daily total increase, with hopefully better effect by breaking up doses). Follow up with me in a few weeks to see how sugars are doing.

## 2016-01-01 ENCOUNTER — Ambulatory Visit (HOSPITAL_COMMUNITY): Payer: Self-pay | Attending: Family Medicine

## 2016-02-13 ENCOUNTER — Encounter: Payer: Self-pay | Admitting: Obstetrics and Gynecology

## 2016-02-14 NOTE — Progress Notes (Signed)
This encounter was created in error - please disregard.

## 2016-02-14 NOTE — Progress Notes (Signed)
Patient No Show'ed to 02/13/2016 GYN appointment at [place] [facility] for [place] [employer])

## 2016-03-18 ENCOUNTER — Inpatient Hospital Stay (HOSPITAL_COMMUNITY)
Admission: AD | Admit: 2016-03-18 | Discharge: 2016-03-18 | Disposition: A | Discharge status: Home / Self Care | Payer: Self-pay | Source: Ambulatory Visit | Attending: Obstetrics & Gynecology | Admitting: Obstetrics & Gynecology

## 2016-03-18 ENCOUNTER — Encounter (HOSPITAL_COMMUNITY): Payer: Self-pay | Admitting: *Deleted

## 2016-03-18 DIAGNOSIS — N939 Abnormal uterine and vaginal bleeding, unspecified: Secondary | ICD-10-CM | POA: Insufficient documentation

## 2016-03-18 DIAGNOSIS — E785 Hyperlipidemia, unspecified: Secondary | ICD-10-CM | POA: Insufficient documentation

## 2016-03-18 DIAGNOSIS — Z7982 Long term (current) use of aspirin: Secondary | ICD-10-CM | POA: Insufficient documentation

## 2016-03-18 DIAGNOSIS — Z794 Long term (current) use of insulin: Secondary | ICD-10-CM | POA: Insufficient documentation

## 2016-03-18 DIAGNOSIS — F17210 Nicotine dependence, cigarettes, uncomplicated: Secondary | ICD-10-CM | POA: Insufficient documentation

## 2016-03-18 DIAGNOSIS — R109 Unspecified abdominal pain: Secondary | ICD-10-CM | POA: Insufficient documentation

## 2016-03-18 DIAGNOSIS — E119 Type 2 diabetes mellitus without complications: Secondary | ICD-10-CM | POA: Insufficient documentation

## 2016-03-18 DIAGNOSIS — Z7984 Long term (current) use of oral hypoglycemic drugs: Secondary | ICD-10-CM | POA: Insufficient documentation

## 2016-03-18 DIAGNOSIS — Z79899 Other long term (current) drug therapy: Secondary | ICD-10-CM | POA: Insufficient documentation

## 2016-03-18 LAB — URINALYSIS, ROUTINE W REFLEX MICROSCOPIC
BILIRUBIN URINE: NEGATIVE
Glucose, UA: >1000 mg/dL — AB
KETONES UR: NEGATIVE mg/dL
LEUKOCYTES UA: NEGATIVE
NITRITE: NEGATIVE
PH: 7 (ref 5.0–8.0)
PROTEIN: NEGATIVE mg/dL
Specific Gravity, Urine: <1.005 — ABNORMAL LOW (ref 1.005–1.030)

## 2016-03-18 LAB — CBC
HEMATOCRIT: 36.4 % (ref 36.0–46.0)
Hemoglobin: 13.3 g/dL (ref 12.0–15.0)
MCH: 25 pg — AB (ref 26.0–34.0)
MCHC: 36.5 g/dL — ABNORMAL HIGH (ref 30.0–36.0)
MCV: 68.3 fL — AB (ref 78.0–100.0)
Platelets: 315 10*3/uL (ref 150–400)
RBC: 5.33 MIL/uL — ABNORMAL HIGH (ref 3.87–5.11)
RDW: 17.8 % — AB (ref 11.5–15.5)
WBC: 7.8 10*3/uL (ref 4.0–10.5)

## 2016-03-18 LAB — URINE MICROSCOPIC-ADD ON

## 2016-03-18 LAB — POCT PREGNANCY, URINE: Preg Test, Ur: NEGATIVE

## 2016-03-18 NOTE — Discharge Instructions (Signed)

## 2016-03-18 NOTE — MAU Note (Signed)
Was at work. Went to the bathroom,  Started blood, thick like gel. Yesterday had some real bad cramping (was a 20). Started spotting until she got to work.  Periods have been irregular.  Was here a few months ago for the same. Was to have followed up at the clinic, was never called.

## 2016-03-18 NOTE — MAU Provider Note (Signed)
History     CSN: [id number]  Arrival date and time: 03/18/16 [id number]   First Provider Initiated Contact with Patient 03/18/16 2022      Chief Complaint  Patient presents with  . Vaginal Bleeding  . Abdominal Cramping   HPI Pt is a 50yo G2P2 who has a h/o AUB who presents with c/o passage of a 'large clot' while at work today. Pt denies feeling dizziness or SOB.  She reports mild pain in abd that she might normally take Tylenol for.   She was seen prev in the MAU in 11/2015.  She reports only occ spotting since that time. She did not f/u for a visit as 'they never called me' but, reports that she could have missed the call while at work. Pt states that she will f/u for a biopsy and will call tomorrow.   Past Medical History:  Diagnosis Date  . Anemia   . Chronic back pain   . Diabetes mellitus type II   . Hyperlipidemia   . Kidney stones     Past Surgical History:  Procedure Laterality Date  . CESAREAN SECTION    . KIDNEY STONE SURGERY    . TUBAL LIGATION      Family History  Problem Relation Age of Onset  . Adopted: Yes  . Alcohol abuse Neg Hx     Social History  Substance Use Topics  . Smoking status: Light Tobacco Smoker    Packs/day: 0.25    Types: Cigarettes  . Smokeless tobacco: Never Used     Comment: one cigarette daily  . Alcohol use 0.0 oz/week    Allergies:  Allergies  Allergen Reactions  . Fish Allergy Hives    hives  . Oxycodone Itching    Prescriptions Prior to Admission  Medication Sig Dispense Refill Last Dose  . acetaminophen (TYLENOL) 500 MG tablet Take 1,000 mg by mouth every 6 (six) hours as needed for pain.   03/18/2016 at Unknown time  . aspirin 81 MG tablet Take 81 mg by mouth daily. Reported on 12/26/2015   03/18/2016 at Unknown time  . calcium carbonate (TUMS - DOSED IN MG ELEMENTAL CALCIUM) 500 MG chewable tablet Chew 2 tablets by mouth 2 (two) times daily as needed for indigestion or heartburn.   03/18/2016 at Unknown time  . insulin  glargine (LANTUS) 100 unit/mL SOPN Inject 0.3 mLs (30 Units total) into the skin 2 (two) times daily. 15 mL 3 03/17/2016 at Unknown time  . metFORMIN (GLUCOPHAGE-XR) 500 MG 24 hr tablet Take 1 tablet (500 mg total) by mouth 2 (two) times daily. 60 tablet 3 03/17/2016 at Unknown time  . Insulin Pen Needle (BD ULTRA-FINE PEN NEEDLES) 29G X 12.7MM MISC Check blood sugar 4x daily 100 each 11 Taking    ROS Physical Exam   Blood pressure 144/80, pulse 98, temperature 98.2 F (36.8 C), temperature source Oral, resp. rate 18, last menstrual period 03/18/2016.  Physical Exam Pt in NAD Lungs: CTA CV: RRR Abd: obese, NT, ND GU: (digital exam only) EGBUS: no lesions Vagina: very small amount of blood in vault Cervix: no CMT Uterus: enlarged ~ 10-12 weeks sized, mobile Adnexa: no masses; non tender   CBC    Component Value Date/Time   WBC 7.8 03/18/2016 2030   RBC 5.33 (H) 03/18/2016 2030   HGB 13.3 03/18/2016 2030   HCT 36.4 03/18/2016 2030   PLT 315 03/18/2016 2030   MCV 68.3 (L) 03/18/2016 2030   MCH 25.0 (L) 03/18/2016  2030   MCHC 36.5 (H) 03/18/2016 2030   RDW 17.8 (H) 03/18/2016 2030   LYMPHSABS 2.3 01/04/2014 2310   MONOABS 0.4 01/04/2014 2310   EOSABS 0.1 01/04/2014 2310   BASOSABS 0.0 01/04/2014 2310      MAU Course  Procedures  MDM CBC  Assessment and Plan  AUB- possibly due to perimenopause but, pt had thickened endometrial stipe and never had an endobx  Rec f/u in GYN ofc for Endo bx Note sent to [facility] [place] to make a f/u appt      HARRAWAY-SMITH, Burgundy Matuszak 03/18/2016, 8:33 PM

## 2016-04-29 ENCOUNTER — Other Ambulatory Visit: Payer: Self-pay | Admitting: Obstetrics and Gynecology

## 2016-05-01 ENCOUNTER — Encounter (HOSPITAL_COMMUNITY): Payer: Self-pay | Admitting: Emergency Medicine

## 2016-05-01 ENCOUNTER — Emergency Department (HOSPITAL_COMMUNITY)
Admission: EM | Admit: 2016-05-01 | Discharge: 2016-05-01 | Disposition: A | Discharge status: Home / Self Care | Payer: Self-pay | Attending: Emergency Medicine | Admitting: Emergency Medicine

## 2016-05-01 DIAGNOSIS — Z7982 Long term (current) use of aspirin: Secondary | ICD-10-CM | POA: Insufficient documentation

## 2016-05-01 DIAGNOSIS — Z79899 Other long term (current) drug therapy: Secondary | ICD-10-CM | POA: Insufficient documentation

## 2016-05-01 DIAGNOSIS — R519 Headache, unspecified: Secondary | ICD-10-CM

## 2016-05-01 DIAGNOSIS — Z794 Long term (current) use of insulin: Secondary | ICD-10-CM | POA: Insufficient documentation

## 2016-05-01 DIAGNOSIS — E119 Type 2 diabetes mellitus without complications: Secondary | ICD-10-CM | POA: Insufficient documentation

## 2016-05-01 DIAGNOSIS — F17210 Nicotine dependence, cigarettes, uncomplicated: Secondary | ICD-10-CM | POA: Insufficient documentation

## 2016-05-01 DIAGNOSIS — J069 Acute upper respiratory infection, unspecified: Secondary | ICD-10-CM | POA: Insufficient documentation

## 2016-05-01 DIAGNOSIS — R51 Headache: Secondary | ICD-10-CM

## 2016-05-01 MED ORDER — KETOROLAC TROMETHAMINE 15 MG/ML IJ SOLN
15.0000 mg | Freq: Once | INTRAMUSCULAR | Status: AC
  Administered 2016-05-01: 15 mg via INTRAVENOUS
  Filled 2016-05-01: qty 1

## 2016-05-01 MED ORDER — PSEUDOEPHEDRINE HCL 30 MG/5ML PO SYRP: 30.0000 mg | ORAL_SOLUTION | Freq: Four times a day (QID) | ORAL | 0 refills | Status: DC | PRN

## 2016-05-01 MED ORDER — METOCLOPRAMIDE HCL 5 MG/ML IJ SOLN
10.0000 mg | Freq: Once | INTRAMUSCULAR | Status: AC
  Administered 2016-05-01: 10 mg via INTRAVENOUS
  Filled 2016-05-01: qty 2

## 2016-05-01 MED ORDER — SODIUM CHLORIDE 0.9 % IV BOLUS (SEPSIS)
1000.0000 mL | Freq: Once | INTRAVENOUS | Status: AC
  Administered 2016-05-01: 1000 mL via INTRAVENOUS

## 2016-05-01 NOTE — ED Triage Notes (Signed)
Patient reports intermittent headache x2days but states worse tonight.  Denies photophobia.  Reports "mild dizziness".

## 2016-05-01 NOTE — ED Notes (Signed)
Pt ambulatory and independent at discharge.  

## 2016-05-01 NOTE — ED Provider Notes (Signed)
[facility] DEPT Provider Note   CSN: [id number] Arrival date & time: 05/01/16  0024  By signing my name below, I, Jeanell Sparrow, attest that this documentation has been prepared under the direction and in the presence of Varney Biles, MD . Electronically Signed: Jeanell Sparrow, Scribe. 05/01/2016. 1:05 AM.  History   Chief Complaint Chief Complaint  Patient presents with  . Headache   The history is provided by the patient. No language interpreter was used.   HPI Comments: Kristy [name] is a 50 y.o. female who presents to the Emergency Department complaining of a intermittent moderate frontal headache that started today. She states that her pain worsened and became constant today. She describes the pain as throbbing, 10/10 in severity, and unrelieved by tylenol. She states she has associated neck pain.    Pt also complains of URI-like symptoms that started about 2 weeks ago. She reports her symptoms are nasal congestion, rhinorrhea, dry cough, and watery eye discharge. She has been using alka-seltzer and robitussin for symptoms. She reports a hx of DM and back pain. She denies any hx of headache, sick contacts, facial pain, numbness, or visual disturbance.   Past Medical History:  Diagnosis Date  . Anemia   . Chronic back pain   . Diabetes mellitus type II   . Hyperlipidemia   . Kidney stones     Patient Active Problem List   Diagnosis Date Noted  . Abnormal uterine bleeding 12/30/2015  . Chronic left shoulder pain 02/11/2015  . Nail abnormality 09/16/2014  . Left knee pain 06/23/2014  . Skin infection 12/07/2012  . Microcytic anemia 07/11/2012  . Paresthesia of left leg 07/08/2012  . Low back pain 10/20/2011  . Metabolic syndrome X 0000000  . Headache(784.0) 09/24/2011  . GERD (gastroesophageal reflux disease) 09/02/2011  . Chronic back pain 12/02/2010  . Hyperlipidemia 12/01/2010  . DM (diabetes mellitus), type 2, uncontrolled ([place]) 12/01/2010  . Obesity  12/01/2010    Past Surgical History:  Procedure Laterality Date  . CESAREAN SECTION    . KIDNEY STONE SURGERY    . TUBAL LIGATION      OB History    Gravida Para Term Preterm AB Living   2 2 2     2    SAB TAB Ectopic Multiple Live Births                   Home Medications    Prior to Admission medications   Medication Sig Start Date End Date Taking? Authorizing Provider  acetaminophen (TYLENOL) 500 MG tablet Take 1,000 mg by mouth every 6 (six) hours as needed for pain.    Historical Provider, MD  aspirin 81 MG tablet Take 81 mg by mouth daily. Reported on 12/26/2015    Historical Provider, MD  calcium carbonate (TUMS - DOSED IN MG ELEMENTAL CALCIUM) 500 MG chewable tablet Chew 2 tablets by mouth 2 (two) times daily as needed for indigestion or heartburn.    Historical Provider, MD  insulin glargine (LANTUS) 100 unit/mL SOPN Inject 0.3 mLs (30 Units total) into the skin 2 (two) times daily. 12/26/15   Leeanne Rio, MD  Insulin Pen Needle (BD ULTRA-FINE PEN NEEDLES) 29G X 12.7MM MISC Check blood sugar 4x daily 03/28/14   Leeanne Rio, MD  metFORMIN (GLUCOPHAGE-XR) 500 MG 24 hr tablet Take 1 tablet (500 mg total) by mouth 2 (two) times daily. 12/26/15   Leeanne Rio, MD  pseudoephedrine (SUDAFED) 30 MG/5ML syrup Take 5 mLs (30  mg total) by mouth every 6 (six) hours as needed for congestion. 05/01/16   Varney Biles, MD    Family History Family History  Problem Relation Age of Onset  . Adopted: Yes  . Alcohol abuse Neg Hx     Social History Social History  Substance Use Topics  . Smoking status: Light Tobacco Smoker    Packs/day: 0.25    Types: Cigarettes  . Smokeless tobacco: Never Used     Comment: one cigarette daily  . Alcohol use 0.0 oz/week     Allergies   Fish allergy and Oxycodone   Review of Systems Review of Systems 10 Systems reviewed and all are negative for acute change except as noted in the HPI.   Physical Exam Updated Vital  Signs BP 132/91 (BP Location: Left Arm)   Pulse 97   Temp 97.8 F (36.6 C) (Oral)   Ht 4\' 11"  (1.499 m)   Wt 185 lb (83.9 kg)   LMP 03/02/2016 (Approximate)   SpO2 98%   BMI 37.37 kg/m   Physical Exam  Constitutional: She appears well-developed and well-nourished. No distress.  HENT:  Head: Normocephalic and atraumatic.  Right Ear: External ear normal.  Left Ear: External ear normal.  Mouth/Throat: Posterior oropharyngeal edema present. No oropharyngeal exudate or posterior oropharyngeal erythema.  Eyes: Conjunctivae are normal. Right eye exhibits no discharge. Left eye exhibits no discharge. No scleral icterus.  Neck: Neck supple. No tracheal deviation present.  Mild cervical lymphadenopathy anteriorly. No meningeal signs or nuchal rigidity.   Cardiovascular: Normal rate.   Pulmonary/Chest: Effort normal. No stridor. No respiratory distress.  Abdominal: She exhibits no distension.  Musculoskeletal: She exhibits no edema.  Lymphadenopathy:    She has cervical adenopathy.  Neurological: She is alert. Cranial nerve deficit: no gross deficits.  Cranial nerves 2-12 intact. Cerebellar exam reveals no dysmetria Pain is worse with palpation to the temples but not maxillary region.   Skin: Skin is warm and dry. No rash noted.  Psychiatric: She has a normal mood and affect.  Nursing note and vitals reviewed.    ED Treatments / Results  DIAGNOSTIC STUDIES: Oxygen Saturation is 98% on RA, normal by my interpretation.    COORDINATION OF CARE: 1:09 AM- Pt advised of plan for treatment and pt agrees.  2:42 AM- Pt reports that the headache is currently resolved. No other concerning symptoms noted. Pt is agreeable to being discharged home.   Labs (all labs ordered are listed, but only abnormal results are displayed) Labs Reviewed - No data to display  EKG  EKG Interpretation None       Radiology No results found.  Procedures Procedures (including critical care  time)  Medications Ordered in ED Medications  sodium chloride 0.9 % bolus 1,000 mL (0 mLs Intravenous Stopped 05/01/16 0225)  ketorolac (TORADOL) 15 MG/ML injection 15 mg (15 mg Intravenous Given 05/01/16 0121)  metoCLOPramide (REGLAN) injection 10 mg (10 mg Intravenous Given 05/01/16 0121)     Initial Impression / Assessment and Plan / ED Course  I have reviewed the triage vital signs and the nursing notes.  Pertinent labs & imaging results that were available during my care of the patient were reviewed by me and considered in my medical decision making (see chart for details).  Clinical Course   I personally performed the services described in this documentation, which was scribed in my presence. The recorded information has been reviewed and is accurate.  Pt comes in w/ headaches and URI like  symptoms. No associated vomiting, visual complains, seizures, altered mental status, loss of consciousness, new weakness, or numbness, no gait instability. No meningismus or fevers. We dont think pt has brain infection/bleed. No thrombosis risk factors. She has URI - but we dont think pt has cavernous sinus thrombosis. Not completely convinced if the headaches are sinus headaches... Plan is to hydrate. She likely has URI.     Final Clinical Impressions(s) / ED Diagnoses   Final diagnoses:  Viral URI  Sinus headache    New Prescriptions Discharge Medication List as of 05/01/2016  2:37 AM       Varney Biles, MD 05/04/16 2353

## 2016-05-21 ENCOUNTER — Other Ambulatory Visit: Payer: Self-pay | Admitting: Obstetrics and Gynecology

## 2016-06-01 ENCOUNTER — Telehealth: Payer: Self-pay | Admitting: *Deleted

## 2016-06-01 MED ORDER — INSULIN GLARGINE 100 UNITS/ML SOLOSTAR PEN: 30.0000 [IU] | PEN_INJECTOR | Freq: Two times a day (BID) | SUBCUTANEOUS | 0 refills | Status: DC

## 2016-06-01 NOTE — Telephone Encounter (Signed)
This is on her medication list - it's the lantus SOPN. Sent in a months worth to [facility]. Please ask patient to schedule appointment. I last saw her in June. She needs to follow up more regularly.  Thanks, Leeanne Rio, MD

## 2016-06-01 NOTE — Telephone Encounter (Signed)
Received refill request for Lantus SoloStar from [facility].  Medication is not listed on current med list. Please advise.  Derl Barrow, RN

## 2016-06-02 NOTE — Telephone Encounter (Signed)
Pt informed and scheduled for an appt. Angelito Hopping, CMA  

## 2016-06-16 ENCOUNTER — Ambulatory Visit: Payer: Self-pay | Admitting: Family Medicine

## 2016-06-22 ENCOUNTER — Encounter: Payer: Self-pay | Admitting: Obstetrics and Gynecology

## 2016-06-22 ENCOUNTER — Other Ambulatory Visit: Payer: Self-pay | Admitting: Obstetrics and Gynecology

## 2016-06-22 NOTE — Progress Notes (Signed)
Patient did not keep GYN appointment for 06/22/2016.  Durene Romans MD Attending [facility] for [employer] farm [facility])

## 2016-06-30 ENCOUNTER — Ambulatory Visit (INDEPENDENT_AMBULATORY_CARE_PROVIDER_SITE_OTHER): Payer: Self-pay | Admitting: Family Medicine

## 2016-06-30 ENCOUNTER — Encounter: Payer: Self-pay | Admitting: Family Medicine

## 2016-06-30 VITALS — BP 122/76 | HR 89 | Temp 98.2°F | Ht 59.0 in | Wt 181.0 lb

## 2016-06-30 DIAGNOSIS — M545 Low back pain: Secondary | ICD-10-CM

## 2016-06-30 DIAGNOSIS — E785 Hyperlipidemia, unspecified: Secondary | ICD-10-CM

## 2016-06-30 DIAGNOSIS — IMO0001 Reserved for inherently not codable concepts without codable children: Secondary | ICD-10-CM

## 2016-06-30 DIAGNOSIS — Z1211 Encounter for screening for malignant neoplasm of colon: Secondary | ICD-10-CM

## 2016-06-30 DIAGNOSIS — E1165 Type 2 diabetes mellitus with hyperglycemia: Secondary | ICD-10-CM

## 2016-06-30 DIAGNOSIS — N939 Abnormal uterine and vaginal bleeding, unspecified: Secondary | ICD-10-CM

## 2016-06-30 DIAGNOSIS — G8929 Other chronic pain: Secondary | ICD-10-CM

## 2016-06-30 LAB — POCT GLYCOSYLATED HEMOGLOBIN (HGB A1C): HEMOGLOBIN A1C: 11.8

## 2016-06-30 LAB — COMPLETE METABOLIC PANEL WITH GFR
ALT: 9 U/L (ref 6–29)
AST: 10 U/L (ref 10–35)
Albumin: 3.7 g/dL (ref 3.6–5.1)
Alkaline Phosphatase: 87 U/L (ref 33–130)
BUN: 10 mg/dL (ref 7–25)
CALCIUM: 9.3 mg/dL (ref 8.6–10.4)
CHLORIDE: 99 mmol/L (ref 98–110)
CO2: 29 mmol/L (ref 20–31)
Creat: 0.61 mg/dL (ref 0.50–1.05)
GFR, Est Non African American: >89 mL/min (ref >=60)
Glucose, Bld: 317 mg/dL — ABNORMAL HIGH (ref 65–99)
POTASSIUM: 4.2 mmol/L (ref 3.5–5.3)
Sodium: 134 mmol/L — ABNORMAL LOW (ref 135–146)
Total Bilirubin: 1 mg/dL (ref 0.2–1.2)
Total Protein: 7 g/dL (ref 6.1–8.1)

## 2016-06-30 LAB — LIPID PANEL
CHOL/HDL RATIO: 8.1 ratio — AB (ref <5.0)
CHOLESTEROL: 226 mg/dL — AB (ref <200)
HDL: 28 mg/dL — ABNORMAL LOW (ref >50)
LDL CALC: 165 mg/dL — AB (ref <100)
TRIGLYCERIDES: 165 mg/dL — AB (ref <150)
VLDL: 33 mg/dL — AB (ref <30)

## 2016-06-30 LAB — CBC
HCT: 40.4 % (ref 35.0–45.0)
Hemoglobin: 12.7 g/dL (ref 11.7–15.5)
MCH: 24.1 pg — AB (ref 27.0–33.0)
MCHC: 31.4 g/dL — ABNORMAL LOW (ref 32.0–36.0)
MCV: 76.7 fL — AB (ref 80.0–100.0)
MPV: 9.4 fL (ref 7.5–12.5)
PLATELETS: 326 10*3/uL (ref 140–400)
RBC: 5.27 MIL/uL — ABNORMAL HIGH (ref 3.80–5.10)
RDW: 16 % — AB (ref 11.0–15.0)
WBC: 6.7 10*3/uL (ref 3.8–10.8)

## 2016-06-30 MED ORDER — INSULIN GLARGINE 100 UNITS/ML SOLOSTAR PEN: 35.0000 [IU] | PEN_INJECTOR | Freq: Two times a day (BID) | SUBCUTANEOUS | Status: DC

## 2016-06-30 MED ORDER — INSULIN GLARGINE 100 UNITS/ML SOLOSTAR PEN: 30.0000 [IU] | PEN_INJECTOR | Freq: Two times a day (BID) | SUBCUTANEOUS | Status: DC

## 2016-06-30 MED ORDER — GABAPENTIN 100 MG PO CAPS: 100.0000 mg | ORAL_CAPSULE | Freq: Three times a day (TID) | ORAL | 3 refills | Status: DC

## 2016-06-30 NOTE — Progress Notes (Signed)
Date of Visit: 06/30/2016   HPI:  Patient presents for follow up.   Diabetes - currently taking lantus 30 units twice daily. Checks sugars only once per week, usually checks around lunchtime. Does not eat breakfast most days. Sugars run in 200s when she checks, these are usually fastings. Denies chest pain or shortness of breath. Does occasionally get low blood sugars, can tell by how she feels.  Back pain - has history of back issues for several years. Reports history of ruptured disc. Used to take steroid pills for back pain. Worse over the last few months. Denies having fever, saddle anesthesia, lower extremity weakness, or problems with stooling or urination. Has taken tylenol 650mg  tabs, 3 at a time, twice a day. Helps sometimes, but not as much lately. Pain is in low back. Also has some pains in her legs.   Vaginal bleeding - continues to have abnormal vaginal bleeding. Has repeatedly no showed or canceled GYN appts. Last visit I ordered pelvic u/s which she also no-showed to. Has GYN appt scheduled for 12/28. Prefers to just attend this appointment rather than have another ultrasound scheduled.  ROS: See HPI.  [place]: history of type 2 diabetes, abnormal uterine bleeding, chronic back pain, obesity, hyperlipidemia   PHYSICAL EXAM: BP 122/76   Pulse 89   Temp 98.2 F (36.8 C) (Oral)   Ht 4\' 11"  (1.499 m)   Wt 181 lb (82.1 kg)   BMI 36.56 kg/m  Gen: NAD, pleasant, cooperative HEENT: normocephalic, atraumatic, moist mucous membranes  Heart: regular rate and rhythm, no murmur Lungs: clear to auscultation bilaterally normal work of breathing  Neuro: alert grossly nonfocal, speech normal Ext: full strength bilateral lower extremities. 2+ reflexes bilateral lower extremities. Sensation intact Back: low back paraspinal muscles tender to palpation   ASSESSMENT/PLAN:  Health maintenance:  -Offered flu shot to patient today, but patient declined.  -recommended eye exam, has orange  card so will refer to get on wait list -refer for colonoscopy -gave handout on scheduling mammogram -possibly will get pap at GYN visit later this month -plan for urine microalbumin at next visit   DM (diabetes mellitus), type 2, uncontrolled Remains uncontrolled with A1c of 11.8. Due to noncompliance with follow up, we have never been able to adequately adjust her insulin regimen. She is not checking sugars regularly enough for me to safely adjust insulin today, given report of occasional hypoglycemic symptoms. Given CBG chart today, instructed to follow up in next several weeks to adjust insulin. Stressed importance of follow up. Patient has poor insight into how her lack of follow up is contributing to uncontrolled diabetes.   Cardiac: check lipids. Likely needs statin but at some point crestor fell off her medication list Renal: check CMET today. Plan urine microalbumin at next visit Eye: refer for this Foot: UTD Immunizations: declines flu shot today   Hyperlipidemia Check lipids today. Anticipate will need statin resumed.  Abnormal uterine bleeding Unfortunately this continues but she has repeatedly no-showed or canceled appts with GYN. Has appointment with them later this month. Check CBC to ensure stability today.  Chronic back pain Musculoskeletal, no red flags. Trial of combined tylenol & ibuprofen scheduled together q6h, per after visit summary. Counseled not to take in excess of this. Also may have some component of diabetic neuropathy. Trial of low dose gabapentin. Counseled on sedation risk   FOLLOW UP: Follow up in 3 weeks in office for diabetes  Call in 1 wk with blood sugars to adjust insulin  [place]. Ardelia Mems, [place]

## 2016-06-30 NOTE — Patient Instructions (Addendum)
See the handout on how to schedule your mammogram. This is an important test to screen for breast cancer.  Referring for colonoscopy & eye exam  For back pain, take one pill of ibuprofen (200mg ) and one pill of tylenol (500mg ) Take both pills at the same time. Do this every 6 hours.   For legs - try gabapentin 100mg  three times daily  Use caution as it might make you sleepy  labwork today  Follow up with me in 3 weeks  Call in 1 week with what your sugars are. Check in morning, after lunch, and after dinner We need this info in order to change your insulin dose   Be well, Dr. Ardelia Mems

## 2016-07-03 NOTE — Assessment & Plan Note (Signed)
Musculoskeletal, no red flags. Trial of combined tylenol & ibuprofen scheduled together q6h, per after visit summary. Counseled not to take in excess of this. Also may have some component of diabetic neuropathy. Trial of low dose gabapentin. Counseled on sedation risk

## 2016-07-03 NOTE — Assessment & Plan Note (Signed)
Remains uncontrolled with A1c of 11.8. Due to noncompliance with follow up, we have never been able to adequately adjust her insulin regimen. She is not checking sugars regularly enough for me to safely adjust insulin today, given report of occasional hypoglycemic symptoms. Given CBG chart today, instructed to follow up in next several weeks to adjust insulin. Stressed importance of follow up. Patient has poor insight into how her lack of follow up is contributing to uncontrolled diabetes.   Cardiac: check lipids. Likely needs statin but at some point crestor fell off her medication list Renal: check CMET today. Plan urine microalbumin at next visit Eye: refer for this Foot: UTD Immunizations: declines flu shot today

## 2016-07-03 NOTE — Assessment & Plan Note (Signed)
Unfortunately this continues but she has repeatedly no-showed or canceled appts with GYN. Has appointment with them later this month. Check CBC to ensure stability today.

## 2016-07-03 NOTE — Assessment & Plan Note (Signed)
Check lipids today. Anticipate will need statin resumed.

## 2016-07-08 ENCOUNTER — Telehealth: Payer: Self-pay | Admitting: Family Medicine

## 2016-07-08 ENCOUNTER — Encounter: Payer: Self-pay | Admitting: Family Medicine

## 2016-07-08 DIAGNOSIS — E1165 Type 2 diabetes mellitus with hyperglycemia: Secondary | ICD-10-CM

## 2016-07-08 DIAGNOSIS — E118 Type 2 diabetes mellitus with unspecified complications: Secondary | ICD-10-CM

## 2016-07-08 DIAGNOSIS — Z794 Long term (current) use of insulin: Principal | ICD-10-CM

## 2016-07-08 DIAGNOSIS — IMO0002 Reserved for concepts with insufficient information to code with codable children: Secondary | ICD-10-CM

## 2016-07-08 MED ORDER — ROSUVASTATIN CALCIUM 20 MG PO TABS: 20.0000 mg | ORAL_TABLET | Freq: Every day | ORAL | 3 refills | Status: DC

## 2016-07-08 NOTE — Telephone Encounter (Signed)
Called patient to discuss cholesterol results - 10 year ASCVD risk calculated at score >10% (20% if include smoking, which is presently documented in chart). Needs high intensity statin. Will rx crestor 20mg  daily, [facility] to [facility]. Advised patient we may have to change the rx depending on what is currently available on their formulary, but will start here.  Also asked her about her blood sugars - she says they are coming down. She is eating less and has cut out sodas. Did not check blood sugar today. Reiterated to her that it would be very helpful if she could check her sugars so that we can adjust her insulin safely. Also brought up the idea of seeing a nutritionist. She is agreeable. Will place referral &  Message Dr. Jenne Campus.  Also reminded her of the importance of keeping GYN appointment later this month.  Leeanne Rio, MD

## 2016-07-09 MED ORDER — ATORVASTATIN CALCIUM 80 MG PO TABS: 80.0000 mg | ORAL_TABLET | Freq: Every day | ORAL | 3 refills | Status: DC

## 2016-07-09 NOTE — Telephone Encounter (Signed)
Addendum - received fax from [facility] that they no longer offer crestor. Will switch to lipitor 80mg  daily. Rx faxed to [facility].  Leeanne Rio, MD

## 2016-07-09 NOTE — Addendum Note (Signed)
Addended by: Leeanne Rio on: 07/09/2016 04:42 PM   Modules accepted: Orders

## 2016-07-15 ENCOUNTER — Other Ambulatory Visit: Payer: Self-pay | Admitting: *Deleted

## 2016-07-15 MED ORDER — METFORMIN HCL ER 500 MG PO TB24: 500.0000 mg | ORAL_TABLET | Freq: Two times a day (BID) | ORAL | 3 refills | Status: DC

## 2016-07-23 ENCOUNTER — Encounter: Payer: Self-pay | Admitting: Obstetrics and Gynecology

## 2016-07-23 ENCOUNTER — Ambulatory Visit (INDEPENDENT_AMBULATORY_CARE_PROVIDER_SITE_OTHER): Payer: Self-pay | Admitting: Obstetrics and Gynecology

## 2016-07-23 ENCOUNTER — Other Ambulatory Visit (HOSPITAL_COMMUNITY)
Admission: RE | Admit: 2016-07-23 | Discharge: 2016-07-23 | Disposition: A | Discharge status: Home / Self Care | Payer: Self-pay | Source: Ambulatory Visit | Attending: Obstetrics and Gynecology | Admitting: Obstetrics and Gynecology

## 2016-07-23 VITALS — BP 132/85 | HR 93 | Wt 176.4 lb

## 2016-07-23 DIAGNOSIS — Z1151 Encounter for screening for human papillomavirus (HPV): Secondary | ICD-10-CM

## 2016-07-23 DIAGNOSIS — Z124 Encounter for screening for malignant neoplasm of cervix: Secondary | ICD-10-CM

## 2016-07-23 DIAGNOSIS — Z3202 Encounter for pregnancy test, result negative: Secondary | ICD-10-CM

## 2016-07-23 DIAGNOSIS — N939 Abnormal uterine and vaginal bleeding, unspecified: Secondary | ICD-10-CM | POA: Insufficient documentation

## 2016-07-23 LAB — POCT PREGNANCY, URINE: PREG TEST UR: NEGATIVE

## 2016-07-23 NOTE — Procedures (Signed)
Endometrial Biopsy Procedure Note  Pre-operative Diagnosis: AUB.  Post-operative Diagnosis: AUB. Vaginal atrophy. Cervical stenosis  Procedure Details  The risks (including infection, bleeding, pain, and uterine perforation) and benefits of the procedure were explained to the patient and Written informed consent was obtained.  The patient was placed in the dorsal lithotomy position.  Bimanual exam showed the uterus to be in the neutral position.  A Graves' speculum inserted in the vagina.  The vagina had moderate atrophy and loss of rugae and the cervix was near flush with the vagina and deviated to the left of the patient. There was minimal amount of old blood in the vault.  The anterior lip of the cervix was grasped with a tenaculum and a pap smear was performed. Next the cervix was swabbed with betadine. The pipelle was unable to pass past the internal os (sounded to 4cm), so os finders were used and the internal os easily opened.A pipelle was inserted into the uterine cavity and sounded the uterus to a depth of 8.5cm.  A Moderate amount of tissue was collected after 3 passes. The sample was sent for pathologic examination.  Condition: Stable  Complications: None  Plan: The patient was advised to call for any fever or for prolonged or severe pain or bleeding. She was advised to use OTC analgesics as needed for mild to moderate pain. She was advised to avoid vaginal intercourse for 48 hours or until the bleeding has completely stopped.  Durene Romans MD Attending [facility] for [employer] farm manager)

## 2016-07-23 NOTE — Progress Notes (Addendum)
Obstetrics and Gynecology Visit Established Patient Problem Visit   Appointment Date: 07/23/2016  OBGYN Clinic: [facility] [place]  Primary Care Provider: Chrisandra Netters  Referring Provider: Leeanne Rio, MD  Chief Complaint: endometrial biopsy for AUB History of Present Illness: Kristy [name] is a 50 y.o. African-American [id number] (LMP: 12/27), seen for the above chief complaint. Her past medical history is significant for c/s x 2 and BTL, DM   She had AUB in in the middle of this year and has missed multiple evaluations and ultrasound appointments. She states that she has periods that come qmonth and last for about 3-4d and aren't particularly heavy or painful and she denies any intermenstrual bleeding. No h/o HRT and no HF or NS.    No fevers, chills, chest pain, SOB, nausea, vomiting, abdominal pain, dysuria, hematuria, vaginal itching, diarrhea, constipation, blood in BMs  Review of Systems:  as noted in the History of Present Illness.  Past Medical History:  Past Medical History:  Diagnosis Date  . Anemia   . Chronic back pain   . Diabetes mellitus type II   . Hyperlipidemia   . Kidney stones     Past Surgical History:  Past Surgical History:  Procedure Laterality Date  . CESAREAN SECTION    . KIDNEY STONE SURGERY    . TUBAL LIGATION      Past Obstetrical History:  OB History  Gravida Para Term Preterm AB Living  2 2 2     2   SAB TAB Ectopic Multiple Live Births               # Outcome Date GA Lbr Len/2nd Weight Sex Delivery Anes PTL Lv  2 Term           1 Term               Past Gynecological History: As per HPI. Pap smear history unknown    Social History:  Social History   Social History  . Marital status: Single    Spouse name: N/A  . Number of children: N/A  . Years of education: N/A   Occupational History  . Not on file.   Social History Main Topics  . Smoking status: Light Tobacco Smoker    Packs/day: 0.25    Types:  Cigarettes  . Smokeless tobacco: Never Used     Comment: one cigarette daily  . Alcohol use 0.0 oz/week  . Drug use: No  . Sexual activity: Yes    Birth control/ protection: Surgical   Other Topics Concern  . Not on file   Social History Narrative   Lives with son [name], in his 35s.    Family History:  Family History  Problem Relation Age of Onset  . Adopted: Yes  . Alcohol abuse Neg Hx     Medications [name] had no medications administered during this visit. Current Outpatient Prescriptions  Medication Sig Dispense Refill  . atorvastatin (LIPITOR) 80 MG tablet Take 1 tablet (80 mg total) by mouth daily. 90 tablet 3  . insulin glargine (LANTUS) 100 unit/mL SOPN Inject 0.3 mLs (30 Units total) into the skin 2 (two) times daily.    . Insulin Pen Needle (BD ULTRA-FINE PEN NEEDLES) 29G X 12.7MM MISC Check blood sugar 4x daily 100 each 11  . metFORMIN (GLUCOPHAGE-XR) 500 MG 24 hr tablet Take 1 tablet (500 mg total) by mouth 2 (two) times daily. 60 tablet 3  . acetaminophen (TYLENOL) 500 MG tablet Take 1,000  mg by mouth every 6 (six) hours as needed for pain.    Marland Kitchen aspirin 81 MG tablet Take 81 mg by mouth daily. Reported on 12/26/2015    . calcium carbonate (TUMS - DOSED IN MG ELEMENTAL CALCIUM) 500 MG chewable tablet Chew 2 tablets by mouth 2 (two) times daily as needed for indigestion or heartburn.    . gabapentin (NEURONTIN) 100 MG capsule Take 1 capsule (100 mg total) by mouth 3 (three) times daily. (Patient not taking: Reported on 07/23/2016) 90 capsule 3   No current facility-administered medications for this visit.     Allergies Fish allergy and Oxycodone   Physical Exam:  BP 132/85   Pulse 93   Wt 176 lb 6.4 oz (80 kg)   LMP 07/14/2016 (Exact Date)   BMI 35.63 kg/m  Body mass index is 35.63 kg/m. General appearance: Well nourished, well developed female in no acute distress.  Cardiovascular: normal s1 and s2.  No murmurs, rubs or gallops. Respiratory:  Clear to  auscultation bilateral. Normal respiratory effort Abdomen: positive bowel sounds and no masses, hernias; diffusely non tender to palpation, non distended Neuro/Psych:  Normal mood and affect.  Skin:  Warm and dry.  Lymphatic:  No inguinal lymphadenopathy.   Pelvic exam: is limited by body habitus EGBUS: within normal limits, Vagina: within normal limits, atrophic and with minimal blood  (old) in the vault, Cervix: atrophic and near flush with the vagina and deviated to the patient's left, no tenderness, discharge or lesions. Uterus:  Nonenlarged, approximately 8wk sized and non tender and Adnexa:  normal adnexa and no mass, fullness, tenderness Rectovaginal: deferred  See procedure note for embx  Laboratory: UPT negative  Radiology: none  Assessment: doing well s/p pap and embx  Plan:  Follow up labs D/w pt importance of follow up and may need u/s depending on if AUB persists.   RTC PRN  Durene Romans MD Attending Center for [facility] [employer] [facility])

## 2016-07-24 ENCOUNTER — Telehealth: Payer: Self-pay | Admitting: *Deleted

## 2016-07-24 MED ORDER — INSULIN GLARGINE 100 UNITS/ML SOLOSTAR PEN: 30.0000 [IU] | PEN_INJECTOR | Freq: Two times a day (BID) | SUBCUTANEOUS | 3 refills | Status: DC

## 2016-07-24 NOTE — Telephone Encounter (Signed)
Rx sent in. Thanks! Tully Mcinturff J Taiwana Willison, MD  

## 2016-07-24 NOTE — Telephone Encounter (Signed)
Dawn from [facility] calling requesting refill on lantus solostar pen for patient

## 2016-07-28 ENCOUNTER — Telehealth: Payer: Self-pay | Admitting: Obstetrics and Gynecology

## 2016-07-28 DIAGNOSIS — C55 Malignant neoplasm of uterus, part unspecified: Secondary | ICD-10-CM

## 2016-07-28 DIAGNOSIS — C541 Malignant neoplasm of endometrium: Secondary | ICD-10-CM

## 2016-07-28 NOTE — Telephone Encounter (Signed)
GYN telephone note D/w her re: uterine cancer diagnosis and our clinic will set her up to see Gyn Onc and for a pelvic u/s and CXR. Pap smear pending still. D/w her likely need for surgery and possible LND and possibility of adjuvant chemo/XRT but high cure rate usually for uterine cancer with close follow up. Patient amenable to plan  Durene Romans MD Attending [facility]) 07/28/2016 Time: [phone]

## 2016-07-29 LAB — CYTOLOGY - PAP
DIAGNOSIS: UNDETERMINED — AB
HPV (WINDOPATH): NOT DETECTED

## 2016-07-30 ENCOUNTER — Telehealth: Payer: Self-pay

## 2016-07-30 NOTE — Telephone Encounter (Signed)
Per Dr. Ilda Basset, pt needs a referral to gyn onc asap for uterine cancer.  Pt also needs a chest x- ray (PA and lateral) and a pelvic U/S scheduled.   LM for gyn onc to call and schedule pt an appt asap.

## 2016-07-31 NOTE — Telephone Encounter (Signed)
Spoke with nurse in regards to getting an appt.  I was informed that Kristy [name] is currently looking for an appt now and she should done within the hour.  I requested that the appt to be scheduled after her US/chest-xray appt on 08/04/16.  The nurse stated understanding.

## 2016-08-03 ENCOUNTER — Ambulatory Visit: Payer: Self-pay | Admitting: Family Medicine

## 2016-08-03 ENCOUNTER — Telehealth: Payer: Self-pay | Admitting: Family Medicine

## 2016-08-03 NOTE — Telephone Encounter (Signed)
Pt is calling because she rescheduled her appointment for today until 09/01/16. She wanted the doctor to know that she was doing good. She said that if the doctor had any questions to call her. jw

## 2016-08-03 NOTE — Telephone Encounter (Signed)
Received call from [facility], gyn onc, that pt's appt scheduled for 08/12/16 @ 1500.  Pt will need to be there at 1430 and prepared for a pelvic exam.  Notified pt of appt and clarified pt's chest x-ray and pelvic US scheduled for 08/04/16.  Pt stated that she was aware of her appt's for tomorrow and did not have any other questions.

## 2016-08-03 NOTE — Telephone Encounter (Signed)
Noted Kristy Tippetts [name], MD  

## 2016-08-04 ENCOUNTER — Other Ambulatory Visit: Payer: Self-pay | Admitting: Obstetrics and Gynecology

## 2016-08-04 ENCOUNTER — Ambulatory Visit (HOSPITAL_COMMUNITY)
Admission: RE | Admit: 2016-08-04 | Discharge: 2016-08-04 | Disposition: A | Discharge status: Home / Self Care | Payer: Self-pay | Source: Ambulatory Visit | Attending: Obstetrics and Gynecology | Admitting: Obstetrics and Gynecology

## 2016-08-04 ENCOUNTER — Encounter (HOSPITAL_COMMUNITY): Payer: Self-pay | Admitting: Radiology

## 2016-08-04 DIAGNOSIS — Z87891 Personal history of nicotine dependence: Secondary | ICD-10-CM | POA: Insufficient documentation

## 2016-08-04 DIAGNOSIS — Z780 Asymptomatic menopausal state: Secondary | ICD-10-CM | POA: Insufficient documentation

## 2016-08-04 DIAGNOSIS — E119 Type 2 diabetes mellitus without complications: Secondary | ICD-10-CM | POA: Insufficient documentation

## 2016-08-04 DIAGNOSIS — N939 Abnormal uterine and vaginal bleeding, unspecified: Secondary | ICD-10-CM | POA: Insufficient documentation

## 2016-08-04 DIAGNOSIS — C55 Malignant neoplasm of uterus, part unspecified: Secondary | ICD-10-CM

## 2016-08-05 ENCOUNTER — Telehealth (HOSPITAL_COMMUNITY): Payer: Self-pay | Admitting: *Deleted

## 2016-08-05 NOTE — Telephone Encounter (Signed)
Telephoned patient at home number and discussed abnormal pap smear. Advised patient HPV was negative. Advised patient next pap smear due in one year. This patient was in [place] call back. Patient voiced understanding.

## 2016-08-12 ENCOUNTER — Ambulatory Visit: Payer: Self-pay | Admitting: Gynecologic Oncology

## 2016-08-18 ENCOUNTER — Encounter: Payer: Self-pay | Admitting: Gynecologic Oncology

## 2016-08-18 ENCOUNTER — Ambulatory Visit: Payer: Self-pay | Attending: Gynecologic Oncology | Admitting: Gynecologic Oncology

## 2016-08-18 VITALS — BP 114/75 | HR 102 | Temp 100.1°F | Resp 16 | Ht 59.0 in | Wt 177.3 lb

## 2016-08-18 DIAGNOSIS — E1165 Type 2 diabetes mellitus with hyperglycemia: Secondary | ICD-10-CM | POA: Insufficient documentation

## 2016-08-18 DIAGNOSIS — D649 Anemia, unspecified: Secondary | ICD-10-CM | POA: Insufficient documentation

## 2016-08-18 DIAGNOSIS — G8929 Other chronic pain: Secondary | ICD-10-CM | POA: Insufficient documentation

## 2016-08-18 DIAGNOSIS — E1100 Type 2 diabetes mellitus with hyperosmolarity without nonketotic hyperglycemic-hyperosmolar coma (NKHHC): Secondary | ICD-10-CM

## 2016-08-18 DIAGNOSIS — E6609 Other obesity due to excess calories: Secondary | ICD-10-CM

## 2016-08-18 DIAGNOSIS — F17210 Nicotine dependence, cigarettes, uncomplicated: Secondary | ICD-10-CM | POA: Insufficient documentation

## 2016-08-18 DIAGNOSIS — E8881 Metabolic syndrome: Secondary | ICD-10-CM

## 2016-08-18 DIAGNOSIS — Z87442 Personal history of urinary calculi: Secondary | ICD-10-CM | POA: Insufficient documentation

## 2016-08-18 DIAGNOSIS — E785 Hyperlipidemia, unspecified: Secondary | ICD-10-CM | POA: Insufficient documentation

## 2016-08-18 DIAGNOSIS — Z9889 Other specified postprocedural states: Secondary | ICD-10-CM | POA: Insufficient documentation

## 2016-08-18 DIAGNOSIS — C569 Malignant neoplasm of unspecified ovary: Secondary | ICD-10-CM | POA: Insufficient documentation

## 2016-08-18 DIAGNOSIS — Z7982 Long term (current) use of aspirin: Secondary | ICD-10-CM | POA: Insufficient documentation

## 2016-08-18 DIAGNOSIS — Z794 Long term (current) use of insulin: Secondary | ICD-10-CM | POA: Insufficient documentation

## 2016-08-18 DIAGNOSIS — E66811 Obesity, class 1: Secondary | ICD-10-CM

## 2016-08-18 DIAGNOSIS — M549 Dorsalgia, unspecified: Secondary | ICD-10-CM | POA: Insufficient documentation

## 2016-08-18 DIAGNOSIS — N920 Excessive and frequent menstruation with regular cycle: Secondary | ICD-10-CM | POA: Insufficient documentation

## 2016-08-18 DIAGNOSIS — N939 Abnormal uterine and vaginal bleeding, unspecified: Secondary | ICD-10-CM

## 2016-08-18 DIAGNOSIS — C541 Malignant neoplasm of endometrium: Secondary | ICD-10-CM

## 2016-08-18 NOTE — Progress Notes (Signed)
Consult Note: Gyn-Onc  Consult was requested by Dr. Dennard Schaumann for the evaluation of Kristy [name] 51 y.o. female  CC:  Chief Complaint  Patient presents with  . endometrioid carcinoma    Assessment/Plan:  Ms. Kristy [name] is a 51 y.o.  with poorly controlled type 2 diabetes mellitus and the recent diagnosis of grade 2 endometrioid carcinoma. Her blood glucose is insufficiently controlled to permit intermediate intervention for the grade 2 endometrioid adenocarcinoma. Unfortunately an IUD is not an option given that this is a grade 2 endometrioid cancer. She has a follow-up visit with her family medicine physician Dr. Rory Percy within a few days. It is unlikely that with modifications the hemoglobin A1c will normalize within the next few weeks. However it's my hope that Dr. Ardelia Mems will be able to facilitate improvement in daily blood glucose levels. The plan is for laparoscopic take-assisted hysterectomy bilateral salpingo-oophorectomy and sentinel nodes on 09/09/2016 at [facility]. She'll be admitted 2 days prior to further optimize glucose control. Kristy [name] was counseled that poor glucose control will negatively impact postoperative outcome.    HPI: Ms. Kristy [name] is a 51 y.o.   who reports lifelong history of irregular menses. Her history is notable for an admission for heavy uterine bleeding in 2017 treated with a D&C. Pathology is unavailable. She states that the irregular bleeding has become heavier since May 2017. She was treated with progesterone for 3 months at that time and the bleeding was less heavy. Since discontinuation of the progestin she reports significant heavy menses. She presented for evaluation and endometrial biopsy collected on 07/15/2016 .  Pathology was notable for an endometrioid carcinoma grade 2.   Her history is notable for type 2 diabetes mellitus and a recorded hemoglobin A1c of 11.8 on 06/30/2016. At that time her blood glucose was noted to be 317. Kristy [name] admits to  minimal compliance with diabetes regulation and rare monitoring of her blood sugar levels.   Review of Systems:  Constitutional  Feels well,  Cardiovascular  No chest pain, shortness of breath, or edema  Pulmonary  No cough or wheeze.  Gastro Intestinal  No nausea, vomitting, episode of self limiting diarrhea 2 days ago. No bright red blood per rectum, no abdominal pain, change in bowel movement, or constipation.  Genito Urinary  No frequency, urgency, dysuria, denies vaginal bleeding at present Musculo Skeletal  No myalgia, arthralgia, joint swelling or pain  Neurologic  No weakness, numbness, change in gait,  Psychology  No depression, anxiety, insomnia.    Current Meds:  Outpatient Encounter Prescriptions as of 08/18/2016  Medication Sig  . acetaminophen (TYLENOL) 500 MG tablet Take 1,000 mg by mouth every 6 (six) hours as needed for pain.  Marland Kitchen aspirin 81 MG tablet Take 81 mg by mouth daily. Reported on 12/26/2015  . atorvastatin (LIPITOR) 80 MG tablet Take 1 tablet (80 mg total) by mouth daily.  . insulin glargine (LANTUS) 100 unit/mL SOPN Inject 0.3 mLs (30 Units total) into the skin 2 (two) times daily.  . Insulin Pen Needle (BD ULTRA-FINE PEN NEEDLES) 29G X 12.7MM MISC Check blood sugar 4x daily  . metFORMIN (GLUCOPHAGE-XR) 500 MG 24 hr tablet Take 1 tablet (500 mg total) by mouth 2 (two) times daily.  . calcium carbonate (TUMS - DOSED IN MG ELEMENTAL CALCIUM) 500 MG chewable tablet Chew 2 tablets by mouth 2 (two) times daily as needed for indigestion or heartburn.  . [DISCONTINUED] gabapentin (NEURONTIN) 100 MG capsule Take 1 capsule (100 mg total)  by mouth 3 (three) times daily. (Patient not taking: Reported on 07/23/2016)   No facility-administered encounter medications on file as of 08/18/2016.     Allergy:  Allergies  Allergen Reactions  . Fish Allergy Hives    hives  . Oxycodone Itching    Social Hx:   Social History   Social History  . Marital status: Single     Spouse name: N/A  . Number of children: N/A  . Years of education: N/A   Occupational History  . Not on file.   Social History Main Topics  . Smoking status: Light Tobacco Smoker    Packs/day: 0.25    Types: Cigarettes  . Smokeless tobacco: Never Used     Comment: one cigarette daily  . Alcohol use 0.0 oz/week  . Drug use: No  . Sexual activity: Yes    Birth control/ protection: Surgical   Other Topics Concern  . Not on file   Social History Narrative   Lives with son [name], in his 59s.    Past Surgical Hx:  Past Surgical History:  Procedure Laterality Date  . CESAREAN SECTION    . KIDNEY STONE SURGERY    . TUBAL LIGATION      Past Medical Hx:  Past Medical History:  Diagnosis Date  . Anemia   . Chronic back pain   . Diabetes mellitus type II   . Hyperlipidemia   . Kidney stones     Past Gynecological History:  Gravida 2 para 2 last menstrual period December 2017 reports bilateral tubal ligation in 1989. Pap test 07/29/2016 atypical squamous cells of undetermined significance high risk HPV negative.  Menarche occurred at the age of 23 and has had irregular menses since  Family Hx:  Family History  Problem Relation Age of Onset  . Adopted: Yes  . Alcohol abuse Neg Hx     Vitals:  Blood pressure 114/75, pulse (!) 102, temperature 100.1 F (37.8 C), temperature source Oral, resp. rate 16, height 4\' 11"  (1.499 m), weight 177 lb 4.8 oz (80.4 kg), SpO2 100 %.  Physical Exam: WD in NAD HEENT significant facial hirsutism unchanged for 20 years per patient report  Neck  Supple NROM, without any enlargements.  Lymph Node Survey No cervical supraclavicular or inguinal adenopathy Cardiovascular  Pulse normal rate, regularity and rhythm.  Lungs  Clear to auscultation bilaterally, without wheezes/crackles/rhonchi. Good air movement.  Skin  No rash/lesions/breakdown  Psychiatry  Alert and oriented appropriate mood affect speech and reasoning. Abdomen   Normoactive bowel sounds, abdomen soft, non-tender.  Back No CVA tenderness Genito Urinary  Vulva/vagina: Normal external female genitalia.  No lesions. No discharge or bleeding.  Bladder/urethra:  No lesions or masses  Vagina: No lesions no pooling no discharge  Cervix: Normal appearing, no lesions. Lab present at the external os, cervix approximately 3 cm  Uterus: Mobile, approximately 10 cm no parametrial involvement or nodularity.  Adnexa: No palpable masses. Rectal  Good tone, no masses no cul de sac nodularity.  Extremities  No bilateral cyanosis, clubbing or edema.   Janie Morning, MD, PhD 08/18/2016, 5:54 PM

## 2016-08-18 NOTE — Patient Instructions (Addendum)
Please get an appointment with Dr. Ardelia Mems as soon as possible.  Plan for surgery at [facility] on September 09, 2016 with Dr. Janie Morning.  You will be scheduled for a robotic assisted total hysterectomy, bilateral salpingo-oophorectomy, sentinel lymph node biopsy. We will plan on admitting you to the hospital several days before to assist with blood sugar control prior to surgery.  You will receive a phone call from [facility] at [facility] with additional information about your surgery.  Please call for any questions or concerns.

## 2016-08-19 ENCOUNTER — Telehealth: Payer: Self-pay | Admitting: Family Medicine

## 2016-08-19 NOTE — Telephone Encounter (Signed)
Called patient to discuss her diabetes. She has been diagnosed with endometrial cancer and is undergoing surgery in a few weeks. Saw Dr. Skeet Latch of gynecologic oncology yesterday, and Dr. Skeet Latch reached out to me to see if we can work hard to get Larya's sugars under control before her surgery, as it will improve her outcomes.  Nimah reports she's taking 30 units of lantus twice daily. Skipped doses two days ago because she was sick and not eating much. She took the lantus last night and this morning. Denies hypoglycemia at this time. Has appointment with me in 2 weeks but I offered to work with her over the phone every few days to increase her insulin regimen.  I stressed to Sheryle the importance of checking fasting sugars so that we can safely increase insulin. Asked her to check sugar in the morning and call me tomorrow so we can adjust insulin. She is agreeable to this plan.  Leeanne Rio, MD

## 2016-08-21 ENCOUNTER — Encounter (HOSPITAL_COMMUNITY): Payer: Self-pay | Admitting: *Deleted

## 2016-08-21 ENCOUNTER — Emergency Department (HOSPITAL_COMMUNITY)
Admission: EM | Admit: 2016-08-21 | Discharge: 2016-08-21 | Disposition: A | Discharge status: Home / Self Care | Payer: Self-pay | Attending: Emergency Medicine | Admitting: Emergency Medicine

## 2016-08-21 DIAGNOSIS — R1032 Left lower quadrant pain: Secondary | ICD-10-CM | POA: Insufficient documentation

## 2016-08-21 DIAGNOSIS — E119 Type 2 diabetes mellitus without complications: Secondary | ICD-10-CM | POA: Insufficient documentation

## 2016-08-21 DIAGNOSIS — F17210 Nicotine dependence, cigarettes, uncomplicated: Secondary | ICD-10-CM | POA: Insufficient documentation

## 2016-08-21 DIAGNOSIS — Z5321 Procedure and treatment not carried out due to patient leaving prior to being seen by health care provider: Secondary | ICD-10-CM | POA: Insufficient documentation

## 2016-08-21 LAB — CBC
HCT: 36.5 % (ref 36.0–46.0)
HEMOGLOBIN: 12.7 g/dL (ref 12.0–15.0)
MCH: 24.6 pg — ABNORMAL LOW (ref 26.0–34.0)
MCHC: 34.8 g/dL (ref 30.0–36.0)
MCV: 70.6 fL — AB (ref 78.0–100.0)
Platelets: 309 10*3/uL (ref 150–400)
RBC: 5.17 MIL/uL — AB (ref 3.87–5.11)
RDW: 16.1 % — AB (ref 11.5–15.5)
WBC: 10.9 10*3/uL — ABNORMAL HIGH (ref 4.0–10.5)

## 2016-08-21 LAB — COMPREHENSIVE METABOLIC PANEL
ALT: 14 U/L (ref 14–54)
ANION GAP: 6 (ref 5–15)
AST: 13 U/L — AB (ref 15–41)
Albumin: 3.9 g/dL (ref 3.5–5.0)
Alkaline Phosphatase: 110 U/L (ref 38–126)
BILIRUBIN TOTAL: 0.4 mg/dL (ref 0.3–1.2)
BUN: 12 mg/dL (ref 6–20)
CHLORIDE: 103 mmol/L (ref 101–111)
CO2: 27 mmol/L (ref 22–32)
Calcium: 9.5 mg/dL (ref 8.9–10.3)
Creatinine, Ser: 0.64 mg/dL (ref 0.44–1.00)
Glucose, Bld: 310 mg/dL — ABNORMAL HIGH (ref 65–99)
POTASSIUM: 3.9 mmol/L (ref 3.5–5.1)
Sodium: 136 mmol/L (ref 135–145)
TOTAL PROTEIN: 7.4 g/dL (ref 6.5–8.1)

## 2016-08-21 LAB — LIPASE, BLOOD: LIPASE: 34 U/L (ref 11–51)

## 2016-08-21 NOTE — ED Triage Notes (Signed)
Pt reports low back pain and LLQ pain x 3 days.  Pt denies any n/v/d at this time.  Pt reports pain in her side feels like pressure.  Pt is eating chips and drinking sprite during triage.

## 2016-08-26 ENCOUNTER — Encounter (HOSPITAL_COMMUNITY): Payer: Self-pay | Admitting: Family Medicine

## 2016-08-26 ENCOUNTER — Telehealth: Payer: Self-pay | Admitting: Gynecologic Oncology

## 2016-08-26 DIAGNOSIS — Z79899 Other long term (current) drug therapy: Secondary | ICD-10-CM | POA: Insufficient documentation

## 2016-08-26 DIAGNOSIS — Z794 Long term (current) use of insulin: Secondary | ICD-10-CM | POA: Insufficient documentation

## 2016-08-26 DIAGNOSIS — E119 Type 2 diabetes mellitus without complications: Secondary | ICD-10-CM | POA: Insufficient documentation

## 2016-08-26 DIAGNOSIS — K5720 Diverticulitis of large intestine with perforation and abscess without bleeding: Secondary | ICD-10-CM | POA: Insufficient documentation

## 2016-08-26 DIAGNOSIS — Z8541 Personal history of malignant neoplasm of cervix uteri: Secondary | ICD-10-CM | POA: Insufficient documentation

## 2016-08-26 DIAGNOSIS — F17210 Nicotine dependence, cigarettes, uncomplicated: Secondary | ICD-10-CM | POA: Insufficient documentation

## 2016-08-26 NOTE — ED Triage Notes (Signed)
Patient is complaining of lower abd pain that started about 30 minutes while she was sitting down. Also, experiencing nausea. Pt has took TYLENOL 2 tablets with no relief.

## 2016-08-26 NOTE — Telephone Encounter (Signed)
Returned call to patient.  She had left a message stating she had not heard from [facility] about her upcoming admission and surgery.  Advised her that I sent a message to Dr. Skeet Latch and her RN at [facility] and should be contacting her but to call our office if she does not hear anything.

## 2016-08-27 ENCOUNTER — Emergency Department (HOSPITAL_COMMUNITY): Payer: Self-pay

## 2016-08-27 ENCOUNTER — Emergency Department (HOSPITAL_COMMUNITY)
Admission: EM | Admit: 2016-08-27 | Discharge: 2016-08-27 | Disposition: A | Discharge status: Home / Self Care | Payer: Self-pay | Attending: Emergency Medicine | Admitting: Emergency Medicine

## 2016-08-27 DIAGNOSIS — K5720 Diverticulitis of large intestine with perforation and abscess without bleeding: Secondary | ICD-10-CM

## 2016-08-27 HISTORY — DX: Malignant neoplasm of uterus, part unspecified: C55

## 2016-08-27 HISTORY — DX: Malignant (primary) neoplasm, unspecified: C80.1

## 2016-08-27 LAB — URINALYSIS, ROUTINE W REFLEX MICROSCOPIC
Bacteria, UA: NONE SEEN
Bilirubin Urine: NEGATIVE
Ketones, ur: NEGATIVE mg/dL
Leukocytes, UA: NEGATIVE
NITRITE: NEGATIVE
PH: 5 (ref 5.0–8.0)
PROTEIN: NEGATIVE mg/dL
SPECIFIC GRAVITY, URINE: 1.027 (ref 1.005–1.030)

## 2016-08-27 LAB — COMPREHENSIVE METABOLIC PANEL
ALBUMIN: 3.5 g/dL (ref 3.5–5.0)
ALK PHOS: 98 U/L (ref 38–126)
ALT: 14 U/L (ref 14–54)
ANION GAP: 7 (ref 5–15)
AST: 13 U/L — ABNORMAL LOW (ref 15–41)
BILIRUBIN TOTAL: 0.5 mg/dL (ref 0.3–1.2)
BUN: 10 mg/dL (ref 6–20)
CALCIUM: 9.1 mg/dL (ref 8.9–10.3)
CO2: 28 mmol/L (ref 22–32)
CREATININE: 0.59 mg/dL (ref 0.44–1.00)
Chloride: 102 mmol/L (ref 101–111)
GFR calc non Af Amer: >60 mL/min (ref >60)
GLUCOSE: 220 mg/dL — AB (ref 65–99)
Potassium: 4 mmol/L (ref 3.5–5.1)
SODIUM: 137 mmol/L (ref 135–145)
TOTAL PROTEIN: 7.7 g/dL (ref 6.5–8.1)

## 2016-08-27 LAB — CBC
HCT: 34.3 % — ABNORMAL LOW (ref 36.0–46.0)
HEMOGLOBIN: 11.8 g/dL — AB (ref 12.0–15.0)
MCH: 24 pg — AB (ref 26.0–34.0)
MCHC: 34.4 g/dL (ref 30.0–36.0)
MCV: 69.9 fL — ABNORMAL LOW (ref 78.0–100.0)
PLATELETS: 360 10*3/uL (ref 150–400)
RBC: 4.91 MIL/uL (ref 3.87–5.11)
RDW: 16 % — AB (ref 11.5–15.5)
WBC: 8.2 10*3/uL (ref 4.0–10.5)

## 2016-08-27 LAB — LIPASE, BLOOD: Lipase: 24 U/L (ref 11–51)

## 2016-08-27 MED ORDER — METRONIDAZOLE 500 MG PO TABS
500.0000 mg | ORAL_TABLET | Freq: Once | ORAL | Status: AC
  Administered 2016-08-27: 500 mg via ORAL
  Filled 2016-08-27: qty 1

## 2016-08-27 MED ORDER — HYDROCODONE-ACETAMINOPHEN 5-325 MG PO TABS: 1.0000 | ORAL_TABLET | Freq: Four times a day (QID) | ORAL | 0 refills | Status: DC | PRN

## 2016-08-27 MED ORDER — METRONIDAZOLE 500 MG PO TABS: 500.0000 mg | ORAL_TABLET | Freq: Two times a day (BID) | ORAL | 0 refills | Status: DC

## 2016-08-27 MED ORDER — CIPROFLOXACIN HCL 500 MG PO TABS
500.0000 mg | ORAL_TABLET | Freq: Once | ORAL | Status: AC
  Administered 2016-08-27: 500 mg via ORAL
  Filled 2016-08-27: qty 1

## 2016-08-27 MED ORDER — ONDANSETRON HCL 4 MG PO TABS: 4.0000 mg | ORAL_TABLET | Freq: Three times a day (TID) | ORAL | 0 refills | Status: DC | PRN

## 2016-08-27 MED ORDER — CIPROFLOXACIN HCL 500 MG PO TABS: 500.0000 mg | ORAL_TABLET | Freq: Two times a day (BID) | ORAL | 0 refills | Status: DC

## 2016-08-27 NOTE — Discharge Instructions (Signed)
Take your antibiotics as directed Follow a clear liquid diet for the next 3 days  then you can advance your diet to a full liquid diet for 2 days and the eat easy to digest foods. Get help right away if: Your pain becomes worse. Your symptoms do not get better. Your symptoms suddenly get worse. You have a fever. You have repeated vomiting. You have bloody or black, tarry stools.

## 2016-08-27 NOTE — ED Notes (Signed)
Pt is aware urine sample is needed. 

## 2016-08-27 NOTE — ED Provider Notes (Signed)
[facility] DEPT Provider Note   CSN: [id number] Arrival date & time: 08/26/16  2339     History   Chief Complaint Chief Complaint  Patient presents with  . Abdominal Pain    HPI Kristy [name] is a 51 y.o. female with pmh of DM and recent dx of endometrial cancer who presents with cc of pelvic pain. The patient Complains of sudden onset pain in her pelvic region, worse on the left side. Patient states that it was severe and has subsided somewhat. This is second time she's had severe pain like this and came to the emergency department 2 days ago, but left after a long wait. She states that the pain is similar to previous kidney stone. She's had. Of note, she has large hemoglobin on her urine, which the patient states is from her "period." She denies fevers, chills, nausea, vomiting, urinary frequency, urgency, diarrhea or constipation. Patient denies flank pain.  HPI  Past Medical History:  Diagnosis Date  . Anemia   . Cancer ([place])   . Chronic back pain   . Diabetes mellitus type II   . Hyperlipidemia   . Kidney stones   . Uterine cancer [facility])     Patient Active Problem List   Diagnosis Date Noted  . Endometrial cancer ([place]) 07/28/2016  . Abnormal uterine bleeding 12/30/2015  . Chronic left shoulder pain 02/11/2015  . Nail abnormality 09/16/2014  . Left knee pain 06/23/2014  . Skin infection 12/07/2012  . Microcytic anemia 07/11/2012  . Paresthesia of left leg 07/08/2012  . Low back pain 10/20/2011  . Metabolic syndrome X 0000000  . Headache(784.0) 09/24/2011  . GERD (gastroesophageal reflux disease) 09/02/2011  . Chronic back pain 12/02/2010  . Hyperlipidemia 12/01/2010  . DM (diabetes mellitus), type 2, uncontrolled ([place]) 12/01/2010  . Obesity 12/01/2010    Past Surgical History:  Procedure Laterality Date  . CESAREAN SECTION    . KIDNEY STONE SURGERY    . TUBAL LIGATION      OB History    Gravida Para Term Preterm AB Living   2 2 2     2    SAB TAB  Ectopic Multiple Live Births                   Home Medications    Prior to Admission medications   Medication Sig Start Date End Date Taking? Authorizing Provider  acetaminophen (TYLENOL) 500 MG tablet Take 1,000 mg by mouth every 6 (six) hours as needed for pain.   Yes Historical Provider, MD  atorvastatin (LIPITOR) 80 MG tablet Take 1 tablet (80 mg total) by mouth daily. 07/09/16  Yes Leeanne Rio, MD  calcium carbonate (TUMS - DOSED IN MG ELEMENTAL CALCIUM) 500 MG chewable tablet Chew 2 tablets by mouth 2 (two) times daily as needed for indigestion or heartburn.   Yes Historical Provider, MD  insulin glargine (LANTUS) 100 unit/mL SOPN Inject 0.3 mLs (30 Units total) into the skin 2 (two) times daily. 07/24/16  Yes Leeanne Rio, MD  metFORMIN (GLUCOPHAGE-XR) 500 MG 24 hr tablet Take 1 tablet (500 mg total) by mouth 2 (two) times daily. 07/15/16  Yes Lind Covert, MD  Insulin Pen Needle (BD ULTRA-FINE PEN NEEDLES) 29G X 12.7MM MISC Check blood sugar 4x daily 03/28/14   Leeanne Rio, MD    Family History Family History  Problem Relation Age of Onset  . Adopted: Yes  . Alcohol abuse Neg Hx     Social History  Social History  Substance Use Topics  . Smoking status: Light Tobacco Smoker    Packs/day: 0.25    Types: Cigarettes  . Smokeless tobacco: Never Used     Comment: one cigarette daily  . Alcohol use 0.0 oz/week     Comment: Once a month     Allergies   Fish allergy and Oxycodone   Review of Systems Review of Systems   Ten systems reviewed and are negative for acute change, except as noted in the HPI.   Ten systems reviewed and are negative for acute change, except as noted in the HPI.  Physical Exam Updated Vital Signs BP 132/84 (BP Location: Right Arm)   Pulse 92   Temp 98.4 F (36.9 C) (Oral)   Resp 18   Ht 4\' 11"  (1.499 m)   Wt 80.3 kg   SpO2 98%   BMI 35.75 kg/m   Physical Exam  Constitutional: She is oriented to person,  place, and time. She appears well-developed and well-nourished. No distress.  Hirsut female in NAD  HENT:  Head: Normocephalic and atraumatic.  Eyes: Conjunctivae are normal. No scleral icterus.  Neck: Normal range of motion.  Cardiovascular: Normal rate, regular rhythm and normal heart sounds.  Exam reveals no gallop and no friction rub.   No murmur heard. Pulmonary/Chest: Effort normal and breath sounds normal. No respiratory distress.  Abdominal: Soft. Bowel sounds are normal. She exhibits no distension and no mass. There is tenderness (suprapubic region). There is no guarding.  Neurological: She is alert and oriented to person, place, and time.  Skin: Skin is warm and dry. She is not diaphoretic.  Nursing note and vitals reviewed.    ED Treatments / Results  Labs (all labs ordered are listed, but only abnormal results are displayed) Labs Reviewed  COMPREHENSIVE METABOLIC PANEL - Abnormal; Notable for the following:       Result Value   Glucose, Bld 220 (*)    AST 13 (*)    All other components within normal limits  CBC - Abnormal; Notable for the following:    Hemoglobin 11.8 (*)    HCT 34.3 (*)    MCV 69.9 (*)    MCH 24.0 (*)    RDW 16.0 (*)    All other components within normal limits  URINALYSIS, ROUTINE W REFLEX MICROSCOPIC - Abnormal; Notable for the following:    APPearance HAZY (*)    Glucose, UA >=500 (*)    Hgb urine dipstick LARGE (*)    Squamous Epithelial / LPF 0-5 (*)    All other components within normal limits  LIPASE, BLOOD    EKG  EKG Interpretation None       Radiology No results found.  Procedures Procedures (including critical care time)  Medications Ordered in ED Medications - No data to display   Initial Impression / Assessment and Plan / ED Course  I have reviewed the triage vital signs and the nursing notes.  Pertinent labs & imaging results that were available during my care of the patient were reviewed by me and considered in  my medical decision making (see chart for details).  Clinical Course as of Aug 31 1610  Thu Aug 27, 2016  0414 [place] reviewed, no drugs on the database  [AH]    Clinical Course User Index [AH] Margarita Mail, PA-C    Patient here with abdominal pain. CT scan shows diverticulitis. Do not feel that her pain is secondary to her diagnosis of uterine cancer.  Patient will be discharged today with Cipro and Flagyl. I have given her home instructions including clear liquid diet. We have discussed return precautions. She'll be discharged with pain medication and anti-emetics. Patient is follow-up with her primary care physician the next 2 days. She appears safe for discharge at this time without any active vomiting. Final Clinical Impressions(s) / ED Diagnoses   Final diagnoses:  Diverticulitis of large intestine with perforation without abscess or bleeding    New Prescriptions New Prescriptions   No medications on file     Margarita Mail, PA-C [id number]    Delora Fuel, MD [id number]

## 2016-09-01 ENCOUNTER — Encounter: Payer: Self-pay | Admitting: Family Medicine

## 2016-09-01 ENCOUNTER — Ambulatory Visit (INDEPENDENT_AMBULATORY_CARE_PROVIDER_SITE_OTHER): Payer: Self-pay | Admitting: Family Medicine

## 2016-09-01 VITALS — BP 122/78 | HR 86 | Temp 98.3°F | Ht 59.0 in | Wt 183.0 lb

## 2016-09-01 DIAGNOSIS — E118 Type 2 diabetes mellitus with unspecified complications: Secondary | ICD-10-CM

## 2016-09-01 DIAGNOSIS — E1165 Type 2 diabetes mellitus with hyperglycemia: Secondary | ICD-10-CM

## 2016-09-01 DIAGNOSIS — IMO0002 Reserved for concepts with insufficient information to code with codable children: Secondary | ICD-10-CM

## 2016-09-01 DIAGNOSIS — Z720 Tobacco use: Secondary | ICD-10-CM

## 2016-09-01 DIAGNOSIS — Z794 Long term (current) use of insulin: Secondary | ICD-10-CM

## 2016-09-01 MED ORDER — INSULIN GLARGINE 100 UNITS/ML SOLOSTAR PEN: 33.0000 [IU] | PEN_INJECTOR | Freq: Two times a day (BID) | SUBCUTANEOUS | Status: DC

## 2016-09-01 NOTE — Patient Instructions (Signed)
Go up to 33 units twice daily on your lantus Call me in 2 days to see how your sugars are doing We will probably go up on the dose at that time  Kristy [name] the course of antibiotics.  Be well, Dr. Ardelia Mems

## 2016-09-01 NOTE — Progress Notes (Signed)
Date of Visit: 09/01/2016   HPI:  Patient presents for follow up of her diabetes in preparation of upcoming surgery. Has been diagnosed with endometrial cancer, and is scheduled to have a robotic hysterectomy done at [facility] next week. Her GYN/ONC surgeon Dr. Skeet Latch has asked that we try to get sugars under as much control as possible prior to surgery (they are even admitting her 2 days prior to get CBG's better controlled).  Currently taking lantus 30 units twice daily. Checks sugars in the morning - highest she's gotten is 233, lowest is 172. Denies any symptoms of low blood sugars.  Also, was recently seen in ED for abdominal pain, found to have diverticulitis. Discharged from there on cipro and flagyl. She is taking these medications and feels much better. No fevers. Still some low appetite but eating and drinking well overall. Abdominal pain is improved.  Smoking - discussed smoking history with her. She smokes 1 cigarette per week. She believes she can stop this.  ROS: See HPI.  [place]: history of hyperlipidemia, diabetes, obesity, GERD, endometrial cancer  PHYSICAL EXAM: BP 122/78   Pulse 86   Temp 98.3 F (36.8 C) (Oral)   Ht 4\' 11"  (1.499 m)   Wt 183 lb (83 kg)   BMI 36.96 kg/m  Gen: NAD, pleasant, cooperative HEENT: normocephalic, atraumatic, moist mucous membranes  Abdomen: soft, nontender to palpation. Normoactive bowel sounds. No LLQ tenderness.  ASSESSMENT/PLAN:  Health maintenance:  -declined flu shot -hold on collecting urine microalbumin today as recent UA showed lots of blood secondary to her endometrial cancer - will plan to collect this after her surgery when she is better, for more accurate assessment of urine protein -address other health maintenance items at future visit - briefly reviewed what she's due for with patient today (mammo, colon, eye, hep C)  Diverticulitis Improving. Complete course of antibiotics. Will message GYN surgeon to make sure she knows  about diverticulitis episode in case it impacts surgery plans.  Tobacco abuse Currently just smoking 1 cigarette per week. Strongly encouraged her to quit this - she is motivated and believes she can stop.  DM (diabetes mellitus), type 2, uncontrolled Uncontrolled fasting sugars. Increase lantus to 33 units twice daily. Patient instructed to continue checking CBG's and call me in two days to further adjust her insulin.  FOLLOW UP: Follow up in 2 days by phone for diabetes, then see me after her surgery.  [place]. Ardelia Mems, [place]

## 2016-09-04 ENCOUNTER — Telehealth: Payer: Self-pay | Admitting: Family Medicine

## 2016-09-04 ENCOUNTER — Telehealth: Payer: Self-pay

## 2016-09-04 NOTE — Telephone Encounter (Signed)
-----   Message from Leeanne Rio, MD sent at 09/04/2016 10:53 AM EST ----- Hey Dr. Skeet Latch,  I just wanted to bring to your attention that Kristy [name] was recently diagnosed with diverticulitis in an ED visit. I saw her earlier this week and she was improving on cipro/flagyl. I'm not sure if this impacts your surgery plans, but just a heads up.  [place]

## 2016-09-04 NOTE — Assessment & Plan Note (Signed)
Uncontrolled fasting sugars. Increase lantus to 33 units twice daily. Patient instructed to continue checking CBG's and call me in two days to further adjust her insulin.

## 2016-09-04 NOTE — Telephone Encounter (Signed)
Called patient to discuss her sugars (she never called me yesterday as instructed). Sugar yesterday morning was 180. No low sugars or symptoms of hypoglycemia. Has not checked it yet today. Currently taking lantus 33 units twice daily. Will increase to 36 units twice daily.   Also advised patient that it is important her surgeon know about diverticulitis episode, in case it impacts their plans for surgery. Advised she tell them when she goes for surgery about this episode.  I also messaged Dr. Skeet Latch in Epi, but since I am not sure she will see it before next week, I called the Gyn/Onc office and spoke with the clinic nurse, who is going to make sure Dr. Skeet Latch hears about this episode, in case it impacts surgery plans.  Kristy Rio, MD

## 2016-09-04 NOTE — Telephone Encounter (Signed)
Dr Chrisandra Netters, pts PCP, sent message via in basket to update Dr Skeet Latch that pt has a new diagnosis of diverticulitis and is currently being treated. Pt is scheduled for surgery next week at [facility]  With Dr Skeet Latch. Information from Dr Ardelia Mems was faxed to [facility] at [facility] to forward to Dr Skeet Latch.

## 2016-09-04 NOTE — Assessment & Plan Note (Signed)
Currently just smoking 1 cigarette per week. Strongly encouraged her to quit this - she is motivated and believes she can stop.

## 2016-09-28 ENCOUNTER — Ambulatory Visit: Payer: Self-pay | Admitting: Family Medicine

## 2016-10-14 ENCOUNTER — Emergency Department (HOSPITAL_COMMUNITY)
Admission: EM | Admit: 2016-10-14 | Discharge: 2016-10-14 | Disposition: A | Discharge status: Home / Self Care | Payer: Self-pay | Attending: Emergency Medicine | Admitting: Emergency Medicine

## 2016-10-14 ENCOUNTER — Encounter (HOSPITAL_COMMUNITY): Payer: Self-pay | Admitting: Emergency Medicine

## 2016-10-14 DIAGNOSIS — J029 Acute pharyngitis, unspecified: Secondary | ICD-10-CM | POA: Insufficient documentation

## 2016-10-14 DIAGNOSIS — R6889 Other general symptoms and signs: Secondary | ICD-10-CM

## 2016-10-14 DIAGNOSIS — E119 Type 2 diabetes mellitus without complications: Secondary | ICD-10-CM | POA: Insufficient documentation

## 2016-10-14 DIAGNOSIS — N3000 Acute cystitis without hematuria: Secondary | ICD-10-CM | POA: Insufficient documentation

## 2016-10-14 DIAGNOSIS — R05 Cough: Secondary | ICD-10-CM | POA: Insufficient documentation

## 2016-10-14 DIAGNOSIS — R52 Pain, unspecified: Secondary | ICD-10-CM | POA: Insufficient documentation

## 2016-10-14 DIAGNOSIS — Z8542 Personal history of malignant neoplasm of other parts of uterus: Secondary | ICD-10-CM | POA: Insufficient documentation

## 2016-10-14 DIAGNOSIS — F17210 Nicotine dependence, cigarettes, uncomplicated: Secondary | ICD-10-CM | POA: Insufficient documentation

## 2016-10-14 LAB — CBC WITH DIFFERENTIAL/PLATELET
Basophils Absolute: 0 K/uL (ref 0.0–0.1)
Basophils Relative: 0 %
Eosinophils Absolute: 0.2 K/uL (ref 0.0–0.7)
Eosinophils Relative: 3 %
HCT: 32 % — ABNORMAL LOW (ref 36.0–46.0)
Hemoglobin: 11.2 g/dL — ABNORMAL LOW (ref 12.0–15.0)
Lymphocytes Relative: 29 %
Lymphs Abs: 1.8 K/uL (ref 0.7–4.0)
MCH: 23.7 pg — ABNORMAL LOW (ref 26.0–34.0)
MCHC: 35 g/dL (ref 30.0–36.0)
MCV: 67.8 fL — ABNORMAL LOW (ref 78.0–100.0)
Monocytes Absolute: 0.4 K/uL (ref 0.1–1.0)
Monocytes Relative: 6 %
Neutro Abs: 3.8 K/uL (ref 1.7–7.7)
Neutrophils Relative %: 62 %
Platelets: 334 K/uL (ref 150–400)
RBC: 4.72 MIL/uL (ref 3.87–5.11)
RDW: 15.4 % (ref 11.5–15.5)
WBC: 6.2 K/uL (ref 4.0–10.5)

## 2016-10-14 LAB — COMPREHENSIVE METABOLIC PANEL WITH GFR
ALT: 11 U/L — ABNORMAL LOW (ref 14–54)
AST: 13 U/L — ABNORMAL LOW (ref 15–41)
Albumin: 3.6 g/dL (ref 3.5–5.0)
Alkaline Phosphatase: 72 U/L (ref 38–126)
Anion gap: 5 (ref 5–15)
BUN: 15 mg/dL (ref 6–20)
CO2: 27 mmol/L (ref 22–32)
Calcium: 9.4 mg/dL (ref 8.9–10.3)
Chloride: 108 mmol/L (ref 101–111)
Creatinine, Ser: 0.75 mg/dL (ref 0.44–1.00)
GFR calc Af Amer: >60 mL/min
GFR calc non Af Amer: >60 mL/min
Glucose, Bld: 177 mg/dL — ABNORMAL HIGH (ref 65–99)
Potassium: 4.1 mmol/L (ref 3.5–5.1)
Sodium: 140 mmol/L (ref 135–145)
Total Bilirubin: 0.6 mg/dL (ref 0.3–1.2)
Total Protein: 7.6 g/dL (ref 6.5–8.1)

## 2016-10-14 LAB — URINALYSIS, ROUTINE W REFLEX MICROSCOPIC
Bilirubin Urine: NEGATIVE
Glucose, UA: 50 mg/dL — AB
Hgb urine dipstick: NEGATIVE
Ketones, ur: NEGATIVE mg/dL
Nitrite: NEGATIVE
Protein, ur: NEGATIVE mg/dL
Specific Gravity, Urine: 1.021 (ref 1.005–1.030)
pH: 6 (ref 5.0–8.0)

## 2016-10-14 LAB — LIPASE, BLOOD: Lipase: 20 U/L (ref 11–51)

## 2016-10-14 MED ORDER — CEPHALEXIN 500 MG PO CAPS: 500.0000 mg | ORAL_CAPSULE | Freq: Two times a day (BID) | ORAL | 0 refills | Status: AC

## 2016-10-14 MED ORDER — IBUPROFEN 800 MG PO TABS: 800.0000 mg | ORAL_TABLET | Freq: Three times a day (TID) | ORAL | 0 refills | Status: DC | PRN

## 2016-10-14 MED ORDER — IBUPROFEN 800 MG PO TABS
800.0000 mg | ORAL_TABLET | Freq: Once | ORAL | Status: AC
  Administered 2016-10-14: 800 mg via ORAL
  Filled 2016-10-14: qty 1

## 2016-10-14 NOTE — Discharge Instructions (Signed)

## 2016-10-14 NOTE — ED Triage Notes (Signed)
Pt had surgery on February 21st for uterine cancer.  She comes today with complaints of pain to the surgical incision at her belly button. Scar from incision noted with no erythema, redness, swelling, or discharge.  Also endorses generalized body aches.

## 2016-10-14 NOTE — ED Provider Notes (Signed)
Emergency Department Provider Note   I have reviewed the triage vital signs and the nursing notes.   HISTORY  Chief Complaint Generalized Body Aches and Pain at Surgical Site   HPI Kristy [name] is a 51 y.o. female presents to the ED for evaluation of body aches for the last week. She had a recent resection of adenocarcinoma at [facility] with relatively unremarkable post-operative course. She reports one week of total body pain with especially bad pain in the RLE. No history of blood clots. No swelling or redness in the leg. She has had some associated mild HA, sore throat, and mild cough. Denies fever, chills, or dysuria. Pain is moderate and total body. No radiation of symptoms.   She is also complaining of some mild periumbilical pain. No modifying factors or radiation. No drainage or redness from trocar sites.   Past Medical History:  Diagnosis Date  . Anemia   . Cancer ([place])   . Chronic back pain   . Diabetes mellitus type II   . Hyperlipidemia   . Kidney stones   . Uterine cancer [facility])     Patient Active Problem List   Diagnosis Date Noted  . Endometrial cancer ([place]) 07/28/2016  . Abnormal uterine bleeding 12/30/2015  . Chronic left shoulder pain 02/11/2015  . Nail abnormality 09/16/2014  . Left knee pain 06/23/2014  . Skin infection 12/07/2012  . Microcytic anemia 07/11/2012  . Paresthesia of left leg 07/08/2012  . Tobacco abuse 03/16/2012  . Low back pain 10/20/2011  . Metabolic syndrome X 88/41/6606  . Headache(784.0) 09/24/2011  . GERD (gastroesophageal reflux disease) 09/02/2011  . Chronic back pain 12/02/2010  . Hyperlipidemia 12/01/2010  . DM (diabetes mellitus), type 2, uncontrolled ([place]) 12/01/2010  . Obesity 12/01/2010    Past Surgical History:  Procedure Laterality Date  . CESAREAN SECTION    . KIDNEY STONE SURGERY    . TUBAL LIGATION      Current Outpatient Rx  . Order #: [id number] Class: Historical Med  . Order #: [id number] Class: Fax  . Order #:  [id number] Class: Historical Med  . Order #: [id number] Class: No Print  . Order #: [id number] Class: Historical Med  . Order #: [id number] Class: Fax  . Order #: [id number] Class: Print  . Order #: [id number] Class: Print  . Order #: [id number] Class: Print  . Order #: [id number] Class: Print  . Order #: [id number] Class: Print    Allergies Fish allergy and Oxycodone  Family History  Problem Relation Age of Onset  . Adopted: Yes  . Alcohol abuse Neg Hx     Social History Social History  Substance Use Topics  . Smoking status: Light Tobacco Smoker    Packs/day: 0.25    Types: Cigarettes  . Smokeless tobacco: Never Used     Comment: one cigarette daily  . Alcohol use 0.0 oz/week     Comment: Once a month    Review of Systems  Constitutional: No fever/chills. Positive body aches.  Eyes: No visual changes. ENT: Positive mild sore throat. Cardiovascular: Denies chest pain. Respiratory: Denies shortness of breath. Gastrointestinal: Positive periumbilical abdominal pain.  No nausea, no vomiting.  No diarrhea.  No constipation. Genitourinary: Negative for dysuria. Musculoskeletal: Negative for back pain. Positive RLE cramping pain.  Skin: Negative for rash. Neurological: Negative for focal weakness or numbness. Positive mild HA.   10-point ROS otherwise negative.  ____________________________________________   PHYSICAL EXAM:  VITAL SIGNS: ED Triage Vitals  Enc Vitals Group     BP 10/14/16 2037 111/84  Pulse Rate 10/14/16 2037 85     Resp 10/14/16 2037 18     Temp 10/14/16 2037 98.2 F (36.8 C)     Temp Source 10/14/16 2037 Oral     SpO2 10/14/16 2037 98 %     Weight 10/14/16 2038 181 lb (82.1 kg)     Height 10/14/16 2038 4\' 11"  (1.499 m)     Pain Score 10/14/16 2046 8   Constitutional: Alert and oriented. Well appearing and in no acute distress. Eyes: Conjunctivae are normal.  Head: Atraumatic. Nose: No congestion/rhinnorhea. Mouth/Throat: Mucous membranes are moist.   Oropharynx non-erythematous. Neck: No stridor.   Cardiovascular: Normal rate, regular rhythm. Good peripheral circulation. Grossly normal heart sounds.   Respiratory: Normal respiratory effort.  No retractions. Lungs CTAB. Gastrointestinal: Soft and non-tender to palpation throughout. Incisions are clean, dry, and intact. No distention.  Musculoskeletal: No lower extremity tenderness nor edema. No gross deformities of extremities. No unilateral leg swelling or erythema.  Neurologic:  Normal speech and language. No gross focal neurologic deficits are appreciated.  Skin:  Skin is warm, dry and intact. No rash noted. Psychiatric: Mood and affect are normal. Speech and behavior are normal.  ____________________________________________   LABS (all labs ordered are listed, but only abnormal results are displayed)  Labs Reviewed  COMPREHENSIVE METABOLIC PANEL - Abnormal; Notable for the following:       Result Value   Glucose, Bld 177 (*)    AST 13 (*)    ALT 11 (*)    All other components within normal limits  CBC WITH DIFFERENTIAL/PLATELET - Abnormal; Notable for the following:    Hemoglobin 11.2 (*)    HCT 32.0 (*)    MCV 67.8 (*)    MCH 23.7 (*)    All other components within normal limits  URINALYSIS, ROUTINE W REFLEX MICROSCOPIC - Abnormal; Notable for the following:    Glucose, UA 50 (*)    Leukocytes, UA LARGE (*)    Bacteria, UA RARE (*)    Squamous Epithelial / LPF 0-5 (*)    All other components within normal limits  LIPASE, BLOOD   ____________________________________________  RADIOLOGY  None ____________________________________________   PROCEDURES  Procedure(s) performed:   Procedures  None ____________________________________________   INITIAL IMPRESSION / ASSESSMENT AND PLAN / ED COURSE  Pertinent labs & imaging results that were available during my care of the patient were reviewed by me and considered in my medical decision making (see chart for  details).  Patient presents to the ED for evaluation of body aches and mild periumbilical abdominal pain. No tenderness to palpation. Surgical sites are well-appearing. Body aches and URI symptoms most consistent with URI/viral infection. Doubt flu but with 1 week of symptoms would not prescribe Tamiflu. Abdomen is soft and non-tender. No indication for CT. No findings to suggest post-op complication. Plan for labs and UA to assess renal function, electrolyte levels, and UA.   10:15 PM Patient labs are largely unremarkable. Large Leuk esterase and rare bacteria. Will cover for UTI with mild abdominal discomfort and body aches. No evidence of pyelonephritis. Discussed symptoms mgmt and home. Discussed rare, brief Motrin use for body aches.   At this time, I do not feel there is any life-threatening condition present. I have reviewed and discussed all results (EKG, imaging, lab, urine as appropriate), exam findings with patient. I have reviewed nursing notes and appropriate previous records.  I feel the patient is safe to be discharged home without further emergent workup.  Discussed usual and customary return precautions. Patient and family (if present) verbalize understanding and are comfortable with this plan.  Patient will follow-up with their primary care provider. If they do not have a primary care provider, information for follow-up has been provided to them. All questions have been answered.  ____________________________________________  FINAL CLINICAL IMPRESSION(S) / ED DIAGNOSES  Final diagnoses:  Flu-like symptoms  Acute cystitis without hematuria     MEDICATIONS GIVEN DURING THIS VISIT:  Medications  ibuprofen (ADVIL,MOTRIN) tablet 800 mg (800 mg Oral Given 10/14/16 2220)     NEW OUTPATIENT MEDICATIONS STARTED DURING THIS VISIT:  Discharge Medication List as of 10/14/2016 11:08 PM    START taking these medications   Details  cephALEXin (KEFLEX) 500 MG capsule Take 1 capsule  (500 mg total) by mouth 2 (two) times daily., Starting Wed 10/14/2016, Until Wed 10/21/2016, Print    ibuprofen (ADVIL,MOTRIN) 800 MG tablet Take 1 tablet (800 mg total) by mouth every 8 (eight) hours as needed., Starting Wed 10/14/2016, Print          Note:  This document was prepared using Dragon voice recognition software and may include unintentional dictation errors.  Nanda Quinton, MD Emergency Medicine   Margette Fast, MD 10/15/16 [phone]

## 2016-10-27 ENCOUNTER — Ambulatory Visit (INDEPENDENT_AMBULATORY_CARE_PROVIDER_SITE_OTHER): Payer: Self-pay | Admitting: Family Medicine

## 2016-10-27 ENCOUNTER — Encounter: Payer: Self-pay | Admitting: Family Medicine

## 2016-10-27 VITALS — BP 106/74 | HR 78 | Temp 98.6°F | Ht 59.0 in | Wt 185.0 lb

## 2016-10-27 DIAGNOSIS — E1110 Type 2 diabetes mellitus with ketoacidosis without coma: Secondary | ICD-10-CM

## 2016-10-27 DIAGNOSIS — F329 Major depressive disorder, single episode, unspecified: Secondary | ICD-10-CM

## 2016-10-27 DIAGNOSIS — E1310 Other specified diabetes mellitus with ketoacidosis without coma: Secondary | ICD-10-CM

## 2016-10-27 DIAGNOSIS — R4589 Other symptoms and signs involving emotional state: Secondary | ICD-10-CM

## 2016-10-27 DIAGNOSIS — Z794 Long term (current) use of insulin: Secondary | ICD-10-CM

## 2016-10-27 LAB — POCT GLYCOSYLATED HEMOGLOBIN (HGB A1C): HEMOGLOBIN A1C: 8.2

## 2016-10-27 NOTE — Patient Instructions (Signed)
Diabetes is WAY better. This is awesome. Continue current medicines  Call Dr. Leone Brand office at [facility] to schedule a follow up visit. [address] [place], [place] [address] [address]: [place] your eyes examined Try to stay active - this will help your pain over time  No more than 6 pills of tylenol per day (the 650mg  pills)  See me in 3 months  Be well, Dr. Ardelia Mems

## 2016-10-27 NOTE — Progress Notes (Signed)
Date of Visit: 10/27/2016   HPI:  Patient presents for routine follow up  Diabetes - currently taking lantus 33 units twice daily as well as humalog/lispro 10 units twice daily with two biggest meals. Doing well on this regimen. A1c today 8.2 - the best it's been in a while. Lowest sugar she's gotten is 70. Knows to eat something sweet when that happens. This happens maybe 2x/week. Fasting sugars tend to be more in the 100s up to 180s. Nothing less than 70. Not waking up hypoglycemic. Due for eye exam, states she will get one scheduled. Also due for urine microalbumin but states she cannot give urine sample today.  Post operative pain - had surgery 5-6 weeks ago at [facility] (robotic hysterectomy) for endometrial cancer. Having lots of pain after surgery. Taking up to 7 tabs of tylenol (650mg  tabs) per day to help with pain. Also doing ibuprofen 800mg  here and there (was prescribed this by ED physician). Seen in ED and treated for UTI with keflex. Patient reports she never had dysuria or fevers, but thinks keflex may have helped some. Tylenol does help when she takes it. Had preexisting back/leg pain prior to surgery. Denies having fever, saddle anesthesia, lower extremity weakness, or problems with stooling or urination. Takes stool softener or prune juice to help with bowel movements. Has not seen Dr. Skeet Latch (gyn/onc surgeon) for follow up visit as she does not have transportation to [facility]. Has not called to see whether she could be seen in the [facility] office.  ROS: See HPI.  [place]: history of hyperlipidemia, diabetes, obesity, chronic back pain, GERD  PHYSICAL EXAM: BP 106/74 (BP Location: Left Arm, Patient Position: Sitting, Cuff Size: Large)   Pulse 78   Temp 98.6 F (37 C) (Oral)   Ht 4\' 11"  (1.499 m)   Wt 185 lb (83.9 kg)   SpO2 99%   BMI 37.37 kg/m  Gen: no acute distress, pleasant, cooperative HEENT: normocephalic, atraumatic, moist mucous membranes  Heart: regular rate and rhythm,  no murmur Lungs: clear to auscultation bilaterally normal work of breathing  Neuro: alert, grossly nonfocal, speech normal Ext: No appreciable lower extremity edema bilaterally. Full strength bilateral lower extremities. Sensation intact over bilateral lower extremities   ASSESSMENT/PLAN:  Health maintenance:  -reminded of need for hep C, mammogram, colonoscopy, eye exam - patient wants to wait until she sees if she qualifies for financial assistance (received that info today) -attempted urine micro this visit but patient stated she could not give a urine sample today  DM (diabetes mellitus), type 2, uncontrolled A1c much improved on current regimen No true hypoglycemia, patient knows what to do if sugar drops Continue current regimen for now.  Cardiac: on statin, aspirin Renal: attempted to collect urine microalbumin but patient could not give sample today Eye: reminded to schedule visit Foot: UTD Immunizations: UTD   Depressed mood Patient had PHQ-2 during this visit which was elevated, so she was given a PHQ-9, which showed score of 15 (with question #9 score 0). Due to talking about her other issues, we did not discuss the PHQ-9 during the visit but I called her the day following her visit (10/28/16) to touch base on her symptoms. Reports mood is up and down but overall ok. When asked if she feels depressed she says yes, that she's felt that way for a while even before her surgery. Denies thoughts of harming herself or others. She is agreeable to scheduling a visit with me to discuss depression further.  She will call the clinic to schedule.  Postoperative discomfort Seems to be more musculoskeletal discomfort. Abdomen is benign, patient is afebrile & tolerating PO. No back pain red flags. Counseled to decrease tylenol intake to safer level and increase physical activity to promote conditioning. Has not yet had follow up visit due to transportation issues, so I have given her the contact  info for the [place] center, as perhaps Dr. Skeet Latch could see her there for follow up.   FOLLOW UP: Follow up in 3 months with me for diabetes   [place] J. Ardelia Mems, [place]

## 2016-10-28 ENCOUNTER — Telehealth: Payer: Self-pay | Admitting: *Deleted

## 2016-10-28 DIAGNOSIS — R4589 Other symptoms and signs involving emotional state: Secondary | ICD-10-CM | POA: Insufficient documentation

## 2016-10-28 DIAGNOSIS — F329 Major depressive disorder, single episode, unspecified: Secondary | ICD-10-CM

## 2016-10-28 NOTE — Assessment & Plan Note (Signed)
A1c much improved on current regimen No true hypoglycemia, patient knows what to do if sugar drops Continue current regimen for now.  Cardiac: on statin, aspirin Renal: attempted to collect urine microalbumin but patient could not give sample today Eye: reminded to schedule visit Foot: UTD Immunizations: UTD

## 2016-10-28 NOTE — Assessment & Plan Note (Addendum)
Patient had PHQ-2 during this visit which was elevated, so she was given a PHQ-9, which showed score of 15 (with question #9 score 0). Due to talking about her other issues, we did not discuss the PHQ-9 during the visit but I called her the day following her visit (10/28/16) to touch base on her symptoms. Reports mood is up and down but overall ok. When asked if she feels depressed she says yes, that she's felt that way for a while even before her surgery. Denies thoughts of harming herself or others. She is agreeable to scheduling a visit with me to discuss depression further. She will call the clinic to schedule.

## 2016-10-28 NOTE — Telephone Encounter (Signed)
Patient called and scheduled her follow up appt for May. Patient aware

## 2016-11-01 ENCOUNTER — Encounter (HOSPITAL_COMMUNITY): Payer: Self-pay

## 2016-11-01 DIAGNOSIS — J101 Influenza due to other identified influenza virus with other respiratory manifestations: Secondary | ICD-10-CM | POA: Insufficient documentation

## 2016-11-01 DIAGNOSIS — Z7984 Long term (current) use of oral hypoglycemic drugs: Secondary | ICD-10-CM | POA: Insufficient documentation

## 2016-11-01 DIAGNOSIS — E119 Type 2 diabetes mellitus without complications: Secondary | ICD-10-CM | POA: Insufficient documentation

## 2016-11-01 DIAGNOSIS — Z8542 Personal history of malignant neoplasm of other parts of uterus: Secondary | ICD-10-CM | POA: Insufficient documentation

## 2016-11-01 DIAGNOSIS — F17210 Nicotine dependence, cigarettes, uncomplicated: Secondary | ICD-10-CM | POA: Insufficient documentation

## 2016-11-01 DIAGNOSIS — Z794 Long term (current) use of insulin: Secondary | ICD-10-CM | POA: Insufficient documentation

## 2016-11-01 DIAGNOSIS — R0981 Nasal congestion: Secondary | ICD-10-CM | POA: Insufficient documentation

## 2016-11-01 DIAGNOSIS — R112 Nausea with vomiting, unspecified: Secondary | ICD-10-CM | POA: Insufficient documentation

## 2016-11-01 DIAGNOSIS — Z859 Personal history of malignant neoplasm, unspecified: Secondary | ICD-10-CM | POA: Insufficient documentation

## 2016-11-01 LAB — COMPREHENSIVE METABOLIC PANEL
ALBUMIN: 4 g/dL (ref 3.5–5.0)
ALK PHOS: 73 U/L (ref 38–126)
ALT: 17 U/L (ref 14–54)
AST: 22 U/L (ref 15–41)
Anion gap: 8 (ref 5–15)
BILIRUBIN TOTAL: 0.8 mg/dL (ref 0.3–1.2)
BUN: 14 mg/dL (ref 6–20)
CALCIUM: 9.6 mg/dL (ref 8.9–10.3)
CHLORIDE: 103 mmol/L (ref 101–111)
CO2: 26 mmol/L (ref 22–32)
Creatinine, Ser: 0.73 mg/dL (ref 0.44–1.00)
GFR calc Af Amer: >60 mL/min (ref >60)
Glucose, Bld: 189 mg/dL — ABNORMAL HIGH (ref 65–99)
POTASSIUM: 3.8 mmol/L (ref 3.5–5.1)
Sodium: 137 mmol/L (ref 135–145)
Total Protein: 8.5 g/dL — ABNORMAL HIGH (ref 6.5–8.1)

## 2016-11-01 LAB — CBC
HEMATOCRIT: 34.2 % — AB (ref 36.0–46.0)
HEMOGLOBIN: 12 g/dL (ref 12.0–15.0)
MCH: 24.1 pg — AB (ref 26.0–34.0)
MCHC: 35.1 g/dL (ref 30.0–36.0)
MCV: 68.8 fL — ABNORMAL LOW (ref 78.0–100.0)
Platelets: 308 10*3/uL (ref 150–400)
RBC: 4.97 MIL/uL (ref 3.87–5.11)
RDW: 16.2 % — ABNORMAL HIGH (ref 11.5–15.5)
WBC: 5.8 10*3/uL (ref 4.0–10.5)

## 2016-11-01 LAB — LIPASE, BLOOD: Lipase: 21 U/L (ref 11–51)

## 2016-11-01 MED ORDER — ACETAMINOPHEN 325 MG PO TABS
650.0000 mg | ORAL_TABLET | Freq: Once | ORAL | Status: AC | PRN
  Administered 2016-11-01: 650 mg via ORAL
  Filled 2016-11-01: qty 2

## 2016-11-01 NOTE — ED Triage Notes (Signed)
States since Friday with nausea and vomiting with fever and dysuria voiced unable to keep anything down and abdominal pain all over.  States has body aches all over her body.

## 2016-11-02 ENCOUNTER — Emergency Department (HOSPITAL_COMMUNITY): Payer: Self-pay

## 2016-11-02 ENCOUNTER — Emergency Department (HOSPITAL_COMMUNITY)
Admission: EM | Admit: 2016-11-02 | Discharge: 2016-11-02 | Disposition: A | Discharge status: Home / Self Care | Payer: Self-pay | Attending: Emergency Medicine | Admitting: Emergency Medicine

## 2016-11-02 ENCOUNTER — Other Ambulatory Visit: Payer: Self-pay

## 2016-11-02 DIAGNOSIS — J101 Influenza due to other identified influenza virus with other respiratory manifestations: Secondary | ICD-10-CM

## 2016-11-02 DIAGNOSIS — R112 Nausea with vomiting, unspecified: Secondary | ICD-10-CM

## 2016-11-02 DIAGNOSIS — R05 Cough: Secondary | ICD-10-CM

## 2016-11-02 DIAGNOSIS — R059 Cough, unspecified: Secondary | ICD-10-CM

## 2016-11-02 DIAGNOSIS — R0981 Nasal congestion: Secondary | ICD-10-CM

## 2016-11-02 LAB — URINALYSIS, ROUTINE W REFLEX MICROSCOPIC
Bilirubin Urine: NEGATIVE
Glucose, UA: NEGATIVE mg/dL
Hgb urine dipstick: NEGATIVE
Ketones, ur: 5 mg/dL — AB
LEUKOCYTES UA: NEGATIVE
NITRITE: NEGATIVE
PH: 5 (ref 5.0–8.0)
Protein, ur: NEGATIVE mg/dL
SPECIFIC GRAVITY, URINE: 1.025 (ref 1.005–1.030)

## 2016-11-02 LAB — INFLUENZA PANEL BY PCR (TYPE A & B)
INFLBPCR: POSITIVE — AB
Influenza A By PCR: NEGATIVE

## 2016-11-02 LAB — I-STAT CG4 LACTIC ACID, ED: LACTIC ACID, VENOUS: 0.84 mmol/L (ref 0.5–1.9)

## 2016-11-02 MED ORDER — BENZONATATE 100 MG PO CAPS: 100.0000 mg | ORAL_CAPSULE | Freq: Three times a day (TID) | ORAL | 0 refills | Status: DC | PRN

## 2016-11-02 MED ORDER — ALBUTEROL SULFATE HFA 108 (90 BASE) MCG/ACT IN AERS: 2.0000 | INHALATION_SPRAY | RESPIRATORY_TRACT | 3 refills | Status: DC | PRN

## 2016-11-02 MED ORDER — FLUTICASONE PROPIONATE 50 MCG/ACT NA SUSP: 2.0000 | Freq: Every day | NASAL | 2 refills | Status: DC

## 2016-11-02 MED ORDER — BENZONATATE 100 MG PO CAPS
200.0000 mg | ORAL_CAPSULE | Freq: Once | ORAL | Status: AC
  Administered 2016-11-02: 200 mg via ORAL
  Filled 2016-11-02: qty 2

## 2016-11-02 MED ORDER — SODIUM CHLORIDE 0.9 % IV BOLUS (SEPSIS)
1000.0000 mL | INTRAVENOUS | Status: AC
  Administered 2016-11-02: 1000 mL via INTRAVENOUS

## 2016-11-02 MED ORDER — IBUPROFEN 200 MG PO TABS
400.0000 mg | ORAL_TABLET | Freq: Once | ORAL | Status: AC
  Administered 2016-11-02: 400 mg via ORAL
  Filled 2016-11-02: qty 2

## 2016-11-02 MED ORDER — OSELTAMIVIR PHOSPHATE 75 MG PO CAPS: 75.0000 mg | ORAL_CAPSULE | Freq: Two times a day (BID) | ORAL | 0 refills | Status: DC

## 2016-11-02 NOTE — ED Provider Notes (Signed)
[facility] DEPT Provider Note   CSN: [id number] Arrival date & time: 11/01/16  2039   By signing my name below, I, Eunice Blase, attest that this documentation has been prepared under the direction and in the presence of [employer], PA-C. Electronically Signed: Eunice Blase, Scribe. 11/02/16. 2:06 AM.  History   Chief Complaint Chief Complaint  Patient presents with  . Abdominal Pain   The history is provided by the patient and medical records. No language interpreter was used.    HPI Comments: Polly Barner is a 51 y.o. female who presents to the Emergency Department complaining of progressive cough x ~3 days. She states her grandchild had the common cold. She notes associated nausea, vomiting, mild abdominal soreness from vomiting, cough, decreased appetite, subjective fever, chills, dysuria, and generalized body aches. Pt reportedly unable to keep anything down. She states she drinks socially, denies current tobacco use and Hx of illicit drug use.. Pt denies ear pain, Hx of pneumonia.  No aggravating or alleviating factors. No treatments prior to arrival.   Past Medical History:  Diagnosis Date  . Anemia   . Cancer ([place])   . Chronic back pain   . Diabetes mellitus type II   . Hyperlipidemia   . Kidney stones   . Uterine cancer [facility])     Patient Active Problem List   Diagnosis Date Noted  . Depressed mood 10/28/2016  . Endometrial cancer ([place]) 07/28/2016  . Abnormal uterine bleeding 12/30/2015  . Chronic left shoulder pain 02/11/2015  . Nail abnormality 09/16/2014  . Left knee pain 06/23/2014  . Microcytic anemia 07/11/2012  . Paresthesia of left leg 07/08/2012  . Tobacco abuse 03/16/2012  . Low back pain 10/20/2011  . Metabolic syndrome X 55/97/4163  . Headache(784.0) 09/24/2011  . GERD (gastroesophageal reflux disease) 09/02/2011  . Chronic back pain 12/02/2010  . Hyperlipidemia 12/01/2010  . DM (diabetes mellitus), type 2, uncontrolled ([place])  12/01/2010  . Obesity 12/01/2010    Past Surgical History:  Procedure Laterality Date  . CESAREAN SECTION    . KIDNEY STONE SURGERY    . TUBAL LIGATION      OB History    Gravida Para Term Preterm AB Living   2 2 2     2    SAB TAB Ectopic Multiple Live Births                   Home Medications    Prior to Admission medications   Medication Sig Start Date End Date Taking? Authorizing Provider  acetaminophen (TYLENOL) 500 MG tablet Take 1,000 mg by mouth every 6 (six) hours as needed for pain.   Yes Historical Provider, MD  atorvastatin (LIPITOR) 80 MG tablet Take 1 tablet (80 mg total) by mouth daily. 07/09/16  Yes Leeanne Rio, MD  calcium carbonate (TUMS - DOSED IN MG ELEMENTAL CALCIUM) 500 MG chewable tablet Chew 2 tablets by mouth 2 (two) times daily as needed for indigestion or heartburn.   Yes Historical Provider, MD  insulin glargine (LANTUS) 100 unit/mL SOPN Inject 0.33 mLs (33 Units total) into the skin 2 (two) times daily. 09/01/16  Yes Leeanne Rio, MD  insulin lispro (HUMALOG) 100 UNIT/ML KiwkPen Inject 10 Units into the skin 2 (two) times daily. 09/18/16  Yes Historical Provider, MD  Insulin Pen Needle (BD ULTRA-FINE PEN NEEDLES) 29G X 12.7MM MISC Check blood sugar 4x daily 03/28/14  Yes Leeanne Rio, MD  metFORMIN (GLUCOPHAGE-XR) 500 MG 24 hr tablet Take 1  tablet (500 mg total) by mouth 2 (two) times daily. 07/15/16  Yes Lind Covert, MD  albuterol (PROVENTIL HFA;VENTOLIN HFA) 108 (90 Base) MCG/ACT inhaler Inhale 2 puffs into the lungs every 4 (four) hours as needed for wheezing or shortness of breath. 11/02/16   Kathya Wilz, PA-C  benzonatate (TESSALON PERLES) 100 MG capsule Take 1 capsule (100 mg total) by mouth 3 (three) times daily as needed for cough (cough). 11/02/16   Hiawatha Merriott, PA-C  fluticasone (FLONASE) 50 MCG/ACT nasal spray Place 2 sprays into both nostrils daily. 11/02/16   Elizabth Palka, PA-C  oseltamivir (TAMIFLU)  75 MG capsule Take 1 capsule (75 mg total) by mouth every 12 (twelve) hours. 11/02/16   Jarrett Soho Therasa Lorenzi, PA-C    Family History Family History  Problem Relation Age of Onset  . Adopted: Yes  . Alcohol abuse Neg Hx     Social History Social History  Substance Use Topics  . Smoking status: Light Tobacco Smoker    Packs/day: 0.25    Types: Cigarettes  . Smokeless tobacco: Never Used     Comment: one cigarette daily  . Alcohol use 0.0 oz/week     Comment: Once a month     Allergies   Fish allergy and Oxycodone   Review of Systems Review of Systems  Constitutional: Positive for appetite change, chills and fever.  HENT: Negative for ear pain.   Respiratory: Positive for cough.   Gastrointestinal: Positive for abdominal pain, nausea and vomiting.  Musculoskeletal: Positive for arthralgias and myalgias.  All other systems reviewed and are negative.   Physical Exam Updated Vital Signs BP 119/79   Pulse 94   Temp 100.3 F (37.9 C) (Oral)   Resp 18   SpO2 91%   Physical Exam  Constitutional: She appears well-developed and well-nourished. No distress.  Awake, alert, nontoxic appearance  HENT:  Head: Normocephalic and atraumatic.  Right Ear: Tympanic membrane, external ear and ear canal normal.  Left Ear: Tympanic membrane, external ear and ear canal normal.  Nose: Mucosal edema and rhinorrhea present. No epistaxis. Right sinus exhibits no maxillary sinus tenderness and no frontal sinus tenderness. Left sinus exhibits no maxillary sinus tenderness and no frontal sinus tenderness.  Mouth/Throat: Uvula is midline, oropharynx is clear and moist and mucous membranes are normal. Mucous membranes are not pale and not cyanotic. No oropharyngeal exudate, posterior oropharyngeal edema, posterior oropharyngeal erythema or tonsillar abscesses.  Eyes: Conjunctivae are normal. Pupils are equal, round, and reactive to light. No scleral icterus.  Neck: Normal range of motion and full  passive range of motion without pain. Neck supple.  Cardiovascular: Regular rhythm and intact distal pulses.  Tachycardia present.   Pulses:      Radial pulses are 2+ on the right side, and 2+ on the left side.  Pulmonary/Chest: Effort normal. No stridor. No respiratory distress. She has no wheezes. She has rhonchi ( throughout).  Clear and equal breath sounds without focal wheezes, rhonchi, rales  Abdominal: Soft. Bowel sounds are normal. She exhibits no mass. There is no tenderness. There is no rebound and no guarding.  Musculoskeletal: Normal range of motion. She exhibits no edema.  Lymphadenopathy:    She has no cervical adenopathy.  Neurological: She is alert.  Speech is clear and goal oriented Moves extremities without ataxia  Skin: Skin is warm and dry. No rash noted. She is not diaphoretic.  Hot to touch  Psychiatric: She has a normal mood and affect.  Nursing note and vitals  reviewed.    ED Treatments / Results  DIAGNOSTIC STUDIES: Oxygen Saturation is 91% on RA, low by my interpretation.    COORDINATION OF CARE: 2:03 AM Discussed treatment plan with pt at bedside and pt agreed to plan. Will review labs and reassess  Labs (all labs ordered are listed, but only abnormal results are displayed) Labs Reviewed  COMPREHENSIVE METABOLIC PANEL - Abnormal; Notable for the following:       Result Value   Glucose, Bld 189 (*)    Total Protein 8.5 (*)    All other components within normal limits  CBC - Abnormal; Notable for the following:    HCT 34.2 (*)    MCV 68.8 (*)    MCH 24.1 (*)    RDW 16.2 (*)    All other components within normal limits  URINALYSIS, ROUTINE W REFLEX MICROSCOPIC - Abnormal; Notable for the following:    Ketones, ur 5 (*)    All other components within normal limits  INFLUENZA PANEL BY PCR (TYPE A & B) - Abnormal; Notable for the following:    Influenza B By PCR POSITIVE (*)    All other components within normal limits  CULTURE, BLOOD (ROUTINE X 2)    CULTURE, BLOOD (ROUTINE X 2)  LIPASE, BLOOD  I-STAT CG4 LACTIC ACID, ED    EKG  EKG Interpretation  Date/Time:  Monday November 02 2016 02:53:35 EDT Ventricular Rate:  92 PR Interval:    QRS Duration: 81 QT Interval:  348 QTC Calculation: 431 R Axis:   21 Text Interpretation:  Sinus rhythm Confirmed by [facility]  MD, Emmaline Kluver ([id number]) on 11/02/2016 2:55:31 AM       Radiology Dg Chest 2 View  Result Date: 11/02/2016 CLINICAL DATA:  51 y/o  F; cough, fever, tachycardia. EXAM: CHEST  2 VIEW COMPARISON:  08/04/2016 chest radiograph FINDINGS: Stable normal cardiac silhouette. Mild S-shaped curvature of the spine. Left lower lung zone platelike atelectasis. No consolidation, effusion, or pneumothorax. IMPRESSION: Left lung base platelike atelectasis.  Otherwise clear lungs. Electronically Signed   By: Kristine Garbe M.D.   On: 11/02/2016 03:13    Procedures Procedures (including critical care time)  Medications Ordered in ED Medications  benzonatate (TESSALON) capsule 200 mg (not administered)  acetaminophen (TYLENOL) tablet 650 mg (650 mg Oral Given 11/01/16 2113)  sodium chloride 0.9 % bolus 1,000 mL (1,000 mLs Intravenous New Bag/Given 11/02/16 0245)  ibuprofen (ADVIL,MOTRIN) tablet 400 mg (400 mg Oral Given 11/02/16 1829)     Initial Impression / Assessment and Plan / ED Course  I have reviewed the triage vital signs and the nursing notes.  Pertinent labs & imaging results that were available during my care of the patient were reviewed by me and considered in my medical decision making (see chart for details).     Patient presents with URI type symptoms. On arrival she is febrile and tachycardic. On initial exam she is without abdominal tenderness.  Labs reassuring. Urinalysis with some ketones. Fluid bolus given. Influenza B positive. Normal lactic acid. No evidence of surgery or sepsis. Chest x-rays without pneumonia. Patient feeling much better in the department. Emesis  resolved. She is tolerating by mouth. Fever resolved, tachycardia resolved. Will be discharged home. Discussed risk and benefit of Tamiflu including potential side effects.  Will give prescription for team a flu along with conservative therapies. Patient states understanding and is in agreement with the plan.  Final Clinical Impressions(s) / ED Diagnoses   Final diagnoses:  Influenza B  Cough  Congestion of nasal sinus  Non-intractable vomiting with nausea, unspecified vomiting type    New Prescriptions New Prescriptions   ALBUTEROL (PROVENTIL HFA;VENTOLIN HFA) 108 (90 BASE) MCG/ACT INHALER    Inhale 2 puffs into the lungs every 4 (four) hours as needed for wheezing or shortness of breath.   BENZONATATE (TESSALON PERLES) 100 MG CAPSULE    Take 1 capsule (100 mg total) by mouth 3 (three) times daily as needed for cough (cough).   FLUTICASONE (FLONASE) 50 MCG/ACT NASAL SPRAY    Place 2 sprays into both nostrils daily.   OSELTAMIVIR (TAMIFLU) 75 MG CAPSULE    Take 1 capsule (75 mg total) by mouth every 12 (twelve) hours.    I personally performed the services described in this documentation, which was scribed in my presence. The recorded information has been reviewed and is accurate.    Abigail Butts, PA-C 11/02/16 0405    Veatrice Kells, MD 11/02/16 [phone]

## 2016-11-02 NOTE — Discharge Instructions (Signed)
1. Medications: flonase, OTC mucinex, tessalon, tamiflu, usual home medications 2. Treatment: rest, drink plenty of fluids, take tylenol or ibuprofen for fever control 3. Follow Up: Please followup with your primary doctor in 3 days for discussion of your diagnoses and further evaluation after today's visit; if you do not have a primary care doctor use the resource guide provided to find one; Return to the ER for high fevers, difficulty breathing or other concerning symptoms

## 2016-11-07 LAB — CULTURE, BLOOD (ROUTINE X 2)
Culture: NO GROWTH
Culture: NO GROWTH
SPECIAL REQUESTS: ADEQUATE
Special Requests: ADEQUATE

## 2016-11-16 ENCOUNTER — Ambulatory Visit: Payer: Self-pay

## 2016-11-17 ENCOUNTER — Ambulatory Visit: Payer: Self-pay

## 2016-12-03 ENCOUNTER — Ambulatory Visit: Payer: Self-pay | Admitting: Gynecologic Oncology

## 2016-12-15 ENCOUNTER — Other Ambulatory Visit: Payer: Self-pay | Admitting: Family Medicine

## 2016-12-15 NOTE — Telephone Encounter (Signed)
Pt needs a refill called into [facility] for her Metformin. Please do quick since she took the last pills today. jw

## 2016-12-16 MED ORDER — METFORMIN HCL ER 500 MG PO TB24: 500.0000 mg | ORAL_TABLET | Freq: Two times a day (BID) | ORAL | 3 refills | Status: DC

## 2016-12-16 NOTE — Telephone Encounter (Signed)
rx sent

## 2017-01-17 ENCOUNTER — Encounter (HOSPITAL_COMMUNITY): Payer: Self-pay | Admitting: Emergency Medicine

## 2017-01-17 ENCOUNTER — Emergency Department (HOSPITAL_COMMUNITY)
Admission: EM | Admit: 2017-01-17 | Discharge: 2017-01-17 | Disposition: A | Discharge status: Home / Self Care | Payer: Self-pay | Attending: Emergency Medicine | Admitting: Emergency Medicine

## 2017-01-17 ENCOUNTER — Emergency Department (HOSPITAL_COMMUNITY): Payer: Self-pay

## 2017-01-17 DIAGNOSIS — Z794 Long term (current) use of insulin: Secondary | ICD-10-CM | POA: Insufficient documentation

## 2017-01-17 DIAGNOSIS — Y999 Unspecified external cause status: Secondary | ICD-10-CM | POA: Insufficient documentation

## 2017-01-17 DIAGNOSIS — S92514A Nondisplaced fracture of proximal phalanx of right lesser toe(s), initial encounter for closed fracture: Secondary | ICD-10-CM | POA: Insufficient documentation

## 2017-01-17 DIAGNOSIS — E119 Type 2 diabetes mellitus without complications: Secondary | ICD-10-CM | POA: Insufficient documentation

## 2017-01-17 DIAGNOSIS — W228XXA Striking against or struck by other objects, initial encounter: Secondary | ICD-10-CM | POA: Insufficient documentation

## 2017-01-17 DIAGNOSIS — F17210 Nicotine dependence, cigarettes, uncomplicated: Secondary | ICD-10-CM | POA: Insufficient documentation

## 2017-01-17 DIAGNOSIS — Z7984 Long term (current) use of oral hypoglycemic drugs: Secondary | ICD-10-CM | POA: Insufficient documentation

## 2017-01-17 DIAGNOSIS — S92511A Displaced fracture of proximal phalanx of right lesser toe(s), initial encounter for closed fracture: Secondary | ICD-10-CM

## 2017-01-17 DIAGNOSIS — Y9389 Activity, other specified: Secondary | ICD-10-CM | POA: Insufficient documentation

## 2017-01-17 DIAGNOSIS — Y92513 Shop (commercial) as the place of occurrence of the external cause: Secondary | ICD-10-CM | POA: Insufficient documentation

## 2017-01-17 MED ORDER — TRAMADOL HCL 50 MG PO TABS: 50.0000 mg | ORAL_TABLET | Freq: Four times a day (QID) | ORAL | 0 refills | Status: DC | PRN

## 2017-01-17 NOTE — ED Provider Notes (Signed)
[facility] DEPT Provider Note   CSN: [id number] Arrival date & time: 01/17/17  1337  By signing my name below, I, Kristy [name], attest that this documentation has been prepared under the direction and in the presence of [facility] PA-C.  Electronically Signed: Ephriam [name], ED Scribe. 01/17/17. 3:47 PM.  History   Chief Complaint Chief Complaint  Patient presents with  . Toe Injury    HPI HPI Comments: Kristy [name] is a 51 y.o. female who presents to the Emergency Department complaining of persistent pain in her right foot and right 4th toe s/p striking her toe against a shopping cart two days ago. Pt states that she is able to ambulate but reports noticing a gradually worsening exacerbation of pain during ambulation yesterday. This concerned her while she was at work and took her sock off, noticing mild bruising to the toe which prompted her to present here to be evaluated. 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 She has taken ibuprofen without improvement. Pt also requesting a doctors note for work. No significant swelling noted.  The history is provided by the patient. No language interpreter was used.    Past Medical History:  Diagnosis Date  . Anemia   . Cancer ([place])   . Chronic back pain   . Diabetes mellitus type II   . Hyperlipidemia   . Kidney stones   . Uterine cancer [facility])     Patient Active Problem List   Diagnosis Date Noted  . Depressed mood 10/28/2016  . Endometrial cancer ([place]) 07/28/2016  . Abnormal uterine bleeding 12/30/2015  . Chronic left shoulder pain 02/11/2015  . Nail abnormality 09/16/2014  . Left knee pain 06/23/2014  . Microcytic anemia 07/11/2012  . Paresthesia of left leg 07/08/2012  . Tobacco abuse 03/16/2012  . Low back pain 10/20/2011  . Metabolic syndrome X 13/24/4010  . Headache(784.0) 09/24/2011  . GERD (gastroesophageal reflux disease) 09/02/2011  . Chronic back pain 12/02/2010  . Hyperlipidemia 12/01/2010  . DM  (diabetes mellitus), type 2, uncontrolled ([place]) 12/01/2010  . Obesity 12/01/2010    Past Surgical History:  Procedure Laterality Date  . CESAREAN SECTION    . KIDNEY STONE SURGERY    . TUBAL LIGATION      OB History    Gravida Para Term Preterm AB Living   2 2 2     2    SAB TAB Ectopic Multiple Live Births                   Home Medications    Prior to Admission medications   Medication Sig Start Date End Date Taking? Authorizing Provider  acetaminophen (TYLENOL) 500 MG tablet Take 1,000 mg by mouth every 6 (six) hours as needed for pain.    [provider]  albuterol (PROVENTIL HFA;VENTOLIN HFA) 108 (90 Base) MCG/ACT inhaler Inhale 2 puffs into the lungs every 4 (four) hours as needed for wheezing or shortness of breath. 11/02/16   Muthersbaugh, Jarrett Soho, PA-C  atorvastatin (LIPITOR) 80 MG tablet Take 1 tablet (80 mg total) by mouth daily. 07/09/16   Leeanne Rio, MD  benzonatate (TESSALON PERLES) 100 MG capsule Take 1 capsule (100 mg total) by mouth 3 (three) times daily as needed for cough (cough). 11/02/16   Muthersbaugh, Jarrett Soho, PA-C  calcium carbonate (TUMS - DOSED IN MG ELEMENTAL CALCIUM) 500 MG chewable tablet Chew 2 tablets by mouth 2 (two) times daily as needed for indigestion or heartburn.  [provider]  fluticasone (FLONASE) 50 MCG/ACT nasal spray Place 2 sprays into both nostrils daily. 11/02/16   Muthersbaugh, Jarrett Soho, PA-C  insulin glargine (LANTUS) 100 unit/mL SOPN Inject 0.33 mLs (33 Units total) into the skin 2 (two) times daily. 09/01/16   Leeanne Rio, MD  insulin lispro (HUMALOG) 100 UNIT/ML KiwkPen Inject 10 Units into the skin 2 (two) times daily. 09/18/16   [provider]  Insulin Pen Needle (BD ULTRA-FINE PEN NEEDLES) 29G X 12.7MM MISC Check blood sugar 4x daily 03/28/14   Leeanne Rio, MD  metFORMIN (GLUCOPHAGE-XR) 500 MG 24 hr tablet Take 1 tablet (500 mg total) by mouth 2 (two) times daily. 12/16/16   Leeanne Rio, MD  oseltamivir (TAMIFLU) 75 MG capsule Take 1 capsule (75 mg total) by mouth every 12 (twelve) hours. 11/02/16   Muthersbaugh, Jarrett Soho, PA-C  traMADol (ULTRAM) 50 MG tablet Take 1 tablet (50 mg total) by mouth every 6 (six) hours as needed for severe pain. 01/17/17   Clayton Bibles, PA-C    Family History Family History  Problem Relation Age of Onset  . Adopted: Yes  . Alcohol abuse Neg Hx     Social History Social History  Substance Use Topics  . Smoking status: Light Tobacco Smoker    Packs/day: 0.25    Types: Cigarettes  . Smokeless tobacco: Never Used     Comment: one cigarette daily  . Alcohol use 0.0 oz/week     Comment: Once a month     Allergies   Fish allergy and Oxycodone   Review of Systems Review of Systems  Constitutional: Negative for fever.  Cardiovascular: Negative for leg swelling.  Musculoskeletal: Positive for arthralgias (Right fourth toe). Negative for gait problem and joint swelling.  Skin: Positive for color change (mild bruising to right 4th toe). Negative for wound.  Neurological: Negative for weakness and numbness.  Psychiatric/Behavioral: Negative for self-injury.     Physical Exam Updated Vital Signs BP 128/85 (BP Location: Left Arm)   Pulse 93   Temp 98.6 F (37 C) (Oral)   Resp 18   LMP 07/14/2016 (Exact Date)   SpO2 100%   Physical Exam  Constitutional: She appears well-developed and well-nourished. No distress.  HENT:  Head: Normocephalic and atraumatic.  Neck: Neck supple.  Pulmonary/Chest: Effort normal.  Musculoskeletal:  Tenderness and ecchymosis and base of 4th toe on right foot.  No break in skin.  Capillary refill < 2 seconds. No other focal tenderness throughout right lower leg.  Sensation and pulses intact.    Neurological: She is alert.  Skin: She is not diaphoretic.  Nursing note and vitals reviewed.    ED Treatments / Results  DIAGNOSTIC STUDIES: Oxygen Saturation is 100% on RA, normal by my  interpretation.  COORDINATION OF CARE: 3:56 PM-Discussed treatment plan with pt at bedside and pt agreed to plan.   Labs (all labs ordered are listed, but only abnormal results are displayed) Labs Reviewed - No data to display  EKG  EKG Interpretation None       Radiology Dg Foot Complete Right  Result Date: 01/17/2017 CLINICAL DATA:  Pain second and third MTP joints after blunt trauma today. EXAM: RIGHT FOOT COMPLETE - 3+ VIEW COMPARISON:  None. FINDINGS: There is a minimally displaced oblique fracture through the shaft of the fourth proximal phalanx. Minimal degenerate change over the midfoot. Mild spurring over the posterior and inferior calcaneus. IMPRESSION: Minimally displaced oblique fracture of the fourth proximal phalanx. Electronically  Signed   By: Marin Olp M.D.   On: 01/17/2017 15:00    Procedures Procedures (including critical care time)  Medications Ordered in ED Medications - No data to display   Initial Impression / Assessment and Plan / ED Course  I have reviewed the triage vital signs and the nursing notes.  Pertinent labs & imaging results that were available during my care of the patient were reviewed by me and considered in my medical decision making (see chart for details).     Afebrile, nontoxic patient with injury to her right foot, 4th toe while kicking a shopping cart.   Xray demonstrates proximal toe fracture.  Fracture is closed.   D/C home with buddy tape, postop shoe, crutches, pain medication, PCP/ortho follow up.   Discussed result, findings, treatment, and follow up  with patient.  Pt given return precautions.  Pt verbalizes understanding and agrees with plan.      Final Clinical Impressions(s) / ED Diagnoses   Final diagnoses:  Closed fracture of proximal phalanx of lesser toe of right foot, physeal involvement unspecified, initial encounter    New Prescriptions Discharge Medication List as of 01/17/2017  4:07 PM    START taking  these medications   Details  traMADol (ULTRAM) 50 MG tablet Take 1 tablet (50 mg total) by mouth every 6 (six) hours as needed for severe pain., Starting Sun 01/17/2017, Print        I personally performed the services described in this documentation, which was scribed in my presence. The recorded information has been reviewed and is accurate.     Clayton Bibles, PA-C 01/17/17 1626    Charlesetta Shanks, MD 01/30/17 [phone]

## 2017-01-17 NOTE — Discharge Instructions (Signed)
Read the information below.  Use the prescribed medication as directed.  Please discuss all new medications with your pharmacist.  You may return to the Emergency Department at any time for worsening condition or any new symptoms that concern you.    °

## 2017-01-17 NOTE — ED Triage Notes (Signed)
Pt from home with complaints of pain in her foot and 4th toe following stubbing her toe on Friday on a cart at [employer]. Pt is ambulatory without difficulty. No swelling noted

## 2017-04-16 ENCOUNTER — Ambulatory Visit: Payer: Self-pay | Admitting: Family Medicine

## 2017-04-20 ENCOUNTER — Encounter (HOSPITAL_COMMUNITY): Payer: Self-pay | Admitting: Emergency Medicine

## 2017-04-20 ENCOUNTER — Emergency Department (HOSPITAL_COMMUNITY)
Admission: EM | Admit: 2017-04-20 | Discharge: 2017-04-20 | Discharge status: Against Medical Advice | Payer: Self-pay | Attending: Emergency Medicine | Admitting: Emergency Medicine

## 2017-04-20 DIAGNOSIS — Z5321 Procedure and treatment not carried out due to patient leaving prior to being seen by health care provider: Secondary | ICD-10-CM | POA: Insufficient documentation

## 2017-04-20 NOTE — ED Notes (Signed)
Pt called w/o answer.  It is assumed she left.

## 2017-04-20 NOTE — ED Notes (Signed)
Bed: WTR8 Expected date:  Expected time:  Means of arrival:  Comments: 

## 2017-04-20 NOTE — ED Notes (Signed)
Pt called w/o answer.

## 2017-04-20 NOTE — ED Triage Notes (Addendum)
Patient c/o left shoulder pain that radiates down arm with intermittent numbness for 2 weeks.  Patient reports falling on that arm 2 years ago and thought was better. Patient denies numbness at this time nor neuro deficits at this time. Grips are equal, but bilat weak.

## 2017-05-06 ENCOUNTER — Encounter (HOSPITAL_COMMUNITY): Payer: Self-pay

## 2017-05-06 ENCOUNTER — Emergency Department (HOSPITAL_COMMUNITY)
Admission: EM | Admit: 2017-05-06 | Discharge: 2017-05-06 | Disposition: A | Discharge status: Home / Self Care | Payer: Self-pay | Attending: Emergency Medicine | Admitting: Emergency Medicine

## 2017-05-06 DIAGNOSIS — Z8542 Personal history of malignant neoplasm of other parts of uterus: Secondary | ICD-10-CM | POA: Insufficient documentation

## 2017-05-06 DIAGNOSIS — Z794 Long term (current) use of insulin: Secondary | ICD-10-CM | POA: Insufficient documentation

## 2017-05-06 DIAGNOSIS — Z79899 Other long term (current) drug therapy: Secondary | ICD-10-CM | POA: Insufficient documentation

## 2017-05-06 DIAGNOSIS — E119 Type 2 diabetes mellitus without complications: Secondary | ICD-10-CM | POA: Insufficient documentation

## 2017-05-06 DIAGNOSIS — M25512 Pain in left shoulder: Secondary | ICD-10-CM | POA: Insufficient documentation

## 2017-05-06 MED ORDER — NAPROXEN 500 MG PO TABS: 500.0000 mg | ORAL_TABLET | Freq: Two times a day (BID) | ORAL | 0 refills | Status: DC

## 2017-05-06 MED ORDER — NAPROXEN 250 MG PO TABS
500.0000 mg | ORAL_TABLET | Freq: Once | ORAL | Status: AC
  Administered 2017-05-06: 500 mg via ORAL
  Filled 2017-05-06: qty 2

## 2017-05-06 MED ORDER — CYCLOBENZAPRINE HCL 5 MG PO TABS: 5.0000 mg | ORAL_TABLET | Freq: Every evening | ORAL | 0 refills | Status: DC | PRN
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
## 2017-05-06 NOTE — Discharge Instructions (Signed)
Please read instructions below. Apply ice to your shoulder for 20 minutes at a time. You can take naprosyn every 12 hours with meals as needed for pain. You can take Flexeril at bedtime as needed for muscle spasm.  Drink plenty of water. Schedule an appointment with your primary care provider to follow-up on your visit today. Return to the ER for severe headache, vision changes, vomiting, numbness or tingling in your extremities, bowel or bladder incontinence, or new or concerning symptoms.

## 2017-05-06 NOTE — ED Notes (Signed)
Patient at tech first desk again complaining and asking for a warm blanket.  Patient eating chips and appears in no distress.  Patient given blanket and encouraged to wait.  Patient appears agitated.

## 2017-05-06 NOTE — ED Notes (Signed)
Called ortho for arm sling.
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
## 2017-05-06 NOTE — ED Notes (Signed)
Patient approached tech first complaining about wait.  Wants to leave and go to Urgent Care.  Advised patient she will be the next patient to go to fast track area.  Patient states "everyone tells me something different and I'm sick of waiting"  Encouraged patient to wait and she will be see soon.  Patient visibly upset and sat down in lobby. Will continue to monitor.   Patient has only been waiting a hour.

## 2017-06-03 ENCOUNTER — Encounter: Payer: Self-pay | Admitting: Family Medicine

## 2017-06-03 ENCOUNTER — Ambulatory Visit (INDEPENDENT_AMBULATORY_CARE_PROVIDER_SITE_OTHER): Payer: Self-pay | Admitting: Family Medicine

## 2017-06-03 VITALS — BP 118/72 | HR 104 | Temp 98.6°F | Wt 187.0 lb

## 2017-06-03 DIAGNOSIS — E785 Hyperlipidemia, unspecified: Secondary | ICD-10-CM

## 2017-06-03 DIAGNOSIS — Z1231 Encounter for screening mammogram for malignant neoplasm of breast: Secondary | ICD-10-CM

## 2017-06-03 DIAGNOSIS — G8929 Other chronic pain: Secondary | ICD-10-CM

## 2017-06-03 DIAGNOSIS — Z1159 Encounter for screening for other viral diseases: Secondary | ICD-10-CM

## 2017-06-03 DIAGNOSIS — Z1239 Encounter for other screening for malignant neoplasm of breast: Secondary | ICD-10-CM

## 2017-06-03 DIAGNOSIS — M25512 Pain in left shoulder: Secondary | ICD-10-CM

## 2017-06-03 DIAGNOSIS — E1165 Type 2 diabetes mellitus with hyperglycemia: Secondary | ICD-10-CM

## 2017-06-03 LAB — POCT GLYCOSYLATED HEMOGLOBIN (HGB A1C): Hemoglobin A1C: 7.6

## 2017-06-03 MED ORDER — METFORMIN HCL ER 500 MG PO TB24: 500.0000 mg | ORAL_TABLET | Freq: Two times a day (BID) | ORAL | 3 refills | Status: DC

## 2017-06-03 MED ORDER — INSULIN LISPRO 100 UNIT/ML (KWIKPEN): 10.0000 [IU] | PEN_INJECTOR | Freq: Two times a day (BID) | SUBCUTANEOUS | 3 refills | Status: DC

## 2017-06-03 MED ORDER — ATORVASTATIN CALCIUM 80 MG PO TABS: 80.0000 mg | ORAL_TABLET | Freq: Every day | ORAL | 3 refills | Status: DC

## 2017-06-03 MED ORDER — INSULIN GLARGINE 100 UNITS/ML SOLOSTAR PEN: 28.0000 [IU] | PEN_INJECTOR | Freq: Two times a day (BID) | SUBCUTANEOUS | 3 refills | Status: DC

## 2017-06-03 NOTE — Assessment & Plan Note (Signed)
Exam consistent with impingement of rotator cuff.  Discussed options for treatment today including anti-inflammatories, Tylenol, exercises, or subacromial steroid injection.  Patient prefers to proceed with Tylenol and exercises.  Given handout.

## 2017-06-03 NOTE — Assessment & Plan Note (Signed)
Refill atorvastatin.  Check lipids next month.  Also check complete metabolic panel.
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
## 2017-06-03 NOTE — Patient Instructions (Signed)
It was great to see you again today!  For hands/feet: - get a good lotion and moisturize multiple times per day - let me know if still having issues with itching  For diabetes: - awesome job lowering your A1c!! - decrease lantus to 28 units twice daily  - continue humalog - sent in refills - checking urine today  For cholesterol: - continue lipitor, sent in refill  For shoulder: - take tylenol 1000mg  up to three times daily as needed - see handout below with exercises for your shoulder  On your way out, schedule an appointment one morning to come back for fasting labs in mid-December. Do not eat or drink anything other than water the morning of your lab appointment until after your labs are drawn.   Health maintenance: -See the handout on how to schedule your mammogram. This is an important test to screen for breast cancer.  - get flu shot when you come for labwork - schedule your diabetic eye exam  Follow up with me in 3 months, sooner if needed  Be well, Dr. Ardelia Mems    Shoulder Impingement Syndrome Rehab Ask your health care provider which exercises are safe for you. Do exercises exactly as told by your health care provider and adjust them as directed. It is normal to feel mild stretching, pulling, tightness, or discomfort as you do these exercises, but you should stop right away if you feel sudden pain or your pain gets worse.Do not begin these exercises until told by your health care provider. Stretching and range of motion exercise This exercise warms up your muscles and joints and improves the movement and flexibility of your shoulder. This exercise also helps to relieve pain and stiffness. Exercise A: Passive horizontal adduction  1. Sit or stand and pull your left / right elbow across your chest, toward your other shoulder. Stop when you feel a gentle stretch in the back of your shoulder and upper arm. ? Keep your arm at shoulder height. ? Keep your arm as close to  your body as you comfortably can. 2. Hold for __________ seconds. 3. Slowly return to the starting position. Repeat __________ times. Complete this exercise __________ times a day. Strengthening exercises These exercises build strength and endurance in your shoulder. Endurance is the ability to use your muscles for a long time, even after they get tired. Exercise B: External rotation, isometric 1. Stand or sit in a doorway, facing the door frame. 2. Bend your left / right elbow and place the back of your wrist against the door frame. Only your wrist should be touching the frame. Keep your upper arm at your side. 3. Gently press your wrist against the door frame, as if you are trying to push your arm away from your abdomen. ? Avoid shrugging your shoulder while you press your hand against the door frame. Keep your shoulder blade tucked down toward the middle of your back. 4. Hold for __________ seconds. 5. Slowly release the tension, and relax your muscles completely before you do the exercise again. Repeat __________ times. Complete this exercise __________ times a day. Exercise C: Internal rotation, isometric  1. Stand or sit in a doorway, facing the door frame. 2. Bend your left / right elbow and place the inside of your wrist against the door frame. Only your wrist should be touching the frame. Keep your upper arm at your side. 3. Gently press your wrist against the door frame, as if you are trying to push  your arm toward your abdomen. ? Avoid shrugging your shoulder while you press your hand against the door frame. Keep your shoulder blade tucked down toward the middle of your back. 4. Hold for __________ seconds. 5. Slowly release the tension, and relax your muscles completely before you do the exercise again. Repeat __________ times. Complete this exercise __________ times a day. Exercise D: Scapular protraction, supine  1. Lie on your back on a firm surface. Hold a __________ weight  in your left / right hand. 2. Raise your left / right arm straight into the air so your hand is directly above your shoulder joint. 3. Push the weight into the air so your shoulder lifts off of the surface that you are lying on. Do not move your head, neck, or back. 4. Hold for __________ seconds. 5. Slowly return to the starting position. Let your muscles relax completely before you repeat this exercise. Repeat __________ times. Complete this exercise __________ times a day. Exercise E: Scapular retraction  1. Sit in a stable chair without armrests, or stand. 2. Secure an exercise band to a stable object in front of you so the band is at shoulder height. 3. Hold one end of the exercise band in each hand. Your palms should face down. 4. Squeeze your shoulder blades together and move your elbows slightly behind you. Do not shrug your shoulders while you do this. 5. Hold for __________ seconds. 6. Slowly return to the starting position. Repeat __________ times. Complete this exercise __________ times a day. Exercise F: Shoulder extension  1. Sit in a stable chair without armrests, or stand. 2. Secure an exercise band to a stable object in front of you where the band is above shoulder height. 3. Hold one end of the exercise band in each hand. 4. Straighten your elbows and lift your hands up to shoulder height. 5. Squeeze your shoulder blades together and pull your hands down to the sides of your thighs. Stop when your hands are straight down by your sides. Do not let your hands go behind your body. 6. Hold for __________ seconds. 7. Slowly return to the starting position. Repeat __________ times. Complete this exercise __________ times a day. This information is not intended to replace advice given to you by your health care provider. Make sure you discuss any questions you have with your health care provider. Document Released: 07/13/2005 Document Revised: 03/19/2016 Document Reviewed:  06/15/2015 Elsevier Interactive Patient Education  [employer].

## 2017-06-03 NOTE — Assessment & Plan Note (Signed)
A1c improved at 7.6.  She is having occasional hypoglycemic episodes.  Will lower her Lantus to 28 units twice daily.  Continue with Humalog 10 units twice daily.  Continue Metformin.  Refills prescribed  Cardiac: refill statin today, check lipids in 1 mo Renal: check urine microalbumin today Eye: Encouraged to schedule eye exam Foot: None today, normal Immunizations: Advised flu shot, patient prefers to wait till next visit.  Otherwise up-to-date on vaccinations

## 2017-06-04 ENCOUNTER — Encounter: Payer: Self-pay | Admitting: Family Medicine

## 2017-06-04 LAB — MICROALBUMIN / CREATININE URINE RATIO
Creatinine, Urine: 96.2 mg/dL
MICROALB/CREAT RATIO: 10.7 mg/g{creat} (ref 0.0–30.0)
MICROALBUM., U, RANDOM: 10.3 ug/mL

## 2017-06-16 ENCOUNTER — Other Ambulatory Visit: Payer: Self-pay | Admitting: Family Medicine

## 2017-06-16 MED ORDER — METFORMIN HCL ER 500 MG PO TB24: 500.0000 mg | ORAL_TABLET | Freq: Two times a day (BID) | ORAL | 3 refills | Status: DC

## 2017-06-23 ENCOUNTER — Ambulatory Visit: Payer: Self-pay

## 2017-07-23 ENCOUNTER — Other Ambulatory Visit: Payer: Self-pay | Admitting: Family Medicine

## 2017-07-23 MED ORDER — INSULIN GLARGINE 100 UNITS/ML SOLOSTAR PEN: 28.0000 [IU] | PEN_INJECTOR | Freq: Two times a day (BID) | SUBCUTANEOUS | 3 refills | Status: DC

## 2017-07-23 MED ORDER — INSULIN LISPRO 100 UNIT/ML (KWIKPEN): 10.0000 [IU] | PEN_INJECTOR | Freq: Two times a day (BID) | SUBCUTANEOUS | 3 refills | Status: DC

## 2017-07-23 NOTE — Telephone Encounter (Signed)
Patient is out of insulin and has been trying to get it refilled.  [facility] dept needs it authorized and they have been requesting this.  Please call patient to let her know what is going on with this today at [phone].

## 2017-07-23 NOTE — Telephone Encounter (Signed)
I have only received one faxed request for this medication, and it was sent over today at 2:37pm from [facility]. I am not sure why other requests have not come through, but this is the first I'm hearing of it.  Please let patient know I am faxing her medication over right now. Leeanne Rio, MD

## 2017-07-23 NOTE — Telephone Encounter (Signed)
Patient informed.  Jazmin Hartsell,CMA  

## 2017-07-28 ENCOUNTER — Telehealth: Payer: Self-pay | Admitting: Family Medicine

## 2017-07-28 MED ORDER — INSULIN GLARGINE 100 UNITS/ML SOLOSTAR PEN: 28.0000 [IU] | PEN_INJECTOR | Freq: Two times a day (BID) | SUBCUTANEOUS | 3 refills | Status: DC

## 2017-07-28 MED ORDER — INSULIN LISPRO 100 UNIT/ML (KWIKPEN): 10.0000 [IU] | PEN_INJECTOR | Freq: Two times a day (BID) | SUBCUTANEOUS | 3 refills | Status: DC

## 2017-07-28 NOTE — Telephone Encounter (Signed)
Her prescriptions for insulin both went to [facility] but they are supposed to go to the health department.  Patient needs asap.  Please call patient to let her know at [phone].

## 2017-07-28 NOTE — Telephone Encounter (Signed)
rx sent to [facility]. Please let patient know & apologize for the mistake.  Leeanne Rio, MD

## 2017-08-31 ENCOUNTER — Inpatient Hospital Stay (HOSPITAL_COMMUNITY)
Admission: AD | Admit: 2017-08-31 | Discharge: 2017-08-31 | Disposition: A | Discharge status: Home / Self Care | Payer: Self-pay | Source: Ambulatory Visit | Attending: Obstetrics and Gynecology | Admitting: Obstetrics and Gynecology

## 2017-08-31 ENCOUNTER — Encounter (HOSPITAL_COMMUNITY): Payer: Self-pay | Admitting: *Deleted

## 2017-08-31 ENCOUNTER — Other Ambulatory Visit: Payer: Self-pay

## 2017-08-31 DIAGNOSIS — Z87891 Personal history of nicotine dependence: Secondary | ICD-10-CM | POA: Insufficient documentation

## 2017-08-31 DIAGNOSIS — B3731 Acute candidiasis of vulva and vagina: Secondary | ICD-10-CM

## 2017-08-31 DIAGNOSIS — Z885 Allergy status to narcotic agent status: Secondary | ICD-10-CM | POA: Insufficient documentation

## 2017-08-31 DIAGNOSIS — B373 Candidiasis of vulva and vagina: Secondary | ICD-10-CM | POA: Insufficient documentation

## 2017-08-31 LAB — URINALYSIS, ROUTINE W REFLEX MICROSCOPIC
BILIRUBIN URINE: NEGATIVE
Bacteria, UA: NONE SEEN
Glucose, UA: >=500 mg/dL — AB
Hgb urine dipstick: NEGATIVE
KETONES UR: NEGATIVE mg/dL
NITRITE: NEGATIVE
Protein, ur: NEGATIVE mg/dL
SPECIFIC GRAVITY, URINE: 1.03 (ref 1.005–1.030)
pH: 6 (ref 5.0–8.0)

## 2017-08-31 LAB — WET PREP, GENITAL
CLUE CELLS WET PREP: NONE SEEN
Sperm: NONE SEEN
Trich, Wet Prep: NONE SEEN

## 2017-08-31 MED ORDER — FLUCONAZOLE 150 MG PO TABS
150.0000 mg | ORAL_TABLET | Freq: Once | ORAL | Status: AC
  Administered 2017-08-31: 150 mg via ORAL
  Filled 2017-08-31: qty 1

## 2017-08-31 MED ORDER — FLUCONAZOLE 150 MG PO TABS: 150.0000 mg | ORAL_TABLET | Freq: Every day | ORAL | 0 refills | Status: DC

## 2017-08-31 MED ORDER — NYSTATIN 100000 UNIT/GM EX CREA: TOPICAL_CREAM | CUTANEOUS | 0 refills | Status: DC

## 2017-08-31 NOTE — MAU Note (Signed)
Been dealing with vaginal itching and burning for over a wk.  Tried OTC vagisil wipes, some relief, but has returned. Denies seeing d/c.

## 2017-08-31 NOTE — Discharge Instructions (Signed)

## 2017-08-31 NOTE — MAU Provider Note (Signed)
History     CSN: [id number]  Arrival date and time: 08/31/17 9528   First Provider Initiated Contact with Patient 08/31/17 [phone]      Chief Complaint  Patient presents with  . Vaginal Itching  . vaginal burning   HPI  Ms. Kristy [name] is a 52 y.o. [phone] with IDDM who presents to MAU today with complaint of vaginal itching and discharge x 1 week. The patient tried OTC vaginal wipes without significant relief. She denies other pain, bleeding or fever. She sees [facility] for management of her DM.    OB History    Gravida Para Term Preterm AB Living   2 2 2     2    SAB TAB Ectopic Multiple Live Births                  Past Medical History:  Diagnosis Date  . Anemia   . Cancer ([place])   . Chronic back pain   . Diabetes mellitus type II   . Hyperlipidemia   . Kidney stones   . Uterine cancer [facility])     Past Surgical History:  Procedure Laterality Date  . ABDOMINAL HYSTERECTOMY    . CESAREAN SECTION    . KIDNEY STONE SURGERY    . NO PAST SURGERIES    . TUBAL LIGATION      Family History  Adopted: Yes  Problem Relation Age of Onset  . Alcohol abuse Neg Hx     Social History   Tobacco Use  . Smoking status: Former Smoker    Packs/day: 0.25    Types: Cigarettes    Last attempt to quit: 10/01/2016    Years since quitting: 0.9  . Smokeless tobacco: Never Used  . Tobacco comment: one cigarette daily  Substance Use Topics  . Alcohol use: Yes    Alcohol/week: 0.0 oz    Comment: Once a month  . Drug use: No    Allergies:  Allergies  Allergen Reactions  . Fish Allergy Hives    hives  . Oxycodone Itching    Medications Prior to Admission  Medication Sig Dispense Refill Last Dose  . acetaminophen (TYLENOL) 500 MG tablet Take 1,000 mg by mouth every 6 (six) hours as needed for pain.   Past Week at Unknown time  . calcium carbonate (TUMS - DOSED IN MG ELEMENTAL CALCIUM) 500 MG chewable tablet Chew 2 tablets by mouth 2 (two) times daily as needed for indigestion or  heartburn.   08/30/2017 at Unknown time  . insulin glargine (LANTUS) 100 unit/mL SOPN Inject 0.28 mLs (28 Units total) into the skin 2 (two) times daily. (Patient taking differently: Inject 30 Units into the skin 2 (two) times daily. ) 18 mL 3 08/30/2017 at Unknown time  . insulin lispro (HUMALOG) 100 UNIT/ML KiwkPen Inject 0.1 mLs (10 Units total) into the skin 2 (two) times daily. 15 mL 3 08/30/2017 at Unknown time  . metFORMIN (GLUCOPHAGE-XR) 500 MG 24 hr tablet Take 1 tablet (500 mg total) by mouth 2 (two) times daily. 180 tablet 3 08/30/2017 at Unknown time  . atorvastatin (LIPITOR) 80 MG tablet Take 1 tablet (80 mg total) daily by mouth. 90 tablet 3   . Insulin Pen Needle (BD ULTRA-FINE PEN NEEDLES) 29G X 12.7MM MISC Check blood sugar 4x daily 100 each 11 Taking    Review of Systems  Constitutional: Negative for fever.  Gastrointestinal: Negative for abdominal pain.  Genitourinary: Positive for vaginal discharge. Negative for dysuria, frequency, urgency and  vaginal bleeding.   Physical Exam   Blood pressure (!) 96/50, pulse 88, temperature 98.8 F (37.1 C), temperature source Oral, resp. rate 16, weight 187 lb 4 oz (84.9 kg), last menstrual period 07/14/2016.  Physical Exam  Nursing note and vitals reviewed. Constitutional: She is oriented to person, place, and time. She appears well-developed and well-nourished. No distress.  HENT:  Head: Normocephalic and atraumatic.  Cardiovascular: Normal rate.  Respiratory: Effort normal.  GI: Soft. She exhibits no distension. There is no tenderness.  Genitourinary: No bleeding in the vagina. Vaginal discharge (moderate thick, yellow) found.  Neurological: She is alert and oriented to person, place, and time.  Skin: Skin is warm and dry. No erythema.  Psychiatric: She has a normal mood and affect.   Results for orders placed or performed during the hospital encounter of 08/31/17 (from the past 24 hour(s))  Urinalysis, Routine w reflex microscopic      Status: Abnormal   Collection Time: 08/31/17  8:08 AM  Result Value Ref Range   Color, Urine YELLOW YELLOW   APPearance HAZY (A) CLEAR   Specific Gravity, Urine 1.030 1.005 - 1.030   pH 6.0 5.0 - 8.0   Glucose, UA >=500 (A) NEGATIVE mg/dL   Hgb urine dipstick NEGATIVE NEGATIVE   Bilirubin Urine NEGATIVE NEGATIVE   Ketones, ur NEGATIVE NEGATIVE mg/dL   Protein, ur NEGATIVE NEGATIVE mg/dL   Nitrite NEGATIVE NEGATIVE   Leukocytes, UA MODERATE (A) NEGATIVE   RBC / HPF 0-5 0 - 5 RBC/hpf   WBC, UA 6-30 0 - 5 WBC/hpf   Bacteria, UA NONE SEEN NONE SEEN   Squamous Epithelial / LPF 6-30 (A) NONE SEEN  Wet prep, genital     Status: Abnormal   Collection Time: 08/31/17  8:45 AM  Result Value Ref Range   Yeast Wet Prep HPF POC PRESENT (A) NONE SEEN   Trich, Wet Prep NONE SEEN NONE SEEN   Clue Cells Wet Prep HPF POC NONE SEEN NONE SEEN   WBC, Wet Prep HPF POC MANY (A) NONE SEEN   Sperm NONE SEEN     MAU Course  Procedures None  MDM Wet prep today  150 mg Diflucan given in MAU today   Assessment and Plan  A: Yeast vulvovaginitis   P:  Discharge home Rx for Diflucan to be taken in 3 days and Nystatin for cutaneous yeast Warning signs for worsening condition discussed Patient advised to follow-up with [facility] if symptoms persist or worsen Patient may return to MAU as needed or if her condition were to change or worsen  Kerry Hough, PA-C 08/31/2017, 9:29 AM

## 2017-11-09 ENCOUNTER — Ambulatory Visit: Payer: Self-pay | Admitting: Family Medicine

## 2017-11-29 ENCOUNTER — Other Ambulatory Visit: Payer: Self-pay

## 2017-11-29 MED ORDER — INSULIN GLARGINE 100 UNITS/ML SOLOSTAR PEN: 28.0000 [IU] | PEN_INJECTOR | Freq: Two times a day (BID) | SUBCUTANEOUS | 0 refills | Status: DC

## 2017-11-30 ENCOUNTER — Emergency Department (HOSPITAL_COMMUNITY): Admission: EM | Admit: 2017-11-30 | Discharge: 2017-11-30 | Discharge status: Absent without leave | Payer: Self-pay

## 2017-12-17 ENCOUNTER — Encounter: Payer: Self-pay | Admitting: Family Medicine

## 2017-12-17 ENCOUNTER — Ambulatory Visit (INDEPENDENT_AMBULATORY_CARE_PROVIDER_SITE_OTHER): Payer: Self-pay | Admitting: Family Medicine

## 2017-12-17 ENCOUNTER — Other Ambulatory Visit: Payer: Self-pay

## 2017-12-17 VITALS — BP 124/80 | HR 76 | Temp 98.8°F | Ht 59.0 in | Wt 189.0 lb

## 2017-12-17 DIAGNOSIS — G8929 Other chronic pain: Secondary | ICD-10-CM

## 2017-12-17 DIAGNOSIS — Z1159 Encounter for screening for other viral diseases: Secondary | ICD-10-CM

## 2017-12-17 DIAGNOSIS — E1165 Type 2 diabetes mellitus with hyperglycemia: Secondary | ICD-10-CM

## 2017-12-17 DIAGNOSIS — M25512 Pain in left shoulder: Secondary | ICD-10-CM

## 2017-12-17 DIAGNOSIS — E785 Hyperlipidemia, unspecified: Secondary | ICD-10-CM

## 2017-12-17 DIAGNOSIS — Z1211 Encounter for screening for malignant neoplasm of colon: Secondary | ICD-10-CM

## 2017-12-17 LAB — POCT GLYCOSYLATED HEMOGLOBIN (HGB A1C): HBA1C, POC (CONTROLLED DIABETIC RANGE): 11.3 % — AB (ref 0.0–7.0)

## 2017-12-17 MED ORDER — INSULIN GLARGINE 100 UNITS/ML SOLOSTAR PEN: 31.0000 [IU] | PEN_INJECTOR | Freq: Two times a day (BID) | SUBCUTANEOUS | 3 refills | Status: DC

## 2017-12-17 MED ORDER — ATORVASTATIN CALCIUM 80 MG PO TABS: 80.0000 mg | ORAL_TABLET | Freq: Every day | ORAL | 3 refills | Status: DC

## 2017-12-17 MED ORDER — MELOXICAM 7.5 MG PO TABS: 7.5000 mg | ORAL_TABLET | Freq: Every day | ORAL | 1 refills | Status: DC | PRN

## 2017-12-17 MED ORDER — INSULIN LISPRO 100 UNIT/ML (KWIKPEN): 10.0000 [IU] | PEN_INJECTOR | Freq: Two times a day (BID) | SUBCUTANEOUS | 3 refills | Status: DC

## 2017-12-17 NOTE — Patient Instructions (Addendum)
It was great to see you again today!  For diabetes: - increase lantus to 31 units twice daily. - schedule an appointment with Dr. Valentina Lucks to discuss diabetes. Bring all your diabetes supplies with you to that visit.  For shoulder: - sent antiinflammatory medication (meloxicam) to your pharmacy. Take once a day as needed when pain flares.  Checking labs today.  See the handout on how to schedule your mammogram. This is an important test to screen for breast cancer.  Referring to GI doctor for colonoscopy.  Schedule your eye exam.  Follow up with me after you see Dr. Valentina Lucks.  Be well, Dr. Ardelia Mems

## 2017-12-17 NOTE — Progress Notes (Signed)
Date of Visit: 12/17/2017   HPI:  Patient presents for routine follow up.  Diabetes - taking humalog 10u twice daily but stopped a week ago. Also taking lantus 28 units twice daily. Thinks they gave her the wrong humalog pens at the pharmacy. Checking sugars. Lowest she gets is in 180s fasting. Willing to schedule eye exam, past due.  Hyperlipidemia - not taking atorvastatin at present because pharmacy told her they never got it. Willing to take, needs printed rx.  Shoulder pain - has pain in shoulder, worse with raising arm up. Happens just some days, not every day. Has history of the same in the past, previously had steroid injection for it.  HM: willing to get colonoscopy & eye exam, also mammogram  ROS: See HPI.  [place]: history of type 2 diabetes, hyperlipidemia, endometrial cancer s/p hysterectomy, GERD  PHYSICAL EXAM: BP 124/80 (BP Location: Right Arm, Patient Position: Sitting, Cuff Size: Normal)   Pulse 76   Temp 98.8 F (37.1 C) (Oral)   Ht 4\' 11"  (1.499 m)   Wt 189 lb (85.7 kg)   LMP 07/14/2016 (Exact Date)   SpO2 96%   BMI 38.17 kg/m  Gen: no acute distress, pleasant, cooperative HEENT: normocephalic, atraumatic, moist mucous membranes  Heart: regular rate and rhythm, no murmur Lungs: clear to auscultation bilaterally, normal work of breathing  Neuro: alert, grossly nonfocal, speech normal Ext: No appreciable lower extremity edema bilaterally. Full range of motion of bilateral shoulders. Full strenght bilateral upper & lower extremities. Pain with full abduction of shoulder  ASSESSMENT/PLAN:  Health maintenance:  -refer to GI for colonoscopy -mammogram handout given & order placed -advised to schedule eye exam -hep C antibody obtained today  DM (diabetes mellitus), type 2, uncontrolled Uncontrolled, A1c 11.3. Increase lantus to 31 units twice daily. Advised calling pharmacy to have them check on the humalog pens she got. Will have her follow up with Dr. Valentina Lucks  in our pharm clinic for diabetes management. Patient to schedule eye exam. Check lipids & CMET today.  Hyperlipidemia rx for atorvastatin printed for patient   Chronic left shoulder pain Consistent with rotator cuff impingement syndrome No steroid shot today due to uncontrolled A1c Will rx meloxicam for as needed use  FOLLOW UP: Follow up with Dr. Valentina Lucks for diabetes, then see me after that viist Referring to GI for colonoscopy  Kristy [name], [place]

## 2017-12-17 NOTE — Progress Notes (Signed)
POC4 

## 2017-12-18 LAB — CMP14+EGFR
ALBUMIN: 4 g/dL (ref 3.5–5.5)
ALK PHOS: 114 IU/L (ref 39–117)
ALT: 14 IU/L (ref 0–32)
AST: 11 IU/L (ref 0–40)
Albumin/Globulin Ratio: 1.2 (ref 1.2–2.2)
BUN / CREAT RATIO: 12 (ref 9–23)
BUN: 8 mg/dL (ref 6–24)
Bilirubin Total: 0.9 mg/dL (ref 0.0–1.2)
CO2: 25 mmol/L (ref 20–29)
CREATININE: 0.65 mg/dL (ref 0.57–1.00)
Calcium: 9.5 mg/dL (ref 8.7–10.2)
Chloride: 103 mmol/L (ref 96–106)
GFR calc Af Amer: 117 mL/min/{1.73_m2} (ref >59)
GFR calc non Af Amer: 102 mL/min/{1.73_m2} (ref >59)
GLOBULIN, TOTAL: 3.3 g/dL (ref 1.5–4.5)
GLUCOSE: 208 mg/dL — AB (ref 65–99)
Potassium: 4.2 mmol/L (ref 3.5–5.2)
SODIUM: 140 mmol/L (ref 134–144)
TOTAL PROTEIN: 7.3 g/dL (ref 6.0–8.5)

## 2017-12-18 LAB — LIPID PANEL
CHOL/HDL RATIO: 6.9 ratio — AB (ref 0.0–4.4)
Cholesterol, Total: 213 mg/dL — ABNORMAL HIGH (ref 100–199)
HDL: 31 mg/dL — ABNORMAL LOW (ref >39)
LDL CALC: 146 mg/dL — AB (ref 0–99)
Triglycerides: 179 mg/dL — ABNORMAL HIGH (ref 0–149)
VLDL CHOLESTEROL CAL: 36 mg/dL (ref 5–40)

## 2017-12-18 LAB — HEPATITIS C ANTIBODY: Hep C Virus Ab: <0.1 s/co ratio (ref 0.0–0.9)

## 2017-12-24 ENCOUNTER — Encounter: Payer: Self-pay | Admitting: Family Medicine

## 2017-12-26 NOTE — Assessment & Plan Note (Signed)
Uncontrolled, A1c 11.3. Increase lantus to 31 units twice daily. Advised calling pharmacy to have them check on the humalog pens she got. Will have her follow up with Dr. Valentina Lucks in our pharm clinic for diabetes management. Patient to schedule eye exam. Check lipids & CMET today.

## 2017-12-26 NOTE — Assessment & Plan Note (Signed)
rx for atorvastatin printed for patient

## 2017-12-26 NOTE — Assessment & Plan Note (Signed)
Consistent with rotator cuff impingement syndrome No steroid shot today due to uncontrolled A1c Will rx meloxicam for as needed use

## 2018-01-04 ENCOUNTER — Emergency Department (HOSPITAL_COMMUNITY)
Admission: EM | Admit: 2018-01-04 | Discharge: 2018-01-04 | Disposition: A | Discharge status: Home / Self Care | Payer: Self-pay | Attending: Emergency Medicine | Admitting: Emergency Medicine

## 2018-01-04 ENCOUNTER — Encounter (HOSPITAL_COMMUNITY): Payer: Self-pay

## 2018-01-04 DIAGNOSIS — Z79899 Other long term (current) drug therapy: Secondary | ICD-10-CM | POA: Insufficient documentation

## 2018-01-04 DIAGNOSIS — R1110 Vomiting, unspecified: Secondary | ICD-10-CM | POA: Insufficient documentation

## 2018-01-04 LAB — CBG MONITORING, ED: GLUCOSE-CAPILLARY: 213 mg/dL — AB (ref 65–99)

## 2018-01-04 NOTE — ED Triage Notes (Signed)
Pt complains of an elevated blood sugar and vomiting since going to work [employer]

## 2018-01-04 NOTE — ED Notes (Addendum)
Follow up call made    01/04/18  1056  s Darl Kuss rn

## 2018-01-26 ENCOUNTER — Ambulatory Visit: Payer: Self-pay

## 2018-01-30 IMAGING — CR DG CHEST 2V
2 series · 2 of 2 positions shown · non-contrast
Comparison: 07/10/2014

CLINICAL DATA: Malignant neoplasm of uterus, unspecified site,
occasional light smoker, priors 07/10/14([HOSPITAL] Pacs), no other
chest complaints

EXAM:
CHEST  2 VIEW

[chest pa]
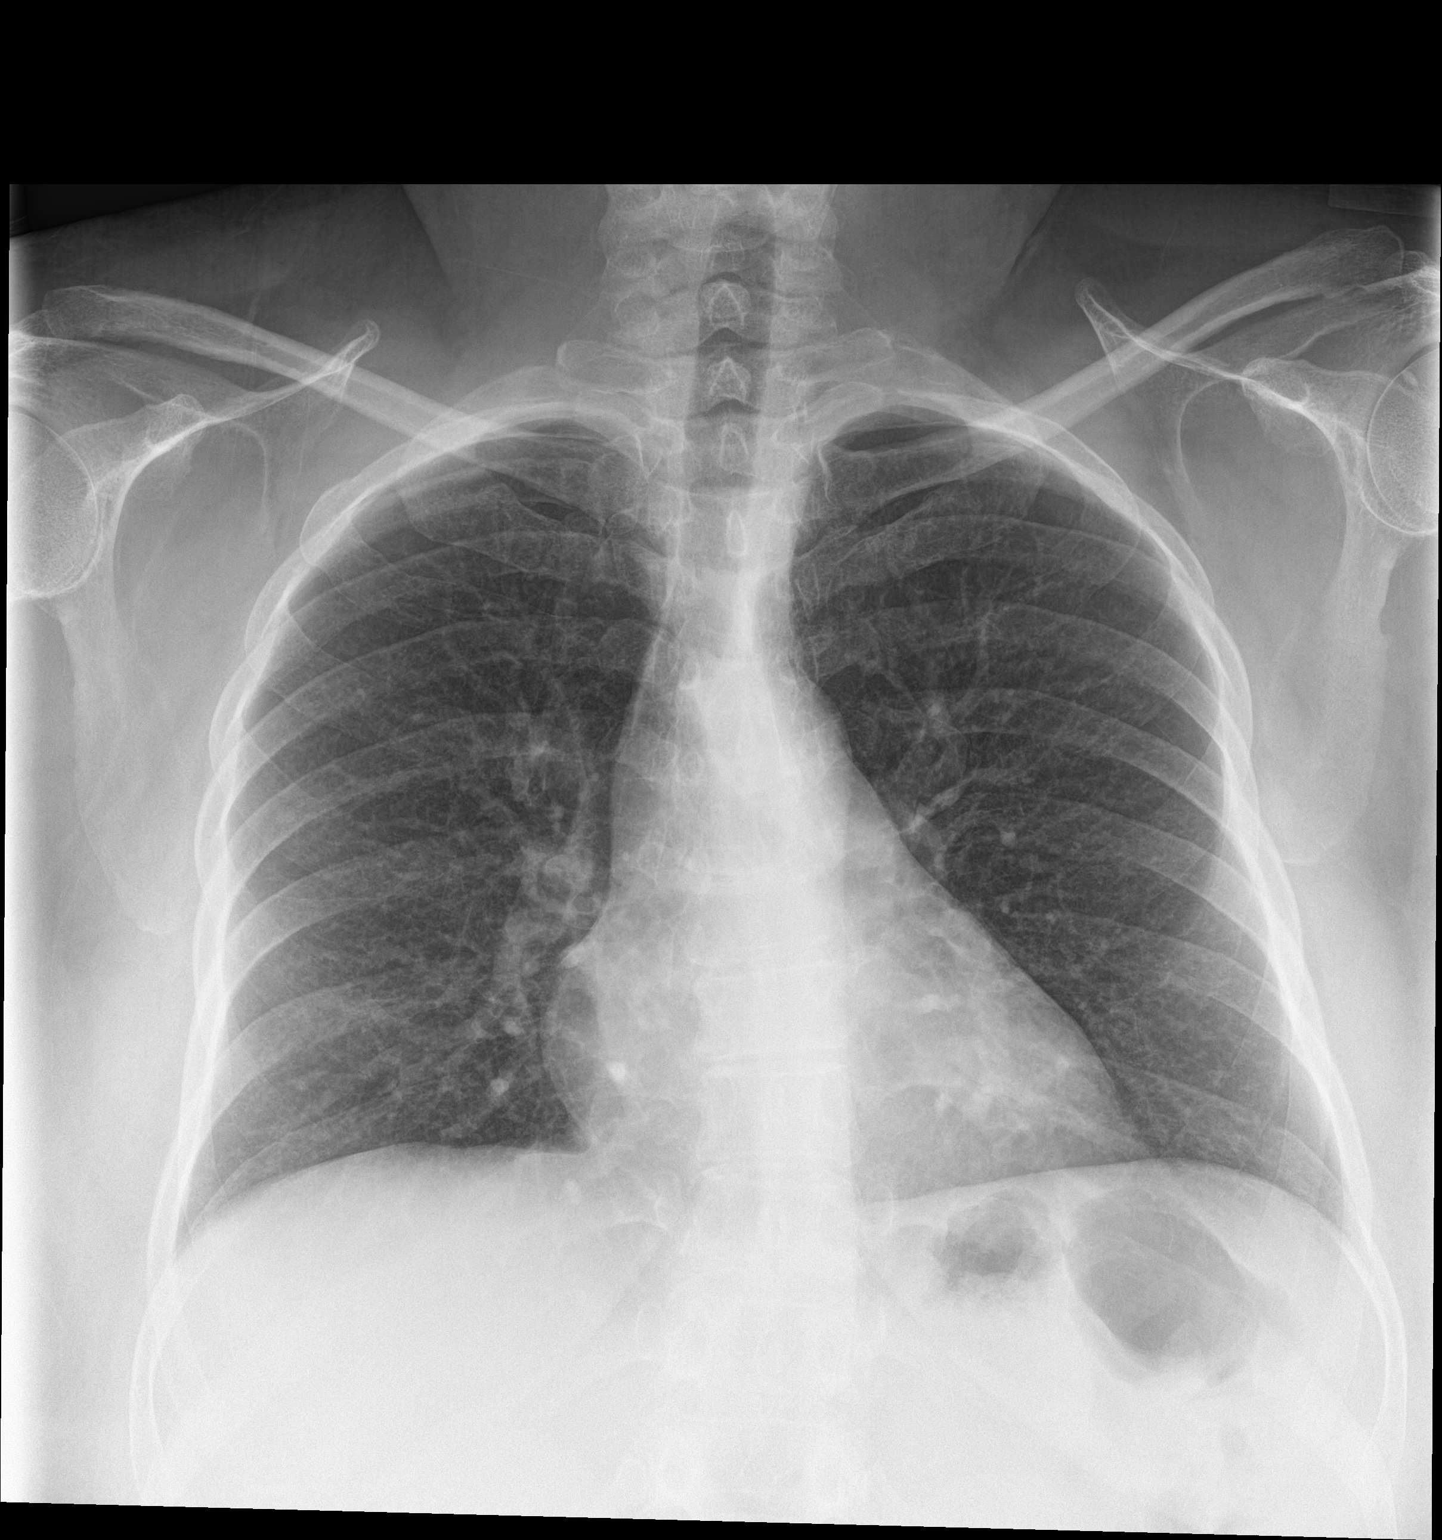

[chest lat]
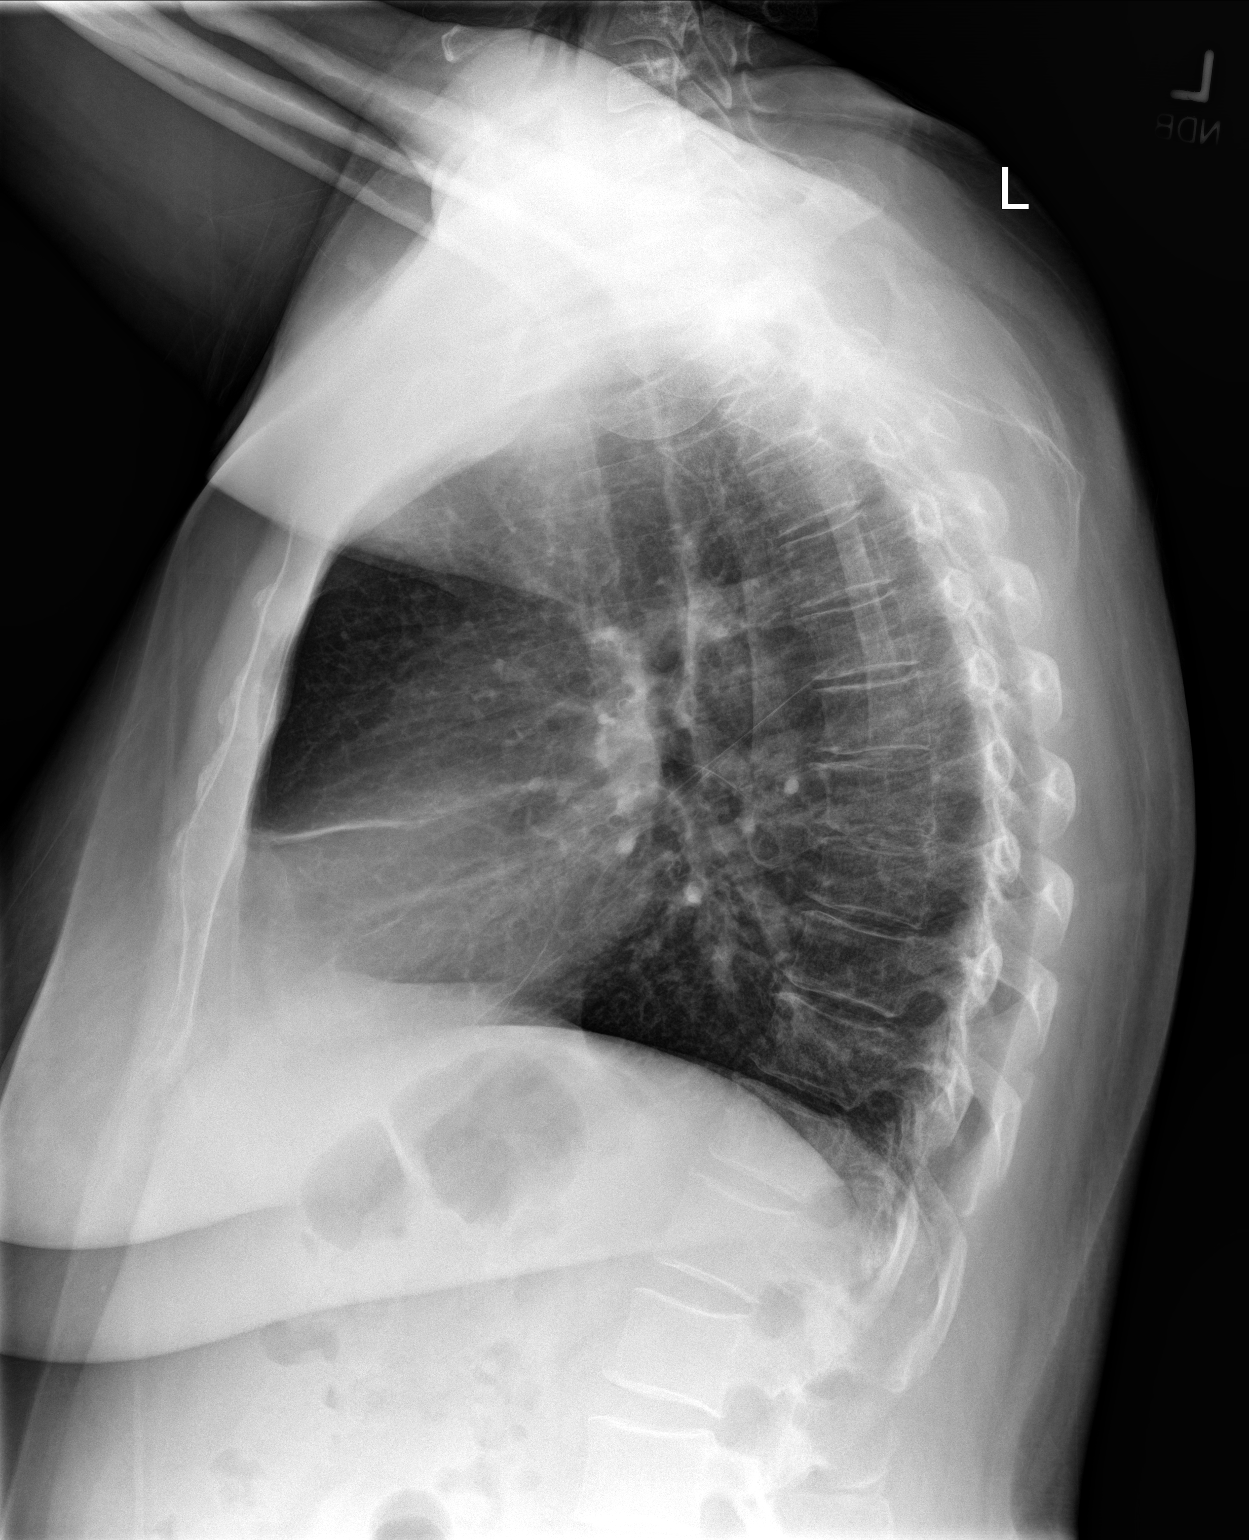

[2 of 2 positions shown; findings below may reference images not displayed]

FINDINGS: The heart size and mediastinal contours are within normal limits.
Both lungs are clear. The visualized skeletal structures are
unremarkable.
IMPRESSION: No active cardiopulmonary disease.

## 2018-01-30 IMAGING — US US TRANSVAGINAL NON-OB
1 series · 15 of 25 positions shown · non-contrast
Comparison: CT of the abdomen and pelvis 09/04/2009

CLINICAL DATA: Malignant neoplasm the uterus. Abnormal uterine
bleeding. Previous C-section, tubal ligation. History of smoking,
diabetes. Gravida 2 para 2. Postmenopausal. Patient does not take
tamoxifen.



[Series 1: us transvaginal non-ob · 139 acquisitions, 15 frames shown]
[im 1/139]
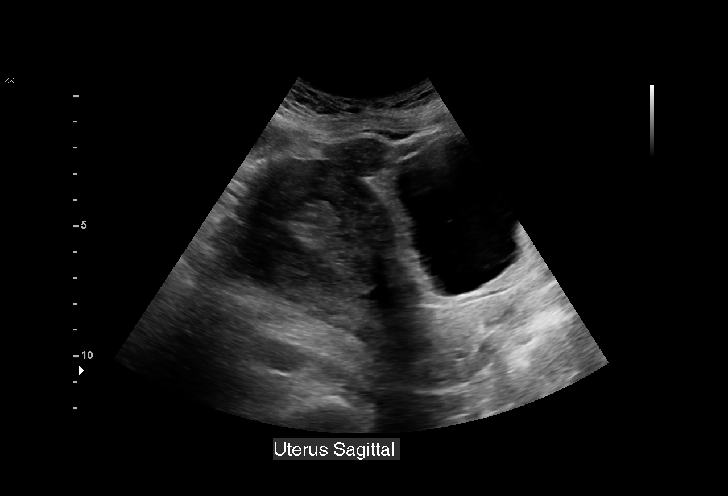
[im 12/139]
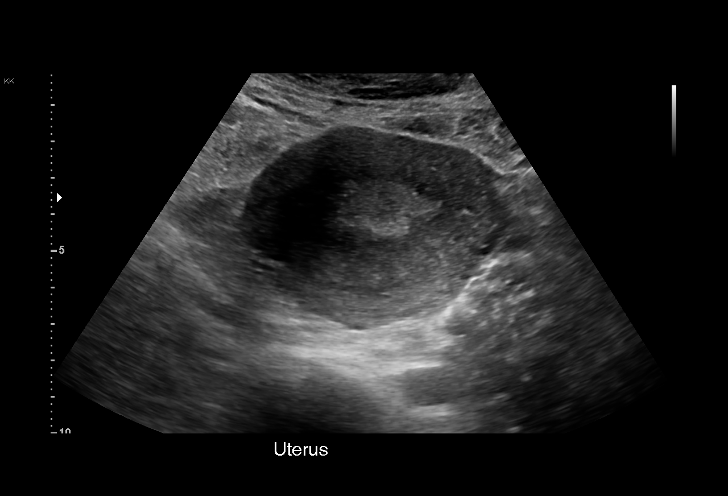
[im 24/139]
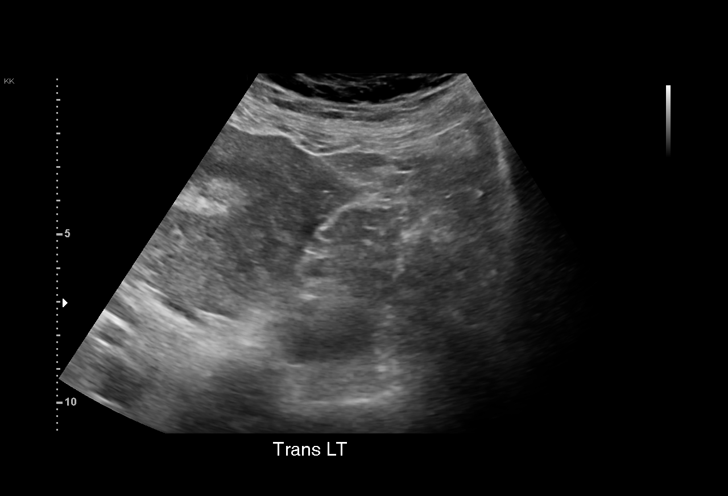
[im 29/139]
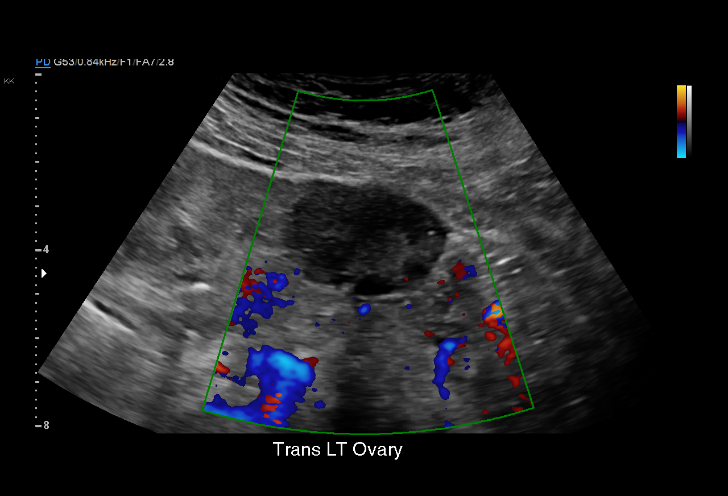
[im 41/139]
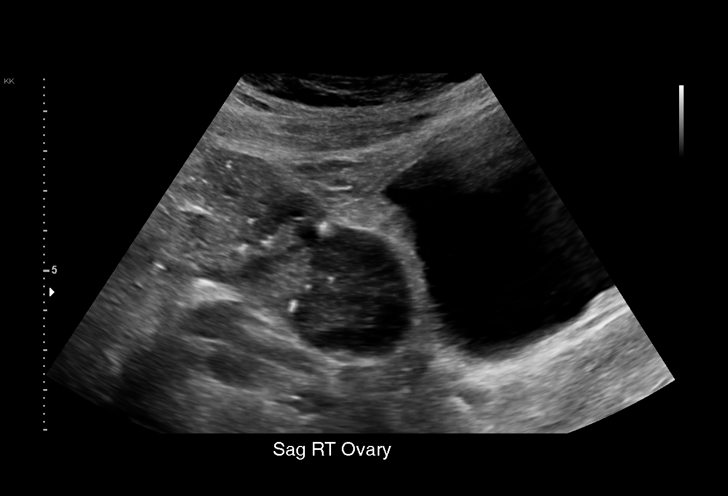
[im 52/139]
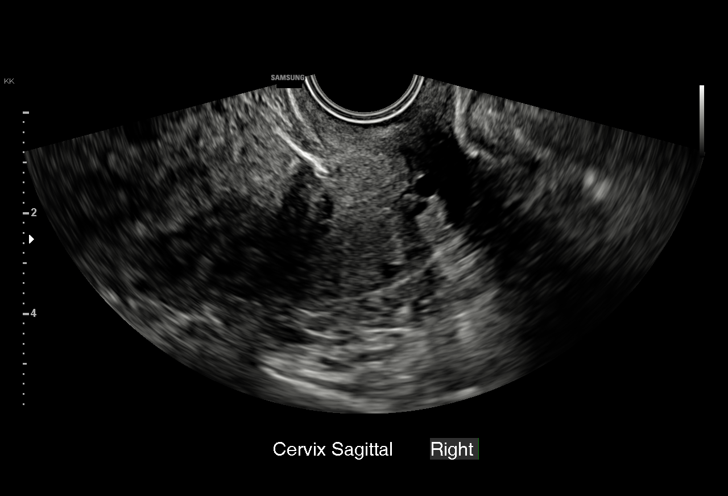
[im 58/139]
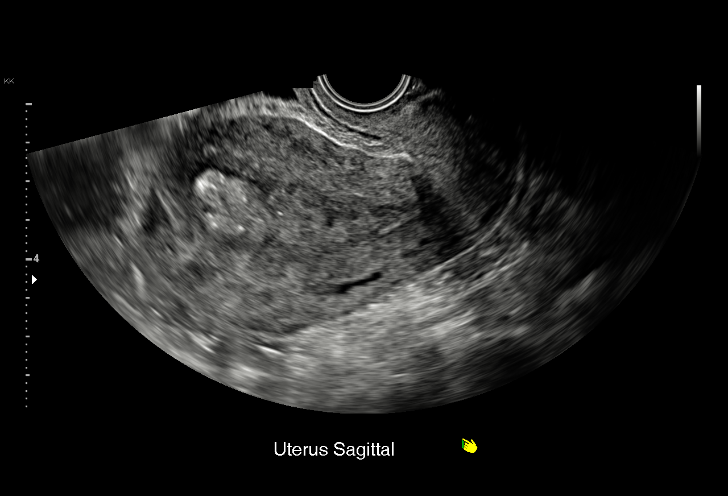
[im 70/139]
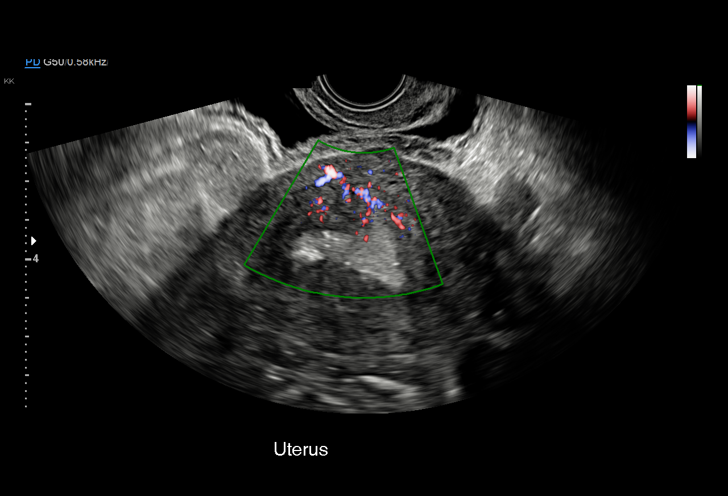
[im 81/139]
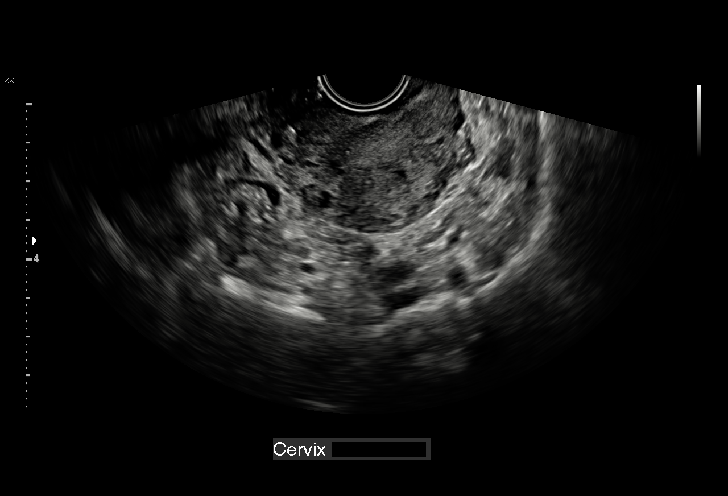
[im 87/139]
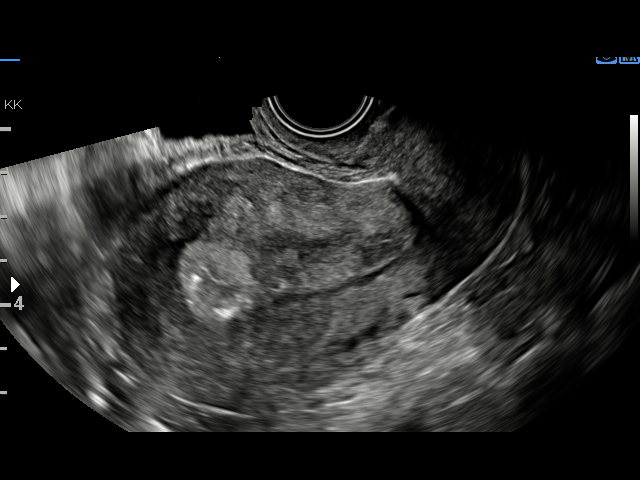
[im 98/139]
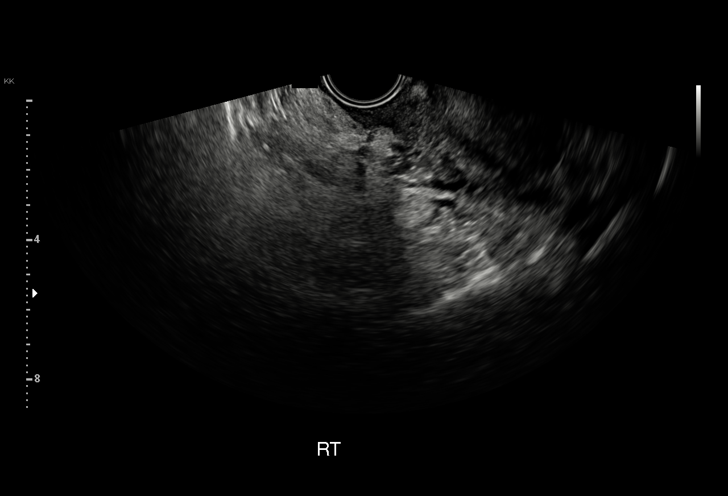
[im 110/139]
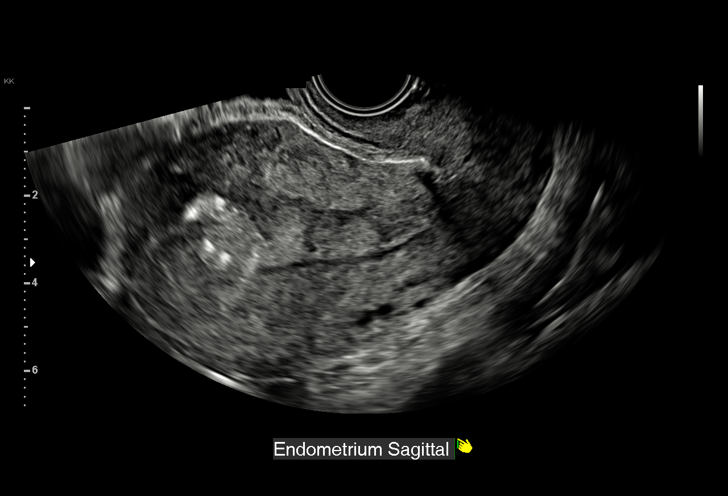
[im 116/139]
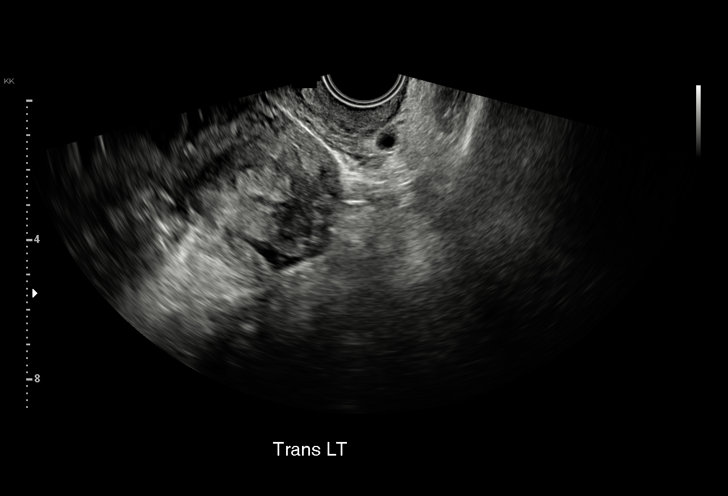
[im 127/139]
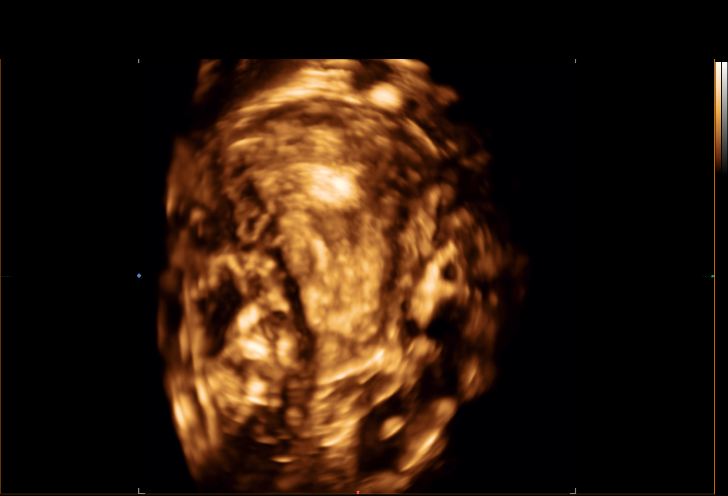
[im 139/139]
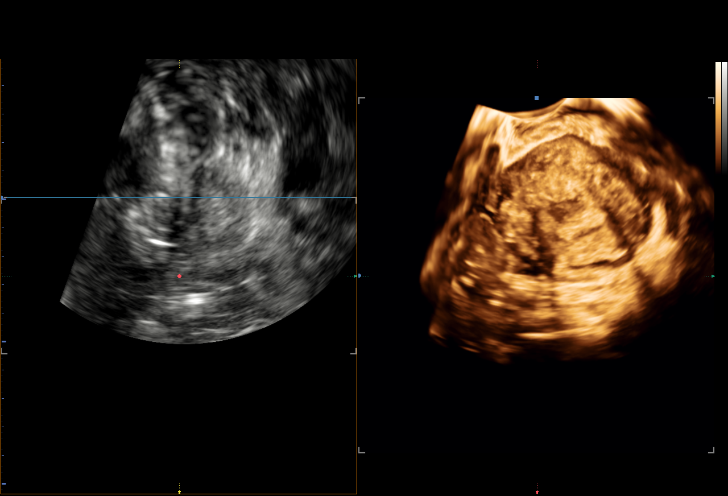

[15 of 25 positions shown; findings below may reference images not displayed]

FINDINGS: Uterus

Measurements: 10.3 x 5.8 x 6.9 cm. Mildly heterogeneous myometrium.
No discrete fibroids are identified. Normal appearance of the
cervix.

Endometrium

Thickness: Thickened, heterogeneous, and echogenic measuring
mm. Portions of the endometrium are vascular and irregular. Question
of myometrial involvement along the left anterior aspect of the
uterus.

Right ovary

Measurements: 3.4 x 3.0 x 2.4 cm. Normal appearance/no adnexal mass.

Left ovary

Measurements: 3.1 x 2.5 x 3.8 cm. Normal appearance/no adnexal mass.

Other findings

No abnormal free fluid.
IMPRESSION: 1. Heterogeneous and thickened endometrial stripe, suspicious for
endometrial hyperplasia or endometrial carcinoma. Question of
myometrial involvement. Endometrial sampling should be considered to
exclude carcinoma if not already performed.
2. No focal abnormalities in either ovary.  No free pelvic fluid.

## 2018-03-24 ENCOUNTER — Emergency Department (HOSPITAL_COMMUNITY)
Admission: EM | Admit: 2018-03-24 | Discharge: 2018-03-25 | Disposition: A | Discharge status: Home / Self Care | Payer: Self-pay | Attending: Emergency Medicine | Admitting: Emergency Medicine

## 2018-03-24 DIAGNOSIS — Z87891 Personal history of nicotine dependence: Secondary | ICD-10-CM | POA: Insufficient documentation

## 2018-03-24 DIAGNOSIS — M5440 Lumbago with sciatica, unspecified side: Secondary | ICD-10-CM | POA: Insufficient documentation

## 2018-03-24 DIAGNOSIS — M62830 Muscle spasm of back: Secondary | ICD-10-CM | POA: Insufficient documentation

## 2018-03-24 DIAGNOSIS — G8929 Other chronic pain: Secondary | ICD-10-CM | POA: Insufficient documentation

## 2018-03-24 DIAGNOSIS — Z8542 Personal history of malignant neoplasm of other parts of uterus: Secondary | ICD-10-CM | POA: Insufficient documentation

## 2018-03-24 DIAGNOSIS — Z79899 Other long term (current) drug therapy: Secondary | ICD-10-CM | POA: Insufficient documentation

## 2018-03-24 DIAGNOSIS — Z794 Long term (current) use of insulin: Secondary | ICD-10-CM | POA: Insufficient documentation

## 2018-03-24 DIAGNOSIS — E119 Type 2 diabetes mellitus without complications: Secondary | ICD-10-CM | POA: Insufficient documentation

## 2018-03-24 NOTE — ED Triage Notes (Signed)
Pt reports lower back pain that has "gotten real bad."  Pt states it takes her "a while to get out of bed."

## 2018-03-25 MED ORDER — KETOROLAC TROMETHAMINE 15 MG/ML IJ SOLN
15.0000 mg | Freq: Once | INTRAMUSCULAR | Status: AC
  Administered 2018-03-25: 15 mg via INTRAVENOUS
  Filled 2018-03-25: qty 1

## 2018-03-25 NOTE — ED Provider Notes (Signed)
[facility] EMERGENCY DEPARTMENT Provider Note   CSN: [id number] Arrival date & time: 03/24/18  2200     History   Chief Complaint Chief Complaint  Patient presents with  . Back Pain    HPI Kristy [name] is a 52 y.o. female.  52 y/o female with a PMH of DM, HLD, chronic back pain presents to the ED with a chief complaint of back pain x multiple years. She reports the pain is mainly on her lower back region and radiates to both of her legs. Patient has tried tylenol but states no relieve in symptoms.She reports the pain is worse with laying flat and has some improvement with walking. She has a a chronic history of back pain and states these flare ups come up from "time to time". She denies any bowel or bladder incontinence, fever, history of IV drug or chest pain.      Past Medical History:  Diagnosis Date  . Anemia   . Cancer ([place])   . Chronic back pain   . Diabetes mellitus type II   . Hyperlipidemia   . Kidney stones   . Uterine cancer [facility])     Patient Active Problem List   Diagnosis Date Noted  . Depressed mood 10/28/2016  . Endometrial cancer ([place]) 07/28/2016  . Abnormal uterine bleeding 12/30/2015  . Chronic left shoulder pain 02/11/2015  . Nail abnormality 09/16/2014  . Left knee pain 06/23/2014  . Microcytic anemia 07/11/2012  . Paresthesia of left leg 07/08/2012  . Low back pain 10/20/2011  . Headache(784.0) 09/24/2011  . GERD (gastroesophageal reflux disease) 09/02/2011  . Chronic back pain 12/02/2010  . Hyperlipidemia 12/01/2010  . DM (diabetes mellitus), type 2, uncontrolled ([place]) 12/01/2010  . Obesity 12/01/2010    Past Surgical History:  Procedure Laterality Date  . ABDOMINAL HYSTERECTOMY    . CESAREAN SECTION    . KIDNEY STONE SURGERY    . NO PAST SURGERIES    . TUBAL LIGATION       OB History    Gravida  2   Para  2   Term  2   Preterm      AB      Living  2     SAB      TAB      Ectopic      Multiple        Live Births               Home Medications    Prior to Admission medications   Medication Sig Start Date End Date Taking? Authorizing Provider  acetaminophen (TYLENOL) 500 MG tablet Take 1,000 mg by mouth every 6 (six) hours as needed for pain.    [provider]  atorvastatin (LIPITOR) 80 MG tablet Take 1 tablet (80 mg total) by mouth daily. 12/17/17   Leeanne Rio, MD  calcium carbonate (TUMS - DOSED IN MG ELEMENTAL CALCIUM) 500 MG chewable tablet Chew 2 tablets by mouth 2 (two) times daily as needed for indigestion or heartburn.    [provider]  insulin glargine (LANTUS) 100 unit/mL SOPN Inject 0.31 mLs (31 Units total) into the skin 2 (two) times daily. 12/17/17   Leeanne Rio, MD  insulin lispro (HUMALOG) 100 UNIT/ML KiwkPen Inject 0.1 mLs (10 Units total) into the skin 2 (two) times daily. 12/17/17   Leeanne Rio, MD  Insulin Pen Needle (BD ULTRA-FINE PEN NEEDLES) 29G X 12.7MM MISC Check blood sugar 4x  daily 03/28/14   Leeanne Rio, MD  meloxicam (MOBIC) 7.5 MG tablet Take 1 tablet (7.5 mg total) by mouth daily as needed for pain. 12/17/17   Leeanne Rio, MD  metFORMIN (GLUCOPHAGE-XR) 500 MG 24 hr tablet Take 1 tablet (500 mg total) by mouth 2 (two) times daily. 06/16/17   Leeanne Rio, MD  nystatin cream (MYCOSTATIN) Apply to affected area 2 times daily 08/31/17   Luvenia Redden, PA-C    Family History Family History  Adopted: Yes  Problem Relation Age of Onset  . Alcohol abuse Neg Hx     Social History Social History   Tobacco Use  . Smoking status: Former Smoker    Packs/day: 0.25    Types: Cigarettes    Last attempt to quit: 10/01/2016    Years since quitting: 1.4  . Smokeless tobacco: Never Used  . Tobacco comment: one cigarette daily  Substance Use Topics  . Alcohol use: Yes    Comment: Once a month  . Drug use: No     Allergies   Fish allergy and Oxycodone   Review of Systems Review of  Systems  Constitutional: Negative for fever.  Respiratory: Negative for shortness of breath.   Cardiovascular: Negative for chest pain.  Musculoskeletal: Positive for back pain.     Physical Exam Updated Vital Signs BP 131/88 (BP Location: Right Arm)   Pulse 81   Temp 98.3 F (36.8 C) (Oral)   Resp 18   LMP 07/14/2016 (Exact Date)   SpO2 98%   Physical Exam  Constitutional: She is oriented to person, place, and time. She appears well-developed and well-nourished.  HENT:  Head: Normocephalic and atraumatic.  Neck: Normal range of motion. Neck supple.  Cardiovascular: Normal heart sounds.  Pulmonary/Chest: Effort normal and breath sounds normal.  Abdominal: Soft.  Musculoskeletal: She exhibits tenderness. She exhibits no deformity.       Lumbar back: She exhibits pain and spasm.  Pain upon palpation of para spinal muscles.   Neurological: She is alert and oriented to person, place, and time.  RLE- KF,KE 5/5 strength LLE- HF, HE 5/5 strength Normal gait. No pronator drift. No leg drop.  Patellar reflexes present and symmetric. CN I, II and VIII not tested. CN II-XII grossly intact bilaterally.    Skin: Skin is warm and dry.  Psychiatric: She has a normal mood and affect.  Nursing note and vitals reviewed.    ED Treatments / Results  Labs (all labs ordered are listed, but only abnormal results are displayed) Labs Reviewed - No data to display  EKG None  Radiology No results found.  Procedures Procedures (including critical care time)  Medications Ordered in ED Medications  ketorolac (TORADOL) 15 MG/ML injection 15 mg (15 mg Intravenous Given 03/25/18 0016)     Initial Impression / Assessment and Plan / ED Course  I have reviewed the triage vital signs and the nursing notes.  Pertinent labs & imaging results that were available during my care of the patient were reviewed by me and considered in my medical decision making (see chart for details).      Presents with chronic back pain. Patient states she has flare ups every so often. Upon examination there is pain with palpation of paraspinal muscles. Normal gait patient is ambulatory. No imaging was ordered as there was no trauma involved and patient has a history of chronic back pain. Patient received toradol while in the ED, she states her pain is relieved.  I have advised patient she needs to follow up with PCP for this chronic pain so she can get a plan together. Return precautions discussed. Patient agrees and understands plan.   Final Clinical Impressions(s) / ED Diagnoses   Final diagnoses:  Chronic low back pain with sciatica, sciatica laterality unspecified, unspecified back pain laterality    ED Discharge Orders    None       Janeece Fitting, PA-C 03/25/18 0111    Fatima Blank, MD 03/25/18 [phone]

## 2018-03-25 NOTE — Discharge Instructions (Signed)
You may alternate ibuprofen or tylenol for your pain. Please follow up with PCP as needed. If you experience any bowel or bladder incontinence, fever please return to the ED for reevaluation.

## 2018-04-14 ENCOUNTER — Other Ambulatory Visit: Payer: Self-pay

## 2018-04-14 ENCOUNTER — Other Ambulatory Visit: Payer: Self-pay | Admitting: Family Medicine

## 2018-04-14 MED ORDER — INSULIN GLARGINE 100 UNITS/ML SOLOSTAR PEN: 31.0000 [IU] | PEN_INJECTOR | Freq: Two times a day (BID) | SUBCUTANEOUS | 0 refills | Status: DC

## 2018-04-15 MED ORDER — INSULIN GLARGINE 100 UNIT/ML SOLOSTAR PEN: 31.0000 [IU] | PEN_INJECTOR | Freq: Two times a day (BID) | SUBCUTANEOUS | 0 refills | Status: DC

## 2018-04-25 ENCOUNTER — Telehealth: Payer: Self-pay | Admitting: Family Medicine

## 2018-04-25 NOTE — Telephone Encounter (Signed)
Pt called because she needs her insulin sent to the health dept. She has the orange card, she said that we keep sending this to [facility]. She needs her insulin can send some to the health dept.

## 2018-04-25 NOTE — Telephone Encounter (Signed)
Called patient and discussed with her in length what she needed. Deleted all pharmacies except [facility].  Called [facility] on [address] to see what medications they had on hold for patient and was told that I would need to call the Health Department to change patients ability to pick up there.  Called Health Dept and left a detailed message and call back number if there are any questions.  Ozella Almond, [place]

## 2018-04-25 NOTE — Telephone Encounter (Signed)
Called patient and left message asking clarification as to what insulin she needs sent to the Health Department and also if she wants [place] to delete all other pharmacies in her file. I am sure that the reason all of her RX's default to [facility] is because she has several pharmacies listed in her chart.  If patient calls back, please verify this information so that we can get her medication to her.  Thanks  .Ozella Almond, CMA

## 2018-04-30 IMAGING — CR DG CHEST 2V
2 series · 2 of 2 positions shown · non-contrast
Comparison: 08/04/2016 chest radiograph

CLINICAL DATA: 50 y/o  F; cough, fever, tachycardia.

EXAM:
CHEST  2 VIEW

[w chest pa]
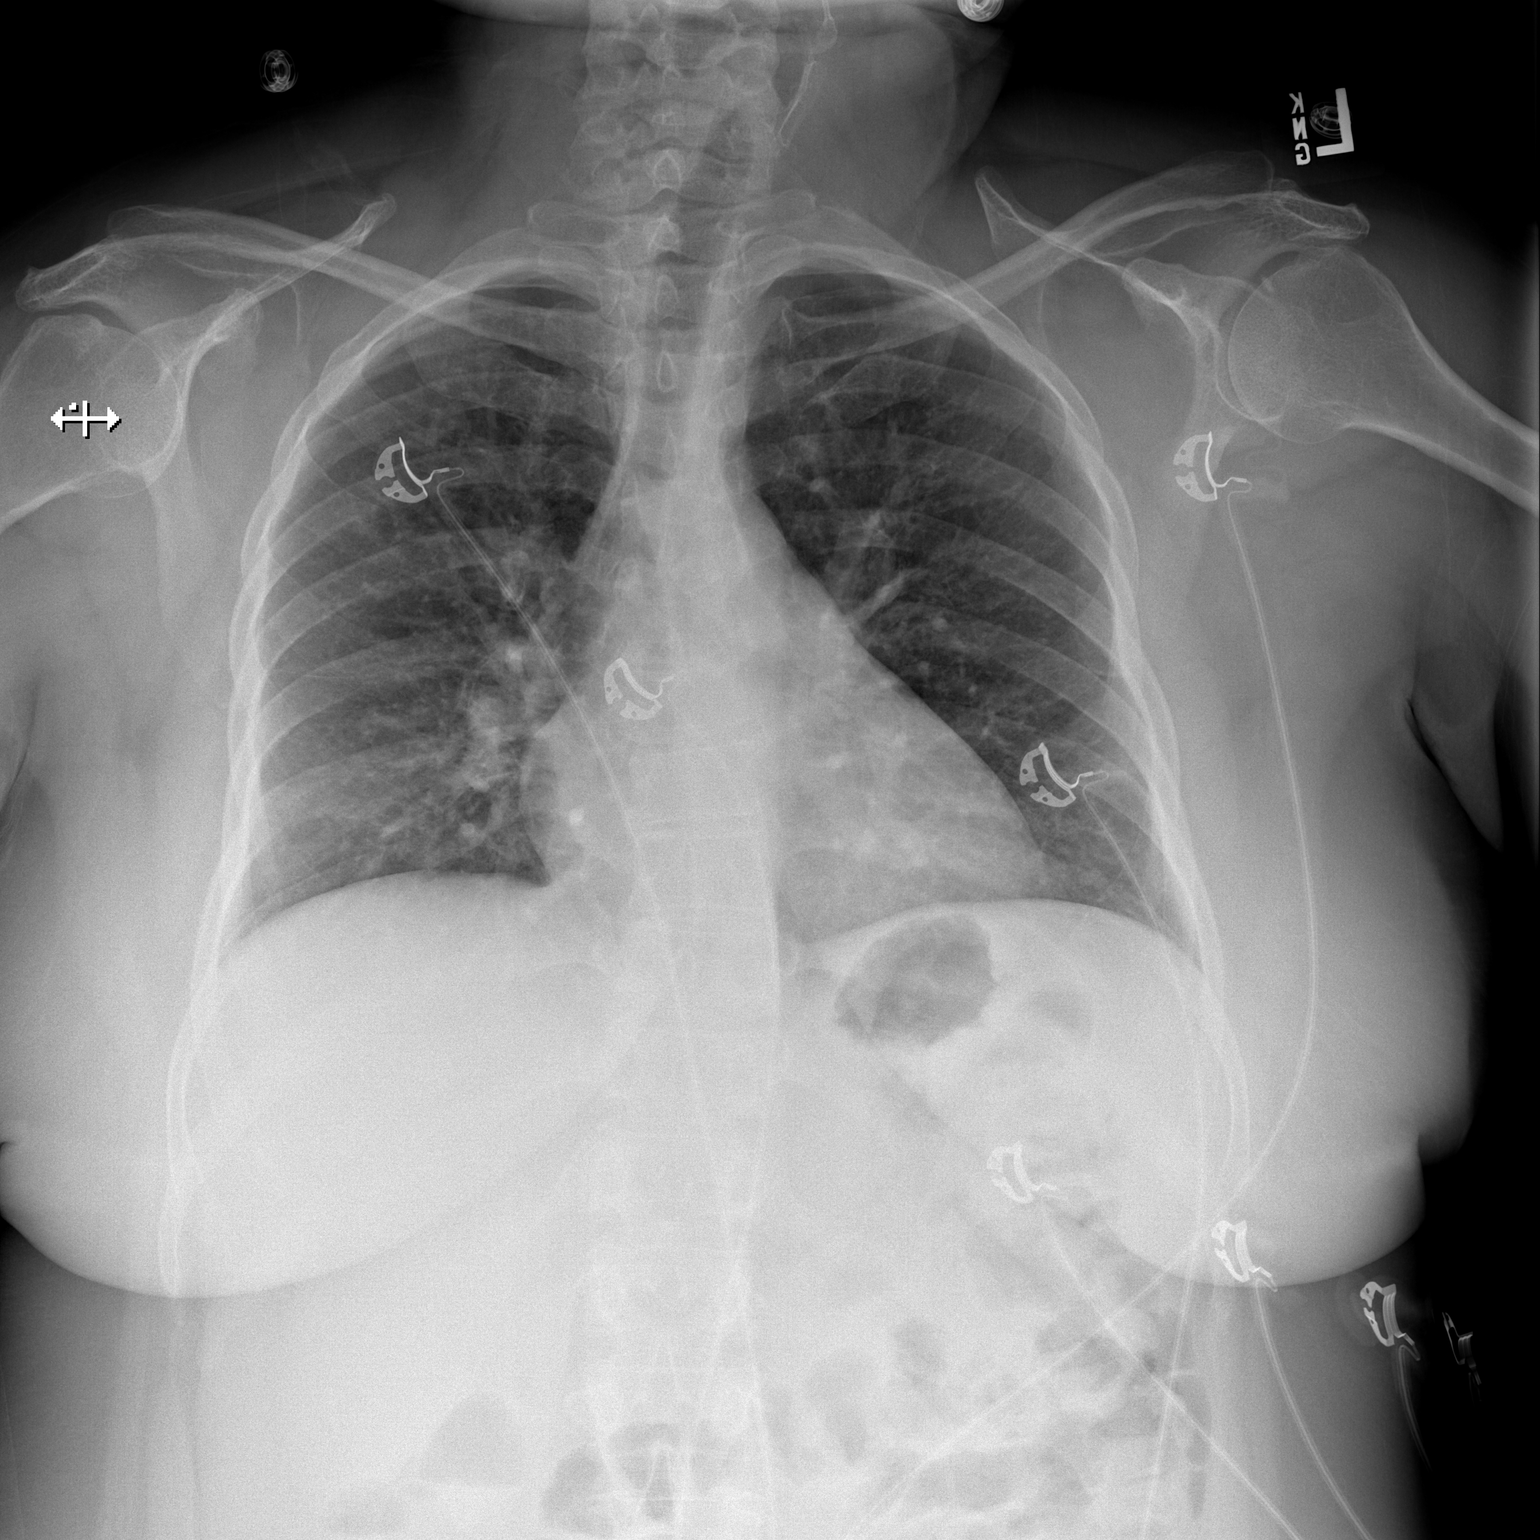

[w chest lat]
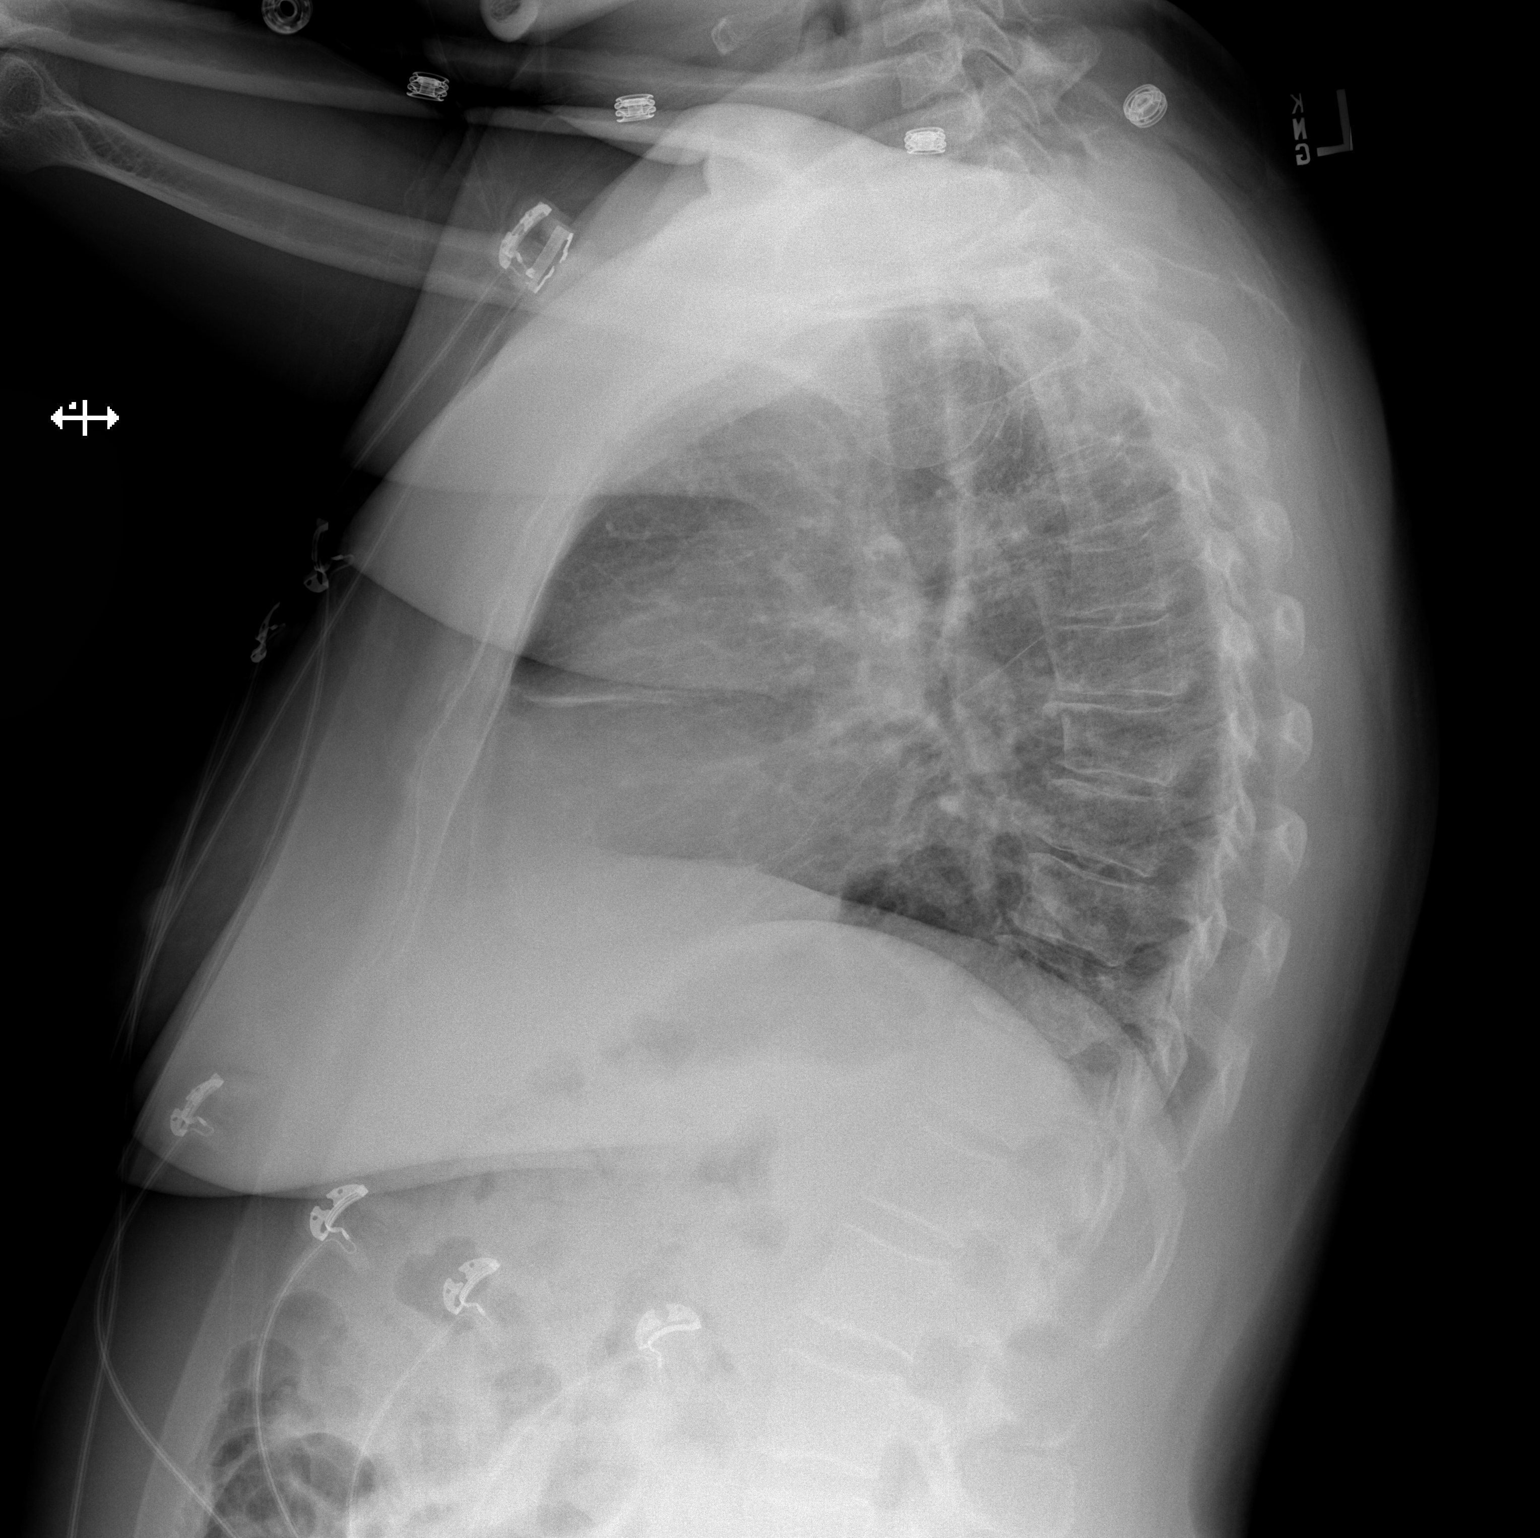

[2 of 2 positions shown; findings below may reference images not displayed]

FINDINGS: Stable normal cardiac silhouette. Mild S-shaped curvature of the
spine. Left lower lung zone platelike atelectasis. No consolidation,
effusion, or pneumothorax.
IMPRESSION: Left lung base platelike atelectasis.  Otherwise clear lungs.

By: Giorgi Jumper M.D.

## 2018-05-06 ENCOUNTER — Encounter: Payer: Self-pay | Admitting: Family Medicine

## 2018-05-06 ENCOUNTER — Ambulatory Visit (INDEPENDENT_AMBULATORY_CARE_PROVIDER_SITE_OTHER): Payer: Self-pay | Admitting: Family Medicine

## 2018-05-06 ENCOUNTER — Other Ambulatory Visit: Payer: Self-pay

## 2018-05-06 VITALS — BP 138/82 | HR 88 | Temp 98.7°F | Ht 59.0 in | Wt 192.6 lb

## 2018-05-06 DIAGNOSIS — G8929 Other chronic pain: Secondary | ICD-10-CM

## 2018-05-06 DIAGNOSIS — E785 Hyperlipidemia, unspecified: Secondary | ICD-10-CM

## 2018-05-06 DIAGNOSIS — E1165 Type 2 diabetes mellitus with hyperglycemia: Secondary | ICD-10-CM

## 2018-05-06 DIAGNOSIS — Z794 Long term (current) use of insulin: Secondary | ICD-10-CM

## 2018-05-06 DIAGNOSIS — M545 Low back pain: Secondary | ICD-10-CM

## 2018-05-06 DIAGNOSIS — Z9189 Other specified personal risk factors, not elsewhere classified: Secondary | ICD-10-CM

## 2018-05-06 LAB — POCT GLYCOSYLATED HEMOGLOBIN (HGB A1C): HbA1c, POC (controlled diabetic range): 11 % — AB (ref 0.0–7.0)

## 2018-05-06 MED ORDER — METFORMIN HCL ER 500 MG PO TB24: 500.0000 mg | ORAL_TABLET | Freq: Two times a day (BID) | ORAL | 3 refills | Status: DC

## 2018-05-06 MED ORDER — ATORVASTATIN CALCIUM 80 MG PO TABS: 80.0000 mg | ORAL_TABLET | Freq: Every day | ORAL | 3 refills | Status: DC

## 2018-05-06 MED ORDER — INSULIN PEN NEEDLE 29G X 12.7MM MISC: 11 refills | Status: AC

## 2018-05-06 MED ORDER — INSULIN LISPRO 100 UNIT/ML (KWIKPEN): 10.0000 [IU] | PEN_INJECTOR | Freq: Two times a day (BID) | SUBCUTANEOUS | 3 refills | Status: AC

## 2018-05-06 MED ORDER — INSULIN PEN NEEDLE 29G X 12.7MM MISC: 11 refills | Status: DC

## 2018-05-06 MED ORDER — INSULIN GLARGINE 100 UNIT/ML SOLOSTAR PEN: 31.0000 [IU] | PEN_INJECTOR | Freq: Two times a day (BID) | SUBCUTANEOUS | 11 refills | Status: DC

## 2018-05-06 MED ORDER — GLUCOSE BLOOD VI STRP: ORAL_STRIP | 12 refills | Status: AC

## 2018-05-06 MED ORDER — INSULIN LISPRO 100 UNIT/ML (KWIKPEN): 10.0000 [IU] | PEN_INJECTOR | Freq: Two times a day (BID) | SUBCUTANEOUS | 3 refills | Status: DC

## 2018-05-06 MED ORDER — INSULIN GLARGINE 100 UNIT/ML SOLOSTAR PEN: 31.0000 [IU] | PEN_INJECTOR | Freq: Two times a day (BID) | SUBCUTANEOUS | 0 refills | Status: DC

## 2018-05-06 MED ORDER — LANCETS MISC: 2 refills | Status: AC

## 2018-05-06 MED ORDER — METFORMIN HCL ER 500 MG PO TB24
500.0000 mg | ORAL_TABLET | Freq: Two times a day (BID) | ORAL | 3 refills | Status: DC
Start: 2018-05-06 — End: 2018-05-06

## 2018-05-06 NOTE — Patient Instructions (Addendum)
It was wonderful to see you today.  Thank you for choosing [facility].   Please call [phone] with any questions about today's appointment.  Please be sure to schedule follow up at the front  desk before you leave today.   Dorris Singh, MD  Family Medicine   FOLLOW UP IN 1 MONTH--Bring all medications with you   Tylenol 1,000 mg THREE TIMES PER DAY for back pain

## 2018-05-06 NOTE — Assessment & Plan Note (Signed)
The patient had a period of 4 to 5 months where she did not have her insulin medications.  She just received both her Lantus and Humalog approximately a month ago she reports.  She has had some trouble affording these medications.  She reports at this time she can afford her medications.  We reviewed the importance of adherence.  Her A1c continues to be elevated.  This is likely due to her difficulty obtaining her medication.  We will continue metformin, Lantus and Humalog with meals at this time.  Patient is on a statin.  The patient is not on an ACE inhibitor as she does not have hypertension.  Could consider addition of aspirin in the future.

## 2018-05-06 NOTE — Progress Notes (Signed)
Patient Name: Adda Stokes Date of Birth: 1965-09-05 Date of Visit: 05/06/18 PCP: Leeanne Rio, MD  Chief Complaint: diabetes check   Subjective: Kelita Wallis is a pleasant 52 y.o. year old with a history of difficult to control type 2 diabetes, dyslipidemia, and chronic back presenting for a number of concerns.   Ms. Dicker reports a many years to back pain.  This limits her ability to walk great distances.  She reports that she has gained weight as she is gotten older than her back pain is worsened.  She denies nocturnal pain which wakes her up, bowel or bladder dysfunction, numbness or weakness in her legs.  Her back pain is unchanged.  At times it flares.  She was recently seen in the emergency department for this.  She reports Tylenol resolves her back pain to some degree.  She at one point tried 1 of her brothers Percocets which she reports made her loopy.  She request something stronger than Tylenol today.  Ms. Degenhart reports continued issues with her medications.  She is taking her atorvastatin intermittently.  She misses her Lantus on average 3-4 times a week.  She is to take 31 units twice daily.  She reports due to low appetite she misses her Humalog dose several times a day.  She is to take 10 units twice a day.  She is taking her metformin twice daily when she remembers to take this   Ms. Garlitz had a mammogram ordered at her last visit.  She has not yet obtained this.  Ms. Previti had a colonoscopy ordered at her last visit.  She has not yet undergone this.  She has a phone number to call for this.  ROS:  ROS Negative for chest pains, difficulty breathing, signs or symptoms of hyper or hypoglycemia.  I have reviewed the patient's medical, surgical, family, and social history as appropriate.   Vitals:   05/06/18 0850  BP: 138/82  Pulse: 88  Temp: 98.7 F (37.1 C)  SpO2: 98%   Filed Weights   05/06/18 0850  Weight: 192 lb 9.6 oz (87.4 kg)   HEENT: Sclera anicteric.  Dentition is poor. Appears well hydrated. Neck: Supple Cardiac: Regular rate and rhythm. Normal S1/S2. No murmurs, rubs, or gallops appreciated. Lungs: Clear bilaterally to ascultation.  Abdomen: Normoactive bowel sounds. No tenderness to deep or light palpation. No rebound or guarding.  Musculoskeletal: Back exam with mild tenderness to palpation along paraspinal muscles of lumbar spine.  Negative straight leg raise bilaterally she has some proximal muscle weakness as evidenced by her difficulty getting up from a chair without using her upper extremities.  Gait is slow but not antalgic.  Tonique was seen today for follow-up.  Diagnoses and all orders for this visit:  Type 2 diabetes mellitus with hyperglycemia, with long-term current use of insulin ([place]) the patient's hemoglobin A1c is not at goal.  Her A1c is 11.0 today. -     HgB A1c -     atorvastatin (LIPITOR) 80 MG tablet; Take 1 tablet (80 mg total) by mouth daily. -     metFORMIN (GLUCOPHAGE-XR) 500 MG 24 hr tablet; Take 1 tablet (500 mg total) by mouth 2 (two) times daily. -     Insulin Pen Needle (BD ULTRA-FINE PEN NEEDLES) 29G X 12.7MM MISC; Check blood sugar 4x daily -     insulin lispro (HUMALOG) 100 UNIT/ML KiwkPen; Inject 0.1 mLs (10 Units total) into the skin 2 (two) times daily. -  Insulin Glargine (LANTUS SOLOSTAR) 100 UNIT/ML Solostar Pen; Inject 31 Units into the skin 2 (two) times daily. - Consider addition of aspirin in the future.  The patient is due for a foot and eye exam.  She will think about an eye appointment in the future.  Dyslipidemia -     Lipid Panel  Chronic bilateral low back pain without sciatica reviewed her previous x-rays.  She has never had an MRI.  I suspect this is due to degenerative disc disease in the setting of her weight gain and obesity.  We discussed the importance of remaining physically active.  She is amenable to a referral to physical therapy.  Differential for cause of back pain include  spinal stenosis as well as nerve root impingement.  Given her pain is controlled with Tylenol we discussed that this medication is both safe and efficacious in the long-term.  Recommended Acetaminophen  three times a day rather than once per day discussed that I will not be prescribing narcotic medications for her pain, particularly in the setting of her use of other family members narcotic medications. -     Ambulatory referral to Physical Therapy - Could consider further imaging with MRI and/or referral to orthopedic spine surgery in the future  Healthcare Maintenance Influenza vaccine not given today due to the patient's insurance.  Recommended she go to the health department for this.  We discussed the importance of a mammogram and colonoscopy.  She will call to schedule both of these.  Medication Non-adherence we discussed the difficulty with adherence to her medication regimen.  She has a number of barriers including transportation, finances and availability of her medications.  At this time she reports she can afford all her medications.  She has been on.  She does need refills on all of these which were sent to her pharmacy again today.  I suspect her difficulties with finances and transportation is limited her ability to routinely take her medications.  We will follow-up in 1 month to address foot and eye exam as well as ensure adherence to medications given her difficulty with this in the past.  Dorris Singh, MD  [facility] Medicine Teaching Service

## 2018-05-07 LAB — LIPID PANEL
CHOL/HDL RATIO: 7.5 ratio — AB (ref 0.0–4.4)
Cholesterol, Total: 233 mg/dL — ABNORMAL HIGH (ref 100–199)
HDL: 31 mg/dL — AB (ref >39)
LDL Calculated: 159 mg/dL — ABNORMAL HIGH (ref 0–99)
Triglycerides: 217 mg/dL — ABNORMAL HIGH (ref 0–149)
VLDL CHOLESTEROL CAL: 43 mg/dL — AB (ref 5–40)

## 2018-06-06 ENCOUNTER — Ambulatory Visit: Payer: Self-pay | Admitting: Family Medicine

## 2018-06-13 ENCOUNTER — Ambulatory Visit: Payer: Self-pay | Admitting: Family Medicine

## 2018-06-27 ENCOUNTER — Ambulatory Visit: Payer: Self-pay | Admitting: Family Medicine

## 2018-08-01 ENCOUNTER — Ambulatory Visit: Payer: Self-pay | Admitting: Family Medicine

## 2018-09-20 ENCOUNTER — Ambulatory Visit: Payer: BLUE CROSS/BLUE SHIELD | Admitting: Family Medicine

## 2019-03-30 ENCOUNTER — Other Ambulatory Visit: Payer: Self-pay

## 2019-03-30 DIAGNOSIS — E1165 Type 2 diabetes mellitus with hyperglycemia: Secondary | ICD-10-CM

## 2019-03-30 DIAGNOSIS — Z794 Long term (current) use of insulin: Secondary | ICD-10-CM

## 2019-03-30 MED ORDER — LANTUS SOLOSTAR 100 UNIT/ML ~~LOC~~ SOPN: 31.0000 [IU] | PEN_INJECTOR | Freq: Two times a day (BID) | SUBCUTANEOUS | 11 refills | Status: AC

## 2019-03-30 NOTE — Telephone Encounter (Signed)
Please schedule patient for office visit with PCP.

## 2019-05-09 ENCOUNTER — Other Ambulatory Visit: Payer: Self-pay

## 2019-05-10 NOTE — Telephone Encounter (Signed)
Reviewed record in Care Everywhere. Patient has not been seen here in over a year now. It appears she has established with a [facility] PCP who is now managing diabetes. Will change PCP to that provider. Will not refill this medication.  Leeanne Rio, MD

## 2019-06-06 ENCOUNTER — Other Ambulatory Visit: Payer: Self-pay

## 2019-06-06 ENCOUNTER — Emergency Department (HOSPITAL_COMMUNITY)
Admission: EM | Admit: 2019-06-06 | Discharge: 2019-06-06 | Discharge status: Against Medical Advice | Payer: BLUE CROSS/BLUE SHIELD | Attending: Emergency Medicine | Admitting: Emergency Medicine

## 2019-06-06 ENCOUNTER — Encounter (HOSPITAL_COMMUNITY): Payer: Self-pay | Admitting: Emergency Medicine

## 2019-06-06 DIAGNOSIS — Z5321 Procedure and treatment not carried out due to patient leaving prior to being seen by health care provider: Secondary | ICD-10-CM | POA: Insufficient documentation

## 2019-06-06 DIAGNOSIS — R110 Nausea: Secondary | ICD-10-CM | POA: Diagnosis present

## 2019-06-06 LAB — CBG MONITORING, ED: Glucose-Capillary: 184 mg/dL — ABNORMAL HIGH (ref 70–99)

## 2019-06-06 LAB — URINALYSIS, ROUTINE W REFLEX MICROSCOPIC
Bilirubin Urine: NEGATIVE
Glucose, UA: 150 mg/dL — AB
Ketones, ur: NEGATIVE mg/dL
Nitrite: NEGATIVE
Protein, ur: NEGATIVE mg/dL
Specific Gravity, Urine: 1.024 (ref 1.005–1.030)
WBC, UA: >50 WBC/hpf — ABNORMAL HIGH (ref 0–5)
pH: 5 (ref 5.0–8.0)

## 2019-06-06 LAB — COMPREHENSIVE METABOLIC PANEL
ALT: 15 U/L (ref 0–44)
AST: 15 U/L (ref 15–41)
Albumin: 3.6 g/dL (ref 3.5–5.0)
Alkaline Phosphatase: 108 U/L (ref 38–126)
Anion gap: 9 (ref 5–15)
BUN: 16 mg/dL (ref 6–20)
CO2: 24 mmol/L (ref 22–32)
Calcium: 9.6 mg/dL (ref 8.9–10.3)
Chloride: 103 mmol/L (ref 98–111)
Creatinine, Ser: 0.79 mg/dL (ref 0.44–1.00)
GFR calc Af Amer: >60 mL/min (ref >60)
GFR calc non Af Amer: >60 mL/min (ref >60)
Glucose, Bld: 200 mg/dL — ABNORMAL HIGH (ref 70–99)
Potassium: 3.9 mmol/L (ref 3.5–5.1)
Sodium: 136 mmol/L (ref 135–145)
Total Bilirubin: 0.9 mg/dL (ref 0.3–1.2)
Total Protein: 7.6 g/dL (ref 6.5–8.1)

## 2019-06-06 LAB — CBC
HCT: 39.2 % (ref 36.0–46.0)
Hemoglobin: 13.7 g/dL (ref 12.0–15.0)
MCH: 26.6 pg (ref 26.0–34.0)
MCHC: 34.9 g/dL (ref 30.0–36.0)
MCV: 76.1 fL — ABNORMAL LOW (ref 80.0–100.0)
Platelets: 307 10*3/uL (ref 150–400)
RBC: 5.15 MIL/uL — ABNORMAL HIGH (ref 3.87–5.11)
RDW: 14.4 % (ref 11.5–15.5)
WBC: 8 10*3/uL (ref 4.0–10.5)
nRBC: 0 % (ref 0.0–0.2)

## 2019-06-06 LAB — LIPASE, BLOOD: Lipase: 24 U/L (ref 11–51)

## 2019-06-06 MED ORDER — SODIUM CHLORIDE 0.9% FLUSH: 3.0000 mL | Freq: Once | INTRAVENOUS | Status: DC

## 2019-06-06 MED ORDER — ONDANSETRON 4 MG PO TBDP: 4.0000 mg | ORAL_TABLET | Freq: Once | ORAL | Status: DC | PRN

## 2019-06-06 NOTE — ED Triage Notes (Signed)
Patient coming from work, states that she thought that her sugar was up, states that the CBG reading is good for her, she is usually higher.  She states she is nauseated, has not vomited and no abdominal or chest pain.  Patient states she just isn't feeling herself.

## 2019-06-07 LAB — URINE CULTURE

## 2019-09-05 ENCOUNTER — Emergency Department (HOSPITAL_COMMUNITY): Payer: BLUE CROSS/BLUE SHIELD

## 2019-09-05 ENCOUNTER — Encounter (HOSPITAL_COMMUNITY): Payer: Self-pay

## 2019-09-05 ENCOUNTER — Emergency Department (HOSPITAL_COMMUNITY)
Admission: EM | Admit: 2019-09-05 | Discharge: 2019-09-05 | Disposition: A | Discharge status: Home / Self Care | Payer: BLUE CROSS/BLUE SHIELD | Attending: Emergency Medicine | Admitting: Emergency Medicine

## 2019-09-05 ENCOUNTER — Other Ambulatory Visit: Payer: Self-pay

## 2019-09-05 DIAGNOSIS — Z87891 Personal history of nicotine dependence: Secondary | ICD-10-CM | POA: Insufficient documentation

## 2019-09-05 DIAGNOSIS — B070 Plantar wart: Secondary | ICD-10-CM | POA: Insufficient documentation

## 2019-09-05 DIAGNOSIS — Z794 Long term (current) use of insulin: Secondary | ICD-10-CM | POA: Insufficient documentation

## 2019-09-05 DIAGNOSIS — Z79899 Other long term (current) drug therapy: Secondary | ICD-10-CM | POA: Insufficient documentation

## 2019-09-05 DIAGNOSIS — E119 Type 2 diabetes mellitus without complications: Secondary | ICD-10-CM | POA: Insufficient documentation

## 2019-09-05 DIAGNOSIS — M79671 Pain in right foot: Secondary | ICD-10-CM | POA: Diagnosis present

## 2019-09-05 NOTE — ED Triage Notes (Signed)
Pt reports R foot pain x 2 weeks. Pain is in the ball of her foot. She stats that tonight, she dropped a box on that foot and now the pain is worse. A&Ox4. Ambulatory.

## 2019-09-05 NOTE — Discharge Instructions (Signed)
Contact a health care provider if: Your warts do not improve after treatment. You have redness, swelling, or pain at the site of a wart. You have bleeding from a wart that does not stop with light pressure. You have diabetes and you develop a wart.

## 2019-09-05 NOTE — ED Provider Notes (Signed)
[facility] DEPT Provider Note   CSN: [id number] Arrival date & time: 09/05/19  0232     History Chief Complaint  Patient presents with  . Foot Pain    Kristy [name] is a 54 y.o. female presents emergency department with chief complaint of right foot pain.  Patient complains of pain on the plantar surface of the foot which has been going on for several months.  She states that it hurts every time she bears weight on the foot.  Pain is equal in severity all the time not worse after rest.  Today she dropped heavy object on her foot and states that the pain she was having on the plantar surface was worse because of it.  She is taken Tylenol without relief.  She is not had it evaluated previously  HPI     Past Medical History:  Diagnosis Date  . Anemia   . Cancer ([place])   . Chronic back pain   . Diabetes mellitus type II   . Hyperlipidemia   . Kidney stones   . Uterine cancer [facility])     Patient Active Problem List   Diagnosis Date Noted  . Depressed mood 10/28/2016  . Endometrial cancer ([place]) 07/28/2016  . Abnormal uterine bleeding 12/30/2015  . Chronic left shoulder pain 02/11/2015  . Nail abnormality 09/16/2014  . Left knee pain 06/23/2014  . Microcytic anemia 07/11/2012  . Paresthesia of left leg 07/08/2012  . Low back pain 10/20/2011  . Headache(784.0) 09/24/2011  . GERD (gastroesophageal reflux disease) 09/02/2011  . Chronic back pain 12/02/2010  . Hyperlipidemia 12/01/2010  . DM (diabetes mellitus), type 2, uncontrolled ([place]) 12/01/2010  . Obesity 12/01/2010    Past Surgical History:  Procedure Laterality Date  . ABDOMINAL HYSTERECTOMY    . CESAREAN SECTION    . KIDNEY STONE SURGERY    . NO PAST SURGERIES    . TUBAL LIGATION       OB History    Gravida  2   Para  2   Term  2   Preterm      AB      Living  2     SAB      TAB      Ectopic      Multiple      Live Births              Family History    Adopted: Yes  Problem Relation Age of Onset  . Alcohol abuse Neg Hx     Social History   Tobacco Use  . Smoking status: Former Smoker    Packs/day: 0.25    Types: Cigarettes    Quit date: 10/01/2016    Years since quitting: 2.9  . Smokeless tobacco: Never Used  . Tobacco comment: one cigarette daily  Substance Use Topics  . Alcohol use: Yes    Comment: Once a month  . Drug use: No    Home Medications Prior to Admission medications   Medication Sig Start Date End Date Taking? Authorizing Provider  acetaminophen (TYLENOL) 500 MG tablet Take 1,000 mg by mouth every 6 (six) hours as needed for pain.    [provider]  atorvastatin (LIPITOR) 80 MG tablet Take 1 tablet (80 mg total) by mouth daily. 05/06/18   Martyn Malay, MD  calcium carbonate (TUMS - DOSED IN MG ELEMENTAL CALCIUM) 500 MG chewable tablet Chew 2 tablets by mouth 2 (two) times daily as needed for indigestion  or heartburn.    [provider]  glucose blood (AGAMATRIX PRESTO TEST) test strip Use as instructed 05/06/18   Martyn Malay, MD  Insulin Glargine (LANTUS SOLOSTAR) 100 UNIT/ML Solostar Pen Inject 31 Units into the skin 2 (two) times daily. 03/30/19   Martyn Malay, MD  insulin lispro (HUMALOG) 100 UNIT/ML KiwkPen Inject 0.1 mLs (10 Units total) into the skin 2 (two) times daily. 05/06/18   Martyn Malay, MD  Insulin Pen Needle (BD ULTRA-FINE PEN NEEDLES) 29G X 12.7MM MISC Check blood sugar 4x daily 05/06/18   Martyn Malay, MD  Lancets MISC Check BG prior to administering insulin 05/06/18   Martyn Malay, MD  metFORMIN (GLUCOPHAGE-XR) 500 MG 24 hr tablet Take 1 tablet (500 mg total) by mouth 2 (two) times daily. 05/06/18   Martyn Malay, MD    Allergies    Fish allergy and Oxycodone  Review of Systems   Review of Systems Ten systems reviewed and are negative for acute change, except as noted in the HPI.   Physical Exam Updated Vital Signs BP 116/72 (BP Location: Right Arm)    Pulse 94   Temp 98.6 F (37 C) (Oral)   Resp 19   Ht 4\' 11"  (1.499 m)   Wt 81.6 kg   LMP 07/14/2016 (Exact Date)   SpO2 96%   BMI 36.36 kg/m   Physical Exam Vitals and nursing note reviewed.  Constitutional:      General: She is not in acute distress.    Appearance: She is well-developed. She is not diaphoretic.  HENT:     Head: Normocephalic and atraumatic.  Eyes:     General: No scleral icterus.    Conjunctiva/sclera: Conjunctivae normal.  Cardiovascular:     Rate and Rhythm: Normal rate and regular rhythm.     Heart sounds: Normal heart sounds. No murmur. No friction rub. No gallop.   Pulmonary:     Effort: Pulmonary effort is normal. No respiratory distress.     Breath sounds: Normal breath sounds.  Abdominal:     General: Bowel sounds are normal. There is no distension.     Palpations: Abdomen is soft. There is no mass.     Tenderness: There is no abdominal tenderness. There is no guarding.  Musculoskeletal:     Cervical back: Normal range of motion.       Feet:  Skin:    General: Skin is warm and dry.  Neurological:     Mental Status: She is alert and oriented to person, place, and time.  Psychiatric:        Behavior: Behavior normal.     ED Results / Procedures / Treatments   Labs (all labs ordered are listed, but only abnormal results are displayed) Labs Reviewed - No data to display  EKG None  Radiology DG Foot Complete Right  Result Date: 09/05/2019 CLINICAL DATA:  Right foot pain, pain at the base of the heel for 2 weeks EXAM: RIGHT FOOT COMPLETE - 3+ VIEW COMPARISON:  Radiograph 01/17/2017 FINDINGS: No acute osseous abnormality or suspicious osseous lesion. Remote posttraumatic deformity fourth proximal phalanx. Accessory navicular and os peroneum are present. There is enthesopathic spurring at the base of the fifth metatarsal as well as bidirectional calcaneal spurs. Question some mild pes planus though this is incompletely assessed on  nonweightbearing radiographs. Sclerotic features of the medial and lateral hallux sesamoids with pseudoarticulation may reflect sequela of prior sesamoiditis overall similar appearance to comparison study.  No worrisome osseous lesions. Minimal vascular calcium in the soft tissues. Soft tissues are otherwise unremarkable. IMPRESSION: 1. No acute osseous abnormality. 2. Bidirectional calcaneal spurs are noted. 3. Enthesopathic change at the base of fifth metatarsal. 4. Sclerotic features of the medial and lateral hallux sesamoids with pseudoarticulation may reflect sequela of prior sesamoiditis. Electronically Signed   By: Lovena Le M.D.   On: 09/05/2019 03:27    Procedures Procedures (including critical care time)  Medications Ordered in ED Medications - No data to display  ED Course  I have reviewed the triage vital signs and the nursing notes.  Pertinent labs & imaging results that were available during my care of the patient were reviewed by me and considered in my medical decision making (see chart for details).    MDM Rules/Calculators/A&P                     With a small plantar wart in the right midfoot region.  This is the source of her pain.  I personally reviewed the patient's right foot x-ray which shows some chronic abnormalities but no acute fractures or dislocations.  She is ambulatory and appears appropriate for outpatient follow-up with podiatry.  Discussed return precautions Final Clinical Impression(s) / ED Diagnoses Final diagnoses:  Plantar wart, right foot    Rx / DC Orders ED Discharge Orders    None       Margarita Mail, PA-C 09/05/19 0427    Merrily Pew, MD 09/05/19 [phone]

## 2019-10-02 ENCOUNTER — Emergency Department (HOSPITAL_COMMUNITY)
Admission: EM | Admit: 2019-10-02 | Discharge: 2019-10-03 | Disposition: A | Discharge status: Home / Self Care | Payer: BLUE CROSS/BLUE SHIELD | Attending: Emergency Medicine | Admitting: Emergency Medicine

## 2019-10-02 ENCOUNTER — Encounter (HOSPITAL_COMMUNITY): Payer: Self-pay | Admitting: Emergency Medicine

## 2019-10-02 ENCOUNTER — Other Ambulatory Visit: Payer: Self-pay

## 2019-10-02 DIAGNOSIS — Z794 Long term (current) use of insulin: Secondary | ICD-10-CM | POA: Diagnosis not present

## 2019-10-02 DIAGNOSIS — Z79899 Other long term (current) drug therapy: Secondary | ICD-10-CM | POA: Insufficient documentation

## 2019-10-02 DIAGNOSIS — R519 Headache, unspecified: Secondary | ICD-10-CM | POA: Diagnosis present

## 2019-10-02 DIAGNOSIS — Z8542 Personal history of malignant neoplasm of other parts of uterus: Secondary | ICD-10-CM | POA: Diagnosis not present

## 2019-10-02 DIAGNOSIS — Z87891 Personal history of nicotine dependence: Secondary | ICD-10-CM | POA: Insufficient documentation

## 2019-10-02 DIAGNOSIS — E119 Type 2 diabetes mellitus without complications: Secondary | ICD-10-CM | POA: Diagnosis not present

## 2019-10-02 DIAGNOSIS — G44209 Tension-type headache, unspecified, not intractable: Secondary | ICD-10-CM | POA: Diagnosis not present

## 2019-10-03 LAB — CBG MONITORING, ED: Glucose-Capillary: 305 mg/dL — ABNORMAL HIGH (ref 70–99)

## 2019-10-03 MED ORDER — KETOROLAC TROMETHAMINE 60 MG/2ML IM SOLN
30.0000 mg | Freq: Once | INTRAMUSCULAR | Status: AC
  Administered 2019-10-03: 30 mg via INTRAMUSCULAR
  Filled 2019-10-03: qty 2

## 2019-10-03 NOTE — ED Triage Notes (Signed)
Patient complaining of headache that started this morning. Patient states her ear, neck, and shoulders is hurting.

## 2019-10-03 NOTE — Discharge Instructions (Signed)
You may use over-the-counter Motrin (Ibuprofen), Acetaminophen (Tylenol), topical muscle creams such as SalonPas, Icy Hot, Bengay, etc. Please stretch, apply heat, and have massage therapy for additional assistance. ° °

## 2019-10-03 NOTE — ED Provider Notes (Signed)
[facility] DEPT Provider Note  CSN: [id number] Arrival date & time: 10/02/19 2307  Chief Complaint(s) Headache  HPI Kristy [name] is a 54 y.o. female with a past medical history listed below who presents to the emergency department with gradual onset left-sided headache with neck and upper back pain.  Patient reports being at work when the pain started.  States that she works for [employer]  Pain worse with range of motion of the neck and shoulder.  Mildly alleviated by Tylenol prior to arrival.  No associated nausea or vomiting.  No fevers or chills.  No visual changes.  No focal deficits.  No chest pain or shortness of breath.  Patient has had this type headache prior, though rare.  HPI  Past Medical History Past Medical History:  Diagnosis Date  . Anemia   . Cancer ([place])   . Chronic back pain   . Diabetes mellitus type II   . Hyperlipidemia   . Kidney stones   . Uterine cancer [facility])    Patient Active Problem List   Diagnosis Date Noted  . Depressed mood 10/28/2016  . Endometrial cancer ([place]) 07/28/2016  . Abnormal uterine bleeding 12/30/2015  . Chronic left shoulder pain 02/11/2015  . Nail abnormality 09/16/2014  . Left knee pain 06/23/2014  . Microcytic anemia 07/11/2012  . Paresthesia of left leg 07/08/2012  . Low back pain 10/20/2011  . Headache(784.0) 09/24/2011  . GERD (gastroesophageal reflux disease) 09/02/2011  . Chronic back pain 12/02/2010  . Hyperlipidemia 12/01/2010  . DM (diabetes mellitus), type 2, uncontrolled ([place]) 12/01/2010  . Obesity 12/01/2010   Home Medication(s) Prior to Admission medications   Medication Sig Start Date End Date Taking? Authorizing Provider  acetaminophen (TYLENOL) 500 MG tablet Take 1,000 mg by mouth every 6 (six) hours as needed for pain.    [provider]  atorvastatin (LIPITOR) 80 MG tablet Take 1 tablet (80 mg total) by mouth daily. 05/06/18   Martyn Malay, MD  calcium  carbonate (TUMS - DOSED IN MG ELEMENTAL CALCIUM) 500 MG chewable tablet Chew 2 tablets by mouth 2 (two) times daily as needed for indigestion or heartburn.    [provider]  glucose blood (AGAMATRIX PRESTO TEST) test strip Use as instructed 05/06/18   Martyn Malay, MD  Insulin Glargine (LANTUS SOLOSTAR) 100 UNIT/ML Solostar Pen Inject 31 Units into the skin 2 (two) times daily. 03/30/19   Martyn Malay, MD  insulin lispro (HUMALOG) 100 UNIT/ML KiwkPen Inject 0.1 mLs (10 Units total) into the skin 2 (two) times daily. 05/06/18   Martyn Malay, MD  Insulin Pen Needle (BD ULTRA-FINE PEN NEEDLES) 29G X 12.7MM MISC Check blood sugar 4x daily 05/06/18   Martyn Malay, MD  Lancets MISC Check BG prior to administering insulin 05/06/18   Martyn Malay, MD  metFORMIN (GLUCOPHAGE-XR) 500 MG 24 hr tablet Take 1 tablet (500 mg total) by mouth 2 (two) times daily. 05/06/18   Martyn Malay, MD  Past Surgical History Past Surgical History:  Procedure Laterality Date  . ABDOMINAL HYSTERECTOMY    . CESAREAN SECTION    . KIDNEY STONE SURGERY    . NO PAST SURGERIES    . TUBAL LIGATION     Family History Family History  Adopted: Yes  Problem Relation Age of Onset  . Alcohol abuse Neg Hx     Social History Social History   Tobacco Use  . Smoking status: Former Smoker    Packs/day: 0.25    Types: Cigarettes    Quit date: 10/01/2016    Years since quitting: 3.0  . Smokeless tobacco: Never Used  . Tobacco comment: one cigarette daily  Substance Use Topics  . Alcohol use: Yes    Comment: Once a month  . Drug use: No   Allergies Other, Fish allergy, and Oxycodone  Review of Systems Review of Systems All other systems are reviewed and are negative for acute change except as noted in the HPI  Physical Exam Vital Signs  I have reviewed the triage  vital signs BP 125/90 (BP Location: Left Arm)   Pulse 88   Temp 98.1 F (36.7 C) (Oral)   Resp 18   Ht 4\' 11"  (1.499 m)   Wt 78.5 kg   LMP 07/14/2016 (Exact Date)   SpO2 99%   BMI 34.94 kg/m   Physical Exam Vitals reviewed.  Constitutional:      General: She is not in acute distress.    Appearance: She is well-developed. She is not diaphoretic.  HENT:     Head: Normocephalic and atraumatic.     Jaw: No tenderness.     Comments: No temporal tenderness    Right Ear: External ear normal.     Left Ear: External ear normal.     Nose: Nose normal.  Eyes:     General: No scleral icterus.    Conjunctiva/sclera: Conjunctivae normal.  Neck:     Trachea: Phonation normal.  Cardiovascular:     Rate and Rhythm: Normal rate and regular rhythm.  Pulmonary:     Effort: Pulmonary effort is normal. No respiratory distress.     Breath sounds: No stridor.  Abdominal:     General: There is no distension.  Musculoskeletal:        General: Normal range of motion.     Cervical back: Normal range of motion. Tenderness present.     Thoracic back: Spasms and tenderness present.       Back:  Neurological:     Mental Status: She is alert and oriented to person, place, and time.     Comments: Mental Status:  Alert and oriented to person, place, and time.  Attention and concentration normal.  Speech clear.  Recent memory is intact  Cranial Nerves:  II Visual Fields: Intact to confrontation. Visual fields intact. III, IV, VI: Pupils equal and reactive to light and near. Full eye movement without nystagmus  V Facial Sensation: Normal. No weakness of masticatory muscles  VII: No facial weakness or asymmetry  VIII Auditory Acuity: Grossly normal  IX/X: The uvula is midline; the palate elevates symmetrically  XI: Normal sternocleidomastoid and trapezius strength  XII: The tongue is midline. No atrophy or fasciculations.   Motor System: Muscle Strength: 5/5 and symmetric in the upper and  lower extremities. No pronation or drift.  Muscle Tone: Tone and muscle bulk are normal in the upper and lower extremities.   Reflexes: DTRs: 1+ and symmetrical in all four extremities.  No Clonus Coordination:  No tremor.  Sensation: Intact to light touch. Gait: Routine gait normal.   Psychiatric:        Behavior: Behavior normal.     ED Results and Treatments Labs (all labs ordered are listed, but only abnormal results are displayed) Labs Reviewed  CBG MONITORING, ED - Abnormal; Notable for the following components:      Result Value   Glucose-Capillary 305 (*)    All other components within normal limits                                                                                                                         EKG  EKG Interpretation  Date/Time:    Ventricular Rate:    PR Interval:    QRS Duration:   QT Interval:    QTC Calculation:   R Axis:     Text Interpretation:        Radiology No results found.  Pertinent labs & imaging results that were available during my care of the patient were reviewed by me and considered in my medical decision making (see chart for details).  Medications Ordered in ED Medications  ketorolac (TORADOL) injection 30 mg (30 mg Intramuscular Given 10/03/19 0156)                                                                                                                                    Procedures Procedures  (including critical care time)  Medical Decision Making / ED Course I have reviewed the nursing notes for this encounter and the patient's prior records (if available in EHR or on provided paperwork).   Lorijo Hannen was evaluated in Emergency Department on 10/03/2019 for the symptoms described in the history of present illness. She was evaluated in the context of the global COVID-19 pandemic, which necessitated consideration that the patient might be at risk for infection with the SARS-CoV-2 virus that causes COVID-19.  Institutional protocols and algorithms that pertain to the evaluation of patients at risk for COVID-19 are in a state of rapid change based on information released by regulatory bodies including the CDC and federal and state organizations. These policies and algorithms were followed during the patient's care in the ED.  Typical headache for the pt. Non focal neuro exam. No recent head trauma. No fever. Doubt meningitis. Doubt intracranial bleed. Doubt IIH. No indication for imaging.  Suspicious for tension headache due to muscle strain/spasm. toradol IM given.       Final Clinical Impression(s) / ED Diagnoses Final diagnoses:  Tension headache   The patient appears reasonably screened and/or stabilized for discharge and I doubt any other medical condition or other [facility] requiring further screening, evaluation, or treatment in the ED at this time prior to discharge. Safe for discharge with strict return precautions.  Disposition: Discharge  Condition: Good  I have discussed the results, Dx and Tx plan with the patient/family who expressed understanding and agree(s) with the plan. Discharge instructions discussed at length. The patient/family was given strict return precautions who verbalized understanding of the instructions. No further questions at time of discharge.    ED Discharge Orders    None        Follow Up: Salvatore Marvel, PA-C [address] [place] [address] [phone]  Schedule an appointment as soon as possible for a visit  As needed      This chart was dictated using voice recognition software.  Despite best efforts to proofread,  errors can occur which can change the documentation meaning.   Fatima Blank, MD 10/03/19 [phone]

## 2019-11-14 ENCOUNTER — Emergency Department (HOSPITAL_COMMUNITY)
Admission: EM | Admit: 2019-11-14 | Discharge: 2019-11-14 | Discharge status: Absent without leave | Payer: BLUE CROSS/BLUE SHIELD

## 2019-12-16 ENCOUNTER — Encounter (HOSPITAL_COMMUNITY): Payer: Self-pay | Admitting: Emergency Medicine

## 2019-12-16 ENCOUNTER — Other Ambulatory Visit: Payer: Self-pay

## 2019-12-16 ENCOUNTER — Emergency Department (HOSPITAL_COMMUNITY)
Admission: EM | Admit: 2019-12-16 | Discharge: 2019-12-16 | Disposition: A | Discharge status: Home / Self Care | Payer: BLUE CROSS/BLUE SHIELD | Attending: Emergency Medicine | Admitting: Emergency Medicine

## 2019-12-16 DIAGNOSIS — E119 Type 2 diabetes mellitus without complications: Secondary | ICD-10-CM | POA: Insufficient documentation

## 2019-12-16 DIAGNOSIS — Z794 Long term (current) use of insulin: Secondary | ICD-10-CM | POA: Diagnosis not present

## 2019-12-16 DIAGNOSIS — M25561 Pain in right knee: Secondary | ICD-10-CM | POA: Insufficient documentation

## 2019-12-16 MED ORDER — NAPROXEN 500 MG PO TABS: 500.0000 mg | ORAL_TABLET | Freq: Two times a day (BID) | ORAL | 0 refills | Status: AC

## 2019-12-16 NOTE — Discharge Instructions (Addendum)
We have placed your right knee on a sleep, please keep this in place for comfort.  I have provided a short prescription for anti-inflammatories, please take this medication as prescribed.  If your symptoms do not improve, you experience worsening pain or weakness to your right knee, please schedule an appointment for orthopedic follow-up.  Phone numbers attached to your chart.

## 2019-12-16 NOTE — ED Provider Notes (Signed)
[name] DEPT Provider Note   CSN: [id number] Arrival date & time: 12/16/19  2018     History Chief Complaint  Patient presents with  . Knee Pain  . Leg Pain  . Foot Pain    Kristy [name] is a 54 y.o. female.  54 y.o female with a PMH of CA, DM, presents to the ED with a chief complaint of right knee pain after twisting this this afternoon.Patient was attempted to stand up when she suddenly place her right foot on the ground, states he felt her right knee twisting, now has pain to the posterior aspect of her knee.  Reports she does have a brace which she has been using, states this has not helped with her symptoms.  She also has taken some Tylenol without improvement.  Does report sharp pain from her knee radiating down to her ankle, she is ambulatory in the ED.  No other complaints.  The history is provided by the patient and medical records.  Knee Pain Associated symptoms: no fever   Leg Pain Associated symptoms: no fever   Foot Pain       Past Medical History:  Diagnosis Date  . Anemia   . Cancer ([place])   . Chronic back pain   . Diabetes mellitus type II   . Hyperlipidemia   . Kidney stones   . Uterine cancer [facility])     Patient Active Problem List   Diagnosis Date Noted  . Depressed mood 10/28/2016  . Endometrial cancer ([place]) 07/28/2016  . Abnormal uterine bleeding 12/30/2015  . Chronic left shoulder pain 02/11/2015  . Nail abnormality 09/16/2014  . Left knee pain 06/23/2014  . Microcytic anemia 07/11/2012  . Paresthesia of left leg 07/08/2012  . Low back pain 10/20/2011  . Headache(784.0) 09/24/2011  . GERD (gastroesophageal reflux disease) 09/02/2011  . Chronic back pain 12/02/2010  . Hyperlipidemia 12/01/2010  . DM (diabetes mellitus), type 2, uncontrolled ([place]) 12/01/2010  . Obesity 12/01/2010    Past Surgical History:  Procedure Laterality Date  . ABDOMINAL HYSTERECTOMY    . CESAREAN SECTION    . KIDNEY STONE  SURGERY    . NO PAST SURGERIES    . TUBAL LIGATION       OB History    Gravida  2   Para  2   Term  2   Preterm      AB      Living  2     SAB      TAB      Ectopic      Multiple      Live Births              Family History  Adopted: Yes  Problem Relation Age of Onset  . Alcohol abuse Neg Hx     Social History   Tobacco Use  . Smoking status: Former Smoker    Packs/day: 0.25    Types: Cigarettes    Quit date: 10/01/2016    Years since quitting: 3.2  . Smokeless tobacco: Never Used  . Tobacco comment: one cigarette daily  Substance Use Topics  . Alcohol use: Yes    Comment: Once a month  . Drug use: No    Home Medications Prior to Admission medications   Medication Sig Start Date End Date Taking? Authorizing Provider  acetaminophen (TYLENOL) 500 MG tablet Take 1,000 mg by mouth every 6 (six) hours as needed for pain.    [provider]  atorvastatin (LIPITOR) 80 MG tablet Take 1 tablet (80 mg total) by mouth daily. 05/06/18   Martyn Malay, MD  calcium carbonate (TUMS - DOSED IN MG ELEMENTAL CALCIUM) 500 MG chewable tablet Chew 2 tablets by mouth 2 (two) times daily as needed for indigestion or heartburn.    [provider]  glucose blood (AGAMATRIX PRESTO TEST) test strip Use as instructed 05/06/18   Martyn Malay, MD  Insulin Glargine (LANTUS SOLOSTAR) 100 UNIT/ML Solostar Pen Inject 31 Units into the skin 2 (two) times daily. 03/30/19   Martyn Malay, MD  insulin lispro (HUMALOG) 100 UNIT/ML KiwkPen Inject 0.1 mLs (10 Units total) into the skin 2 (two) times daily. 05/06/18   Martyn Malay, MD  Insulin Pen Needle (BD ULTRA-FINE PEN NEEDLES) 29G X 12.7MM MISC Check blood sugar 4x daily 05/06/18   Martyn Malay, MD  Lancets MISC Check BG prior to administering insulin 05/06/18   Martyn Malay, MD  metFORMIN (GLUCOPHAGE-XR) 500 MG 24 hr tablet Take 1 tablet (500 mg total) by mouth 2 (two) times daily. 05/06/18   Martyn Malay, MD  naproxen (NAPROSYN) 500 MG tablet Take 1 tablet (500 mg total) by mouth 2 (two) times daily for 7 days. 12/16/19 12/23/19  Janeece Fitting, PA-C    Allergies    Other, Fish allergy, and Oxycodone  Review of Systems   Review of Systems  Constitutional: Negative for fever.  Musculoskeletal: Positive for arthralgias.    Physical Exam Updated Vital Signs BP 126/87 (BP Location: Left Arm)   Pulse 92   Temp 97.9 F (36.6 C) (Oral)   Resp 18   Ht 4\' 11"  (1.499 m)   Wt 79.4 kg   LMP 07/14/2016 (Exact Date)   SpO2 98%   BMI 35.35 kg/m   Physical Exam Vitals and nursing note reviewed.  Constitutional:      Appearance: Normal appearance.  HENT:     Head: Normocephalic and atraumatic.  Cardiovascular:     Rate and Rhythm: Normal rate.     Pulses:          Dorsalis pedis pulses are 2+ on the right side.       Posterior tibial pulses are 2+ on the right side.  Pulmonary:     Effort: Pulmonary effort is normal.     Breath sounds: No wheezing or rales.  Abdominal:     General: Abdomen is flat.     Tenderness: There is no abdominal tenderness.  Musculoskeletal:     Cervical back: Normal range of motion and neck supple.     Right knee: No bony tenderness. Normal range of motion. Tenderness present over the PCL.     Right foot: Normal range of motion.     Comments: Full ROM with pain, ttp along the posterior aspect of the knee. No effusion noted. No abrasion or swelling to the knee joint.   Feet:     Right foot:     Skin integrity: No erythema.     Comments: Pulses present, capillary refill is intact.  Full range of motion without pain to the ankle.  No swelling, tenderness to the lateral medial aspect. Skin:    General: Skin is warm and dry.  Neurological:     Mental Status: She is alert and oriented to person, place, and time.     ED Results / Procedures / Treatments   Labs (all labs ordered are listed, but only abnormal results are  displayed) Labs Reviewed - No data  to display  EKG None  Radiology No results found.  Procedures Procedures (including critical care time)  Medications Ordered in ED Medications - No data to display  ED Course  I have reviewed the triage vital signs and the nursing notes.  Pertinent labs & imaging results that were available during my care of the patient were reviewed by me and considered in my medical decision making (see chart for details).    MDM Rules/Calculators/A&P  Patient with a past medical for diabetes presents to the ED status post right knee injury.  Does report a sharp sensation from her knee radiating down to her right ankle, worse with ambulation, worse with attempted to stand up.  Denies any falls, weakness to the leg.  Does report pain with range of motion.  During evaluation right knee does not appear swollen, no effusion noted, there is tenderness to palpation on the posterior aspect.  Ankle joint is intact, pulses are present and symmetric.  We discussed imaging with x-ray, no obvious deformity on my exam.  Suspect likely strain.  We discussed treatment with a knee brace along with anti-inflammatories to help with symptoms.  She may also use rice therapy to help with symptoms.  Patient understands and agrees with management, return precautions discussed at length.  Portions of this note were generated with [employer]. Dictation errors may occur despite best attempts at proofreading.  Final Clinical Impression(s) / ED Diagnoses Final diagnoses:  Acute pain of right knee    Rx / DC Orders ED Discharge Orders         Ordered    naproxen (NAPROSYN) 500 MG tablet  2 times daily     12/16/19 2120           Janeece Fitting, PA-C 12/16/19 2139    Drenda Freeze, MD 12/17/19 [phone]

## 2019-12-16 NOTE — ED Triage Notes (Signed)
Patient present with right knee pain that radiates into the foot after "twisting" her right ankle this morning. The patient would also like to have her blood glucose checked.

## 2020-01-14 ENCOUNTER — Emergency Department (HOSPITAL_COMMUNITY): Payer: BLUE CROSS/BLUE SHIELD

## 2020-01-14 ENCOUNTER — Encounter (HOSPITAL_COMMUNITY): Payer: Self-pay

## 2020-01-14 ENCOUNTER — Other Ambulatory Visit: Payer: Self-pay

## 2020-01-14 ENCOUNTER — Emergency Department (HOSPITAL_COMMUNITY)
Admission: EM | Admit: 2020-01-14 | Discharge: 2020-01-14 | Disposition: A | Discharge status: Home / Self Care | Payer: BLUE CROSS/BLUE SHIELD | Attending: Emergency Medicine | Admitting: Emergency Medicine

## 2020-01-14 DIAGNOSIS — E1165 Type 2 diabetes mellitus with hyperglycemia: Secondary | ICD-10-CM | POA: Diagnosis not present

## 2020-01-14 DIAGNOSIS — J181 Lobar pneumonia, unspecified organism: Secondary | ICD-10-CM | POA: Diagnosis not present

## 2020-01-14 DIAGNOSIS — J189 Pneumonia, unspecified organism: Secondary | ICD-10-CM

## 2020-01-14 DIAGNOSIS — Z87891 Personal history of nicotine dependence: Secondary | ICD-10-CM | POA: Diagnosis not present

## 2020-01-14 DIAGNOSIS — Z8542 Personal history of malignant neoplasm of other parts of uterus: Secondary | ICD-10-CM | POA: Insufficient documentation

## 2020-01-14 DIAGNOSIS — R739 Hyperglycemia, unspecified: Secondary | ICD-10-CM

## 2020-01-14 DIAGNOSIS — Z7984 Long term (current) use of oral hypoglycemic drugs: Secondary | ICD-10-CM | POA: Insufficient documentation

## 2020-01-14 LAB — COMPREHENSIVE METABOLIC PANEL
ALT: 15 U/L (ref 0–44)
AST: 11 U/L — ABNORMAL LOW (ref 15–41)
Albumin: 3.9 g/dL (ref 3.5–5.0)
Alkaline Phosphatase: 138 U/L — ABNORMAL HIGH (ref 38–126)
Anion gap: 11 (ref 5–15)
BUN: 17 mg/dL (ref 6–20)
CO2: 28 mmol/L (ref 22–32)
Calcium: 9.2 mg/dL (ref 8.9–10.3)
Chloride: 88 mmol/L — ABNORMAL LOW (ref 98–111)
Creatinine, Ser: 0.72 mg/dL (ref 0.44–1.00)
GFR calc Af Amer: >60 mL/min (ref >60)
GFR calc non Af Amer: >60 mL/min (ref >60)
Glucose, Bld: 655 mg/dL (ref 70–99)
Potassium: 5.5 mmol/L — ABNORMAL HIGH (ref 3.5–5.1)
Sodium: 127 mmol/L — ABNORMAL LOW (ref 135–145)
Total Bilirubin: 1 mg/dL (ref 0.3–1.2)
Total Protein: 8.7 g/dL — ABNORMAL HIGH (ref 6.5–8.1)

## 2020-01-14 LAB — CBC
HCT: 41.5 % (ref 36.0–46.0)
Hemoglobin: 14.2 g/dL (ref 12.0–15.0)
MCH: 26.2 pg (ref 26.0–34.0)
MCHC: 34.2 g/dL (ref 30.0–36.0)
MCV: 76.7 fL — ABNORMAL LOW (ref 80.0–100.0)
Platelets: 308 10*3/uL (ref 150–400)
RBC: 5.41 MIL/uL — ABNORMAL HIGH (ref 3.87–5.11)
RDW: 13.5 % (ref 11.5–15.5)
WBC: 8.2 10*3/uL (ref 4.0–10.5)
nRBC: 0 % (ref 0.0–0.2)

## 2020-01-14 LAB — URINALYSIS, ROUTINE W REFLEX MICROSCOPIC
Bacteria, UA: NONE SEEN
Bilirubin Urine: NEGATIVE
Glucose, UA: >=500 mg/dL — AB
Hgb urine dipstick: NEGATIVE
Ketones, ur: NEGATIVE mg/dL
Leukocytes,Ua: NEGATIVE
Nitrite: NEGATIVE
Protein, ur: NEGATIVE mg/dL
Specific Gravity, Urine: 1.03 (ref 1.005–1.030)
pH: 7 (ref 5.0–8.0)

## 2020-01-14 LAB — LACTIC ACID, PLASMA: Lactic Acid, Venous: 1.3 mmol/L (ref 0.5–1.9)

## 2020-01-14 LAB — CBG MONITORING, ED
Glucose-Capillary: >600 mg/dL (ref 70–99)
Glucose-Capillary: 392 mg/dL — ABNORMAL HIGH (ref 70–99)
Glucose-Capillary: 332 mg/dL — ABNORMAL HIGH (ref 70–99)

## 2020-01-14 MED ORDER — INSULIN REGULAR(HUMAN) IN NACL 100-0.9 UT/100ML-% IV SOLN: INTRAVENOUS | Status: DC

## 2020-01-14 MED ORDER — AZITHROMYCIN 250 MG PO TABS
ORAL_TABLET | ORAL | 0 refills | Status: DC
Start: 2020-01-14 — End: 2020-03-21

## 2020-01-14 MED ORDER — SODIUM CHLORIDE 0.9 % IV BOLUS
1000.0000 mL | Freq: Once | INTRAVENOUS | Status: AC
  Administered 2020-01-14: 1000 mL via INTRAVENOUS

## 2020-01-14 MED ORDER — DEXTROSE 50 % IV SOLN: 0.0000 mL | INTRAVENOUS | Status: DC | PRN

## 2020-01-14 MED ORDER — DM-GUAIFENESIN ER 30-600 MG PO TB12
1.0000 | ORAL_TABLET | Freq: Two times a day (BID) | ORAL | Status: DC
  Administered 2020-01-14: 1 via ORAL
  Filled 2020-01-14: qty 1

## 2020-01-14 MED ORDER — AMOXICILLIN 500 MG PO CAPS
500.0000 mg | ORAL_CAPSULE | Freq: Three times a day (TID) | ORAL | 0 refills | Status: DC
Start: 2020-01-14 — End: 2020-03-21

## 2020-01-14 MED ORDER — INSULIN ASPART 100 UNIT/ML ~~LOC~~ SOLN
8.0000 [IU] | Freq: Once | SUBCUTANEOUS | Status: AC
  Administered 2020-01-14: 8 [IU] via SUBCUTANEOUS
  Filled 2020-01-14: qty 0.08

## 2020-01-14 MED ORDER — AMOXICILLIN 500 MG PO CAPS
500.0000 mg | ORAL_CAPSULE | Freq: Three times a day (TID) | ORAL | 0 refills | Status: DC
Start: 2020-01-14 — End: 2020-01-14

## 2020-01-14 MED ORDER — SODIUM CHLORIDE 0.9 % IV SOLN: INTRAVENOUS | Status: DC

## 2020-01-14 MED ORDER — DEXTROSE-NACL 5-0.45 % IV SOLN: INTRAVENOUS | Status: DC

## 2020-01-14 MED ORDER — AZITHROMYCIN 250 MG PO TABS
500.0000 mg | ORAL_TABLET | Freq: Once | ORAL | Status: AC
  Administered 2020-01-14: 500 mg via ORAL
  Filled 2020-01-14: qty 2

## 2020-01-14 MED ORDER — AZITHROMYCIN 250 MG PO TABS
ORAL_TABLET | ORAL | 0 refills | Status: DC
Start: 2020-01-14 — End: 2020-01-14

## 2020-01-14 MED ORDER — AMOXICILLIN 500 MG PO CAPS
500.0000 mg | ORAL_CAPSULE | Freq: Once | ORAL | Status: AC
  Administered 2020-01-14: 500 mg via ORAL
  Filled 2020-01-14: qty 1

## 2020-01-14 NOTE — ED Notes (Signed)
Date and time results received: 01/14/20 4:58 AM  (use smartphrase ".now" to insert current time)  Test: Glucose Critical Value: 655  Name of Provider Notified: Dr.I Tomi Bamberger  Orders Received? Or Actions Taken?:

## 2020-01-14 NOTE — ED Provider Notes (Signed)
[facility] DEPT Provider Note   CSN: [id number] Arrival date & time: 01/14/20  0226   Time seen 3:31 AM  History Chief Complaint  Patient presents with  . Hyperglycemia    Kristy [name] is a 54 y.o. female.  HPI   Patient states "my sugar is high".  She states it started since she got sick which was June 8.  She has been sneezing and has had some negative Covid test and her doctor put her on some cough pills.  She relates she still coughing, she has a headache, she feels short of breath since June 17.  She denies nausea but did have one episode of vomiting tonight.  She denies diarrhea, chest pain, abdominal pain, feeling dizzy or lightheaded, or polyuria.  She states she has been thirsty for 2 days.  She states her CBGs have been high the past 3 mornings however she states normally her blood sugars are in the 200s in the morning.  PCP . Jeanice Lim, [place]   Past Medical History:  Diagnosis Date  . Anemia   . Cancer ([place])   . Chronic back pain   . Diabetes mellitus type II   . Hyperlipidemia   . Kidney stones   . Uterine cancer [facility])     Patient Active Problem List   Diagnosis Date Noted  . Depressed mood 10/28/2016  . Endometrial cancer ([place]) 07/28/2016  . Abnormal uterine bleeding 12/30/2015  . Chronic left shoulder pain 02/11/2015  . Nail abnormality 09/16/2014  . Left knee pain 06/23/2014  . Microcytic anemia 07/11/2012  . Paresthesia of left leg 07/08/2012  . Low back pain 10/20/2011  . Headache(784.0) 09/24/2011  . GERD (gastroesophageal reflux disease) 09/02/2011  . Chronic back pain 12/02/2010  . Hyperlipidemia 12/01/2010  . DM (diabetes mellitus), type 2, uncontrolled ([place]) 12/01/2010  . Obesity 12/01/2010    Past Surgical History:  Procedure Laterality Date  . ABDOMINAL HYSTERECTOMY    . CESAREAN SECTION    . KIDNEY STONE SURGERY    . NO PAST SURGERIES    . TUBAL LIGATION       OB History    Gravida  2    Para  2   Term  2   Preterm      AB      Living  2     SAB      TAB      Ectopic      Multiple      Live Births              Family History  Adopted: Yes  Problem Relation Age of Onset  . Alcohol abuse Neg Hx     Social History   Tobacco Use  . Smoking status: Former Smoker    Packs/day: 0.25    Types: Cigarettes  . Smokeless tobacco: Never Used  . Tobacco comment: one cigarette daily  Vaping Use  . Vaping Use: Never used  Substance Use Topics  . Alcohol use: Yes    Comment: Once a month  . Drug use: No    Home Medications Prior to Admission medications   Medication Sig Start Date End Date Taking? Authorizing Provider  acetaminophen (TYLENOL) 500 MG tablet Take 1,000 mg by mouth every 6 (six) hours as needed for pain.   Yes [provider]  glucose blood (AGAMATRIX PRESTO TEST) test strip Use as instructed 05/06/18  Yes Martyn Malay, MD  Insulin Pen Needle (  BD ULTRA-FINE PEN NEEDLES) 29G X 12.7MM MISC Check blood sugar 4x daily 05/06/18  Yes Martyn Malay, MD  Lancets MISC Check BG prior to administering insulin 05/06/18  Yes Martyn Malay, MD  metFORMIN (GLUCOPHAGE) 1000 MG tablet Take 1 tablet by mouth 2 (two) times daily. 09/28/19  Yes [provider]  amoxicillin (AMOXIL) 500 MG capsule Take 1 capsule (500 mg total) by mouth 3 (three) times daily. 01/14/20   Rolland Porter, MD  azithromycin (ZITHROMAX) 250 MG tablet Take 1 po daily starting on June 21x 4d 01/14/20   Rolland Porter, MD  Insulin Glargine (LANTUS SOLOSTAR) 100 UNIT/ML Solostar Pen Inject 31 Units into the skin 2 (two) times daily. Patient taking differently: Inject 30 Units into the skin 2 (two) times daily.  03/30/19   Martyn Malay, MD  insulin lispro (HUMALOG) 100 UNIT/ML KiwkPen Inject 0.1 mLs (10 Units total) into the skin 2 (two) times daily. Patient taking differently: Inject 12 Units into the skin 2 (two) times daily.  05/06/18   Martyn Malay, MD     Allergies    Other, Fish allergy, and Oxycodone  Review of Systems   Review of Systems  All other systems reviewed and are negative.   Physical Exam Updated Vital Signs BP 121/81 (BP Location: Right Arm)   Pulse 98   Temp 98.5 F (36.9 C) (Oral)   Resp 16   Ht _0  (1.499 m)   Wt 78.5 kg   LMP 07/14/2016 (Exact Date)   SpO2 95%   BMI 34.94 kg/m   Physical Exam Vitals and nursing note reviewed.  Constitutional:      General: She is not in acute distress.    Appearance: Normal appearance. She is obese.  HENT:     Head: Normocephalic and atraumatic.     Right Ear: Tympanic membrane, ear canal and external ear normal.     Left Ear: Tympanic membrane, ear canal and external ear normal.     Nose: Nose normal.     Mouth/Throat:     Mouth: Mucous membranes are dry.     Pharynx: No oropharyngeal exudate or posterior oropharyngeal erythema.  Eyes:     Extraocular Movements: Extraocular movements intact.     Conjunctiva/sclera: Conjunctivae normal.     Pupils: Pupils are equal, round, and reactive to light.  Cardiovascular:     Rate and Rhythm: Normal rate and regular rhythm.     Pulses: Normal pulses.     Heart sounds: No murmur heard.   Pulmonary:     Effort: Pulmonary effort is normal. No respiratory distress.     Breath sounds: Normal breath sounds.  Abdominal:     General: Bowel sounds are normal.     Palpations: Abdomen is soft.     Tenderness: There is no abdominal tenderness.  Musculoskeletal:     Cervical back: Normal range of motion.  Skin:    General: Skin is warm and dry.  Neurological:     General: No focal deficit present.     Mental Status: She is alert and oriented to person, place, and time.     Cranial Nerves: No cranial nerve deficit.  Psychiatric:        Mood and Affect: Mood normal.        Behavior: Behavior normal.        Thought Content: Thought content normal.     ED Results / Procedures / Treatments   Labs (all labs ordered are  listed, but only abnormal results are displayed) Results for orders placed or performed during the hospital encounter of 01/14/20  CBC  Result Value Ref Range   WBC 8.2 4.0 - 10.5 K/uL   RBC 5.41 (H) 3.87 - 5.11 MIL/uL   Hemoglobin 14.2 12.0 - 15.0 g/dL   HCT 41.5 36 - 46 %   MCV 76.7 (L) 80.0 - 100.0 fL   MCH 26.2 26.0 - 34.0 pg   MCHC 34.2 30.0 - 36.0 g/dL   RDW 13.5 11.5 - 15.5 %   Platelets 308 150 - 400 K/uL   nRBC 0.0 0.0 - 0.2 %  Urinalysis, Routine w reflex microscopic  Result Value Ref Range   Color, Urine STRAW (A) YELLOW   APPearance CLEAR CLEAR   Specific Gravity, Urine 1.030 1.005 - 1.030   pH 7.0 5.0 - 8.0   Glucose, UA >=500 (A) NEGATIVE mg/dL   Hgb urine dipstick NEGATIVE NEGATIVE   Bilirubin Urine NEGATIVE NEGATIVE   Ketones, ur NEGATIVE NEGATIVE mg/dL   Protein, ur NEGATIVE NEGATIVE mg/dL   Nitrite NEGATIVE NEGATIVE   Leukocytes,Ua NEGATIVE NEGATIVE   RBC / HPF 0-5 0 - 5 RBC/hpf   WBC, UA 0-5 0 - 5 WBC/hpf   Bacteria, UA NONE SEEN NONE SEEN   Squamous Epithelial / LPF 0-5 0 - 5  Comprehensive metabolic panel  Result Value Ref Range   Sodium 127 (L) 135 - 145 mmol/L   Potassium 5.5 (H) 3.5 - 5.1 mmol/L   Chloride 88 (L) 98 - 111 mmol/L   CO2 28 22 - 32 mmol/L   Glucose, Bld 655 (HH) 70 - 99 mg/dL   BUN 17 6 - 20 mg/dL   Creatinine, Ser 0.72 0.44 - 1.00 mg/dL   Calcium 9.2 8.9 - 10.3 mg/dL   Total Protein 8.7 (H) 6.5 - 8.1 g/dL   Albumin 3.9 3.5 - 5.0 g/dL   AST 11 (L) 15 - 41 U/L   ALT 15 0 - 44 U/L   Alkaline Phosphatase 138 (H) 38 - 126 U/L   Total Bilirubin 1.0 0.3 - 1.2 mg/dL   GFR calc non Af Amer >60 >60 mL/min   GFR calc Af Amer >60 >60 mL/min   Anion gap 11 5 - 15  Lactic acid, plasma  Result Value Ref Range   Lactic Acid, Venous 1.3 0.5 - 1.9 mmol/L  CBG monitoring, ED  Result Value Ref Range   Glucose-Capillary >600 (HH) 70 - 99 mg/dL  CBG monitoring, ED  Result Value Ref Range   Glucose-Capillary 392 (H) 70 - 99 mg/dL     Laboratory interpretation all normal except hyperglycemia without metabolic acidosis or lactic acidosis   EKG None  Radiology DG Chest Port 1 View  Result Date: 01/14/2020 CLINICAL DATA:  Cough EXAM: PORTABLE CHEST 1 VIEW COMPARISON:  November 02, 2016 FINDINGS: The heart size and mediastinal contours are within normal limits. Streaky hazy airspace opacity seen at the left lung base. The right lung is clear. The visualized skeletal structures are unremarkable. IMPRESSION: Streaky hazy airspace opacity at the left lung base which may be due to subsegmental atelectasis and/or early infectious etiology. Electronically Signed   By: Prudencio Pair M.D.   On: 01/14/2020 03:59    Procedures Procedures (including critical care time)  Medications Ordered in ED Medications  dextromethorphan-guaiFENesin (MUCINEX DM) 30-600 MG per 12 hr tablet 1 tablet (1 tablet Oral Given 01/14/20 0456)  dextrose 5 %-0.45 % sodium chloride infusion (0 mLs Intravenous  Hold 01/14/20 0544)  dextrose 50 % solution 0-50 mL (has no administration in time range)  insulin aspart (novoLOG) injection 8 Units (has no administration in time range)  amoxicillin (AMOXIL) capsule 500 mg (has no administration in time range)  azithromycin (ZITHROMAX) tablet 500 mg (has no administration in time range)  sodium chloride 0.9 % bolus 1,000 mL (0 mLs Intravenous Stopped 01/14/20 0645)  sodium chloride 0.9 % bolus 1,000 mL (0 mLs Intravenous Stopped 01/14/20 0645)    ED Course  I have reviewed the triage vital signs and the nursing notes.  Pertinent labs & imaging results that were available during my care of the patient were reviewed by me and considered in my medical decision making (see chart for details).    MDM Rules/Calculators/A&P                         Patient was given IV fluids, after her C met resulted she was ordered to be started on a insulin drip.  6:30 AM nurse informs me she has not gotten her started on her insulin  drip yet, her last CBG at just before 6:00 was 392 just with IV fluids.  We are going to recheck her CBG and if it is close to 300 or high 200s she will be discharged home.   Patient CBG at 6:55 AM is 332.  She was given 8 units of insulin subcu and discharged home.  Patient's oxygen has been good, she will placed on antibiotics in case she does have a early community-acquired pneumonia.  Patient was informed of her test results, she states she feels ready to be discharged home.  Final Clinical Impression(s) / ED Diagnoses Final diagnoses:  Hyperglycemia  Community acquired pneumonia of left lower lobe of lung    Rx / DC Orders ED Discharge Orders         Ordered    amoxicillin (AMOXIL) 500 MG capsule  3 times daily,   Status:  Discontinued     Reprint     01/14/20 0705    azithromycin (ZITHROMAX) 250 MG tablet     Discontinue  Reprint     01/14/20 0705    amoxicillin (AMOXIL) 500 MG capsule  3 times daily     Discontinue  Reprint     01/14/20 0708          Plan discharge  Rolland Porter, MD, Barbette Or, MD 01/14/20 [phone]

## 2020-01-14 NOTE — Discharge Instructions (Addendum)
Drink plenty of fluid.  You can take acetaminophen if needed for fever.  Take Mucinex DM over-the-counter for cough.  Take the antibiotics until gone.  Please monitor your blood sugars closely while you are feeling ill.  Return to the emergency department if you get worse such as dehydrated again or you struggle to breathe.

## 2020-01-14 NOTE — ED Triage Notes (Signed)
Arrived POV coming from work. Patient reports her blood sugar has been over 400 tonight. Patient also reports headache.

## 2020-01-31 ENCOUNTER — Encounter (HOSPITAL_COMMUNITY): Payer: Self-pay | Admitting: Emergency Medicine

## 2020-01-31 ENCOUNTER — Other Ambulatory Visit: Payer: Self-pay

## 2020-01-31 ENCOUNTER — Emergency Department (HOSPITAL_COMMUNITY)
Admission: EM | Admit: 2020-01-31 | Discharge: 2020-01-31 | Discharge status: Against Medical Advice | Payer: BLUE CROSS/BLUE SHIELD

## 2020-01-31 ENCOUNTER — Ambulatory Visit (HOSPITAL_COMMUNITY)
Admission: EM | Admit: 2020-01-31 | Discharge: 2020-01-31 | Disposition: A | Discharge status: Home / Self Care | Payer: BLUE CROSS/BLUE SHIELD | Attending: Urgent Care | Admitting: Urgent Care

## 2020-01-31 DIAGNOSIS — B373 Candidiasis of vulva and vagina: Secondary | ICD-10-CM | POA: Diagnosis not present

## 2020-01-31 DIAGNOSIS — B3731 Acute candidiasis of vulva and vagina: Secondary | ICD-10-CM

## 2020-01-31 DIAGNOSIS — N898 Other specified noninflammatory disorders of vagina: Secondary | ICD-10-CM

## 2020-01-31 MED ORDER — FLUCONAZOLE 150 MG PO TABS: 150.0000 mg | ORAL_TABLET | ORAL | 0 refills | Status: DC

## 2020-01-31 NOTE — ED Triage Notes (Addendum)
Patient reports yeast infection for 3 weeks.  Patient has used medicated wipes, cream, and azo.  Patient recently treated for pneumonia, has finished antibiotics.  Patient thinks symptoms related to antibiotic Patient does not know how high blood sugar has been running

## 2020-01-31 NOTE — ED Provider Notes (Signed)
[facility]   MRN: [id number] DOB: Mar 02, 1966  Subjective:   Kristy [name] is a 54 y.o. female presenting for 3 week hx of persistent vaginal itching and burning. Recently tx for pneumonia with amoxicillin, azithromycin. Has uncontrolled diabetes. Admits hx of yeast infection from antibiotic use. Does not want any testing as she states she knows what this is.   No current facility-administered medications for this encounter.  Current Outpatient Medications:  .  Insulin Glargine (LANTUS SOLOSTAR) 100 UNIT/ML Solostar Pen, Inject 31 Units into the skin 2 (two) times daily. (Patient taking differently: Inject 30 Units into the skin 2 (two) times daily. ), Disp: 15 mL, Rfl: 11 .  insulin lispro (HUMALOG) 100 UNIT/ML KiwkPen, Inject 0.1 mLs (10 Units total) into the skin 2 (two) times daily. (Patient taking differently: Inject 12 Units into the skin 2 (two) times daily. ), Disp: 15 mL, Rfl: 3 .  metFORMIN (GLUCOPHAGE) 1000 MG tablet, Take 1 tablet by mouth 2 (two) times daily., Disp: , Rfl:  .  acetaminophen (TYLENOL) 500 MG tablet, Take 1,000 mg by mouth every 6 (six) hours as needed for pain., Disp: , Rfl:  .  amoxicillin (AMOXIL) 500 MG capsule, Take 1 capsule (500 mg total) by mouth 3 (three) times daily., Disp: 30 capsule, Rfl: 0 .  azithromycin (ZITHROMAX) 250 MG tablet, Take 1 po daily starting on June 21x 4d, Disp: 4 tablet, Rfl: 0 .  glucose blood (AGAMATRIX PRESTO TEST) test strip, Use as instructed, Disp: 700 each, Rfl: 12 .  Insulin Pen Needle (BD ULTRA-FINE PEN NEEDLES) 29G X 12.7MM MISC, Check blood sugar 4x daily, Disp: 100 each, Rfl: 11 .  Lancets MISC, Check BG prior to administering insulin, Disp: 200 each, Rfl: 2   Allergies  Allergen Reactions  . Other Anaphylaxis and Hives    hives   . Fish Allergy Hives    hives  . Oxycodone Itching and Dermatitis    Past Medical History:  Diagnosis Date  . Anemia   . Cancer ([place])   . Chronic back pain   . Diabetes  mellitus type II   . Hyperlipidemia   . Kidney stones   . Uterine cancer [facility])      Past Surgical History:  Procedure Laterality Date  . ABDOMINAL HYSTERECTOMY    . CESAREAN SECTION    . KIDNEY STONE SURGERY    . NO PAST SURGERIES    . TUBAL LIGATION      Family History  Adopted: Yes  Problem Relation Age of Onset  . Alcohol abuse Neg Hx     Social History   Tobacco Use  . Smoking status: Former Smoker    Packs/day: 0.25    Types: Cigarettes  . Smokeless tobacco: Never Used  . Tobacco comment: one cigarette daily  Vaping Use  . Vaping Use: Never used  Substance Use Topics  . Alcohol use: Yes    Comment: Once a month  . Drug use: No    ROS   Objective:   Vitals: BP (!) 130/91 (BP Location: Right Arm)   Pulse 90   Temp 98.7 F (37.1 C) (Oral)   Resp 18   LMP 07/14/2016 (Exact Date)   SpO2 99%   Physical Exam Constitutional:      General: She is not in acute distress.    Appearance: Normal appearance. She is well-developed. She is not ill-appearing.  HENT:     Head: Normocephalic and atraumatic.     Nose: Nose normal.  Mouth/Throat:     Mouth: Mucous membranes are moist.     Pharynx: Oropharynx is clear.  Eyes:     General: No scleral icterus.    Extraocular Movements: Extraocular movements intact.     Pupils: Pupils are equal, round, and reactive to light.  Cardiovascular:     Rate and Rhythm: Normal rate.  Pulmonary:     Effort: Pulmonary effort is normal.  Skin:    General: Skin is warm and dry.  Neurological:     General: No focal deficit present.     Mental Status: She is alert and oriented to person, place, and time.  Psychiatric:        Mood and Affect: Mood normal.        Behavior: Behavior normal.      Assessment and Plan :   PDMP not reviewed this encounter.  1. Yeast vaginitis   2. Vaginal itching     Patient refused testing. Counseled that we could use diflucan to address antibiotic associated yeast infection which  is a higher risk in women, uncontrolled diabetes. Recommended recheck if sx fail to improve, resolve. Counseled patient on potential for adverse effects with medications prescribed/recommended today, ER and return-to-clinic precautions discussed, patient verbalized understanding.    Jaynee Eagles, [place] 01/31/20 1920

## 2020-02-27 ENCOUNTER — Encounter (HOSPITAL_COMMUNITY): Payer: Self-pay

## 2020-02-27 ENCOUNTER — Emergency Department (HOSPITAL_COMMUNITY)
Admission: EM | Admit: 2020-02-27 | Discharge: 2020-02-27 | Disposition: A | Discharge status: Home / Self Care | Payer: BLUE CROSS/BLUE SHIELD | Attending: Emergency Medicine | Admitting: Emergency Medicine

## 2020-02-27 DIAGNOSIS — Z5321 Procedure and treatment not carried out due to patient leaving prior to being seen by health care provider: Secondary | ICD-10-CM | POA: Insufficient documentation

## 2020-02-27 DIAGNOSIS — M545 Low back pain: Secondary | ICD-10-CM | POA: Insufficient documentation

## 2020-02-27 DIAGNOSIS — R1031 Right lower quadrant pain: Secondary | ICD-10-CM | POA: Diagnosis present

## 2020-02-27 LAB — CBC
HCT: 39.9 % (ref 36.0–46.0)
Hemoglobin: 13.7 g/dL (ref 12.0–15.0)
MCH: 26.9 pg (ref 26.0–34.0)
MCHC: 34.3 g/dL (ref 30.0–36.0)
MCV: 78.4 fL — ABNORMAL LOW (ref 80.0–100.0)
Platelets: 315 10*3/uL (ref 150–400)
RBC: 5.09 MIL/uL (ref 3.87–5.11)
RDW: 14.3 % (ref 11.5–15.5)
WBC: 7.5 10*3/uL (ref 4.0–10.5)
nRBC: 0 % (ref 0.0–0.2)

## 2020-02-27 LAB — COMPREHENSIVE METABOLIC PANEL
ALT: 17 U/L (ref 0–44)
AST: 14 U/L — ABNORMAL LOW (ref 15–41)
Albumin: 3.9 g/dL (ref 3.5–5.0)
Alkaline Phosphatase: 93 U/L (ref 38–126)
Anion gap: 6 (ref 5–15)
BUN: 12 mg/dL (ref 6–20)
CO2: 29 mmol/L (ref 22–32)
Calcium: 9.5 mg/dL (ref 8.9–10.3)
Chloride: 101 mmol/L (ref 98–111)
Creatinine, Ser: 0.62 mg/dL (ref 0.44–1.00)
GFR calc Af Amer: >60 mL/min (ref >60)
GFR calc non Af Amer: >60 mL/min (ref >60)
Glucose, Bld: 322 mg/dL — ABNORMAL HIGH (ref 70–99)
Potassium: 4 mmol/L (ref 3.5–5.1)
Sodium: 136 mmol/L (ref 135–145)
Total Bilirubin: 1 mg/dL (ref 0.3–1.2)
Total Protein: 8 g/dL (ref 6.5–8.1)

## 2020-02-27 LAB — URINALYSIS, MICROSCOPIC (REFLEX): Bacteria, UA: NONE SEEN

## 2020-02-27 LAB — CBG MONITORING, ED
Glucose-Capillary: 348 mg/dL — ABNORMAL HIGH (ref 70–99)
Glucose-Capillary: 348 mg/dL — ABNORMAL HIGH (ref 70–99)

## 2020-02-27 LAB — URINALYSIS, ROUTINE W REFLEX MICROSCOPIC
Bilirubin Urine: NEGATIVE
Glucose, UA: >=500 mg/dL — AB
Hgb urine dipstick: NEGATIVE
Ketones, ur: NEGATIVE mg/dL
Leukocytes,Ua: NEGATIVE
Nitrite: NEGATIVE
Protein, ur: NEGATIVE mg/dL
Specific Gravity, Urine: 1.025 (ref 1.005–1.030)
pH: 5 (ref 5.0–8.0)

## 2020-02-27 LAB — LIPASE, BLOOD: Lipase: 28 U/L (ref 11–51)

## 2020-02-27 MED ORDER — SODIUM CHLORIDE 0.9% FLUSH: 3.0000 mL | Freq: Once | INTRAVENOUS | Status: DC

## 2020-02-27 NOTE — ED Triage Notes (Signed)
Arrived POV from home. Patient reports right lower abdominal pain and right lower back pain 10/10. Denies N,V,D

## 2020-03-20 ENCOUNTER — Emergency Department (HOSPITAL_COMMUNITY): Payer: BLUE CROSS/BLUE SHIELD

## 2020-03-20 ENCOUNTER — Emergency Department (HOSPITAL_COMMUNITY)
Admission: EM | Admit: 2020-03-20 | Discharge: 2020-03-21 | Disposition: A | Discharge status: Home / Self Care | Payer: BLUE CROSS/BLUE SHIELD | Attending: Emergency Medicine | Admitting: Emergency Medicine

## 2020-03-20 ENCOUNTER — Encounter (HOSPITAL_COMMUNITY): Payer: Self-pay | Admitting: *Deleted

## 2020-03-20 ENCOUNTER — Other Ambulatory Visit: Payer: Self-pay

## 2020-03-20 DIAGNOSIS — E119 Type 2 diabetes mellitus without complications: Secondary | ICD-10-CM | POA: Diagnosis not present

## 2020-03-20 DIAGNOSIS — M5442 Lumbago with sciatica, left side: Secondary | ICD-10-CM | POA: Diagnosis present

## 2020-03-20 DIAGNOSIS — Z794 Long term (current) use of insulin: Secondary | ICD-10-CM | POA: Insufficient documentation

## 2020-03-20 DIAGNOSIS — Z87891 Personal history of nicotine dependence: Secondary | ICD-10-CM | POA: Insufficient documentation

## 2020-03-20 DIAGNOSIS — Z8542 Personal history of malignant neoplasm of other parts of uterus: Secondary | ICD-10-CM | POA: Insufficient documentation

## 2020-03-20 NOTE — ED Triage Notes (Signed)
Pt is ambulatory to triage, states for about a week she has had pain in the left hip that radiates down to the left knee. Pt denies injury. Taking tylenol for the pain.

## 2020-03-20 NOTE — ED Provider Notes (Signed)
[place] DEPT Provider Note   CSN: [id number] Arrival date & time: 03/20/20  2034   History Chief Complaint  Patient presents with  . Hip Pain    Kristy [name] is a 54 y.o. female.  The history is provided by the patient.  Hip Pain  She has history of diabetes, hyperlipidemia, chronic back pain and comes in complaining of left lumbar pain radiating to the left hip and thigh. This has been present for about the last 2 weeks. Pain comes and goes without any particular pattern. When present, she rates it at 9/10. Nothing makes it better, nothing makes it worse. She did see her primary care provider who prescribed meloxicam which has not been helping. She has also been taking acetaminophen without relief. She denies weakness, numbness, tingling. She denies any difficulty with bowel or bladder. She has had similar pains in the past. She denies any precipitating injury or overuse.  Past Medical History:  Diagnosis Date  . Anemia   . Cancer ([place])   . Chronic back pain   . Diabetes mellitus type II   . Hyperlipidemia   . Kidney stones   . Uterine cancer [facility])     Patient Active Problem List   Diagnosis Date Noted  . Depressed mood 10/28/2016  . Endometrial cancer ([place]) 07/28/2016  . Abnormal uterine bleeding 12/30/2015  . Chronic left shoulder pain 02/11/2015  . Nail abnormality 09/16/2014  . Left knee pain 06/23/2014  . Microcytic anemia 07/11/2012  . Paresthesia of left leg 07/08/2012  . Low back pain 10/20/2011  . Headache(784.0) 09/24/2011  . GERD (gastroesophageal reflux disease) 09/02/2011  . Chronic back pain 12/02/2010  . Hyperlipidemia 12/01/2010  . DM (diabetes mellitus), type 2, uncontrolled ([place]) 12/01/2010  . Obesity 12/01/2010    Past Surgical History:  Procedure Laterality Date  . ABDOMINAL HYSTERECTOMY    . CESAREAN SECTION    . KIDNEY STONE SURGERY    . NO PAST SURGERIES    . TUBAL LIGATION       OB History    Gravida   2   Para  2   Term  2   Preterm      AB      Living  2     SAB      TAB      Ectopic      Multiple      Live Births              Family History  Adopted: Yes  Problem Relation Age of Onset  . Alcohol abuse Neg Hx     Social History   Tobacco Use  . Smoking status: Former Smoker    Packs/day: 0.25    Types: Cigarettes  . Smokeless tobacco: Never Used  . Tobacco comment: one cigarette daily  Vaping Use  . Vaping Use: Never used  Substance Use Topics  . Alcohol use: Yes    Comment: Once a month  . Drug use: No    Home Medications Prior to Admission medications   Medication Sig Start Date End Date Taking? Authorizing Provider  acetaminophen (TYLENOL) 500 MG tablet Take 1,000 mg by mouth every 6 (six) hours as needed for pain.    [provider]  amoxicillin (AMOXIL) 500 MG capsule Take 1 capsule (500 mg total) by mouth 3 (three) times daily. 01/14/20   Rolland Porter, MD  azithromycin (ZITHROMAX) 250 MG tablet Take 1 po daily starting on June 21x 4d  01/14/20   Rolland Porter, MD  fluconazole (DIFLUCAN) 150 MG tablet Take 1 tablet (150 mg total) by mouth once a week. 01/31/20   Jaynee Eagles, PA-C  glucose blood (AGAMATRIX PRESTO TEST) test strip Use as instructed 05/06/18   Martyn Malay, MD  Insulin Glargine (LANTUS SOLOSTAR) 100 UNIT/ML Solostar Pen Inject 31 Units into the skin 2 (two) times daily. Patient taking differently: Inject 30 Units into the skin 2 (two) times daily.  03/30/19   Martyn Malay, MD  insulin lispro (HUMALOG) 100 UNIT/ML KiwkPen Inject 0.1 mLs (10 Units total) into the skin 2 (two) times daily. Patient taking differently: Inject 12 Units into the skin 2 (two) times daily.  05/06/18   Martyn Malay, MD  Insulin Pen Needle (BD ULTRA-FINE PEN NEEDLES) 29G X 12.7MM MISC Check blood sugar 4x daily 05/06/18   Martyn Malay, MD  Lancets MISC Check BG prior to administering insulin 05/06/18   Martyn Malay, MD  metFORMIN (GLUCOPHAGE)  1000 MG tablet Take 1 tablet by mouth 2 (two) times daily. 09/28/19   [provider]    Allergies    Other, Fish allergy, and Oxycodone  Review of Systems   Review of Systems  All other systems reviewed and are negative.   Physical Exam Updated Vital Signs BP (!) 143/90 (BP Location: Left Arm)   Pulse 84   Temp 98.2 F (36.8 C) (Oral)   Resp 16   Ht 5' (1.524 m)   Wt 79.4 kg   LMP 07/14/2016 (Exact Date)   SpO2 97%   BMI 34.18 kg/m   Physical Exam Vitals and nursing note reviewed.   54 year old female, resting comfortably and in no acute distress. Vital signs are significant for borderline elevated blood pressure. Oxygen saturation is 97%, which is normal. Head is normocephalic and atraumatic. PERRLA, EOMI. Oropharynx is clear. Neck is nontender and supple without adenopathy or JVD. Back is mildly tender in the left paralumbar area with mild to moderate left paralumbar spasm. Straight leg raise is positive on the left at 60, negative on the right. There is no CVA tenderness. Lungs are clear without rales, wheezes, or rhonchi. Chest is nontender. Heart has regular rate and rhythm without murmur. Abdomen is soft, flat, nontender without masses or hepatosplenomegaly and peristalsis is normoactive. Extremities have no cyanosis or edema, full range of motion is present. Skin is warm and dry without rash. Neurologic: Mental status is normal, cranial nerves are intact, there are no motor or sensory deficits. Strength is 5/5 in hip flexors, knee flexors, knee extensors, dorsiflexors, plantar flexors. Sensation is symmetric to touch.  ED Results / Procedures / Treatments    Radiology DG Hip Unilat With Pelvis 2-3 Views Left  Result Date: 03/20/2020 CLINICAL DATA:  Left-sided hip pain EXAM: DG HIP (WITH OR WITHOUT PELVIS) 2-3V LEFT COMPARISON:  CT 08/27/2016 FINDINGS: Radiopaque material in the right colon. Pubic symphysis and rami are intact. No fracture or malalignment.  Soft tissues are unremarkable. IMPRESSION: No acute osseous abnormality. Electronically Signed   By: Donavan Foil M.D.   On: 03/20/2020 21:04    Procedures Procedures   Medications Ordered in ED Medications - No data to display  ED Course  I have reviewed the triage vital signs and the nursing notes.  Pertinent imaging results that were available during my care of the patient were reviewed by me and considered in my medical decision making (see chart for details).  MDM Rules/Calculators/A&P Left lumbar  pain with left-sided sciatica. Old records are reviewed confirming recent outpatient visit for left lumbar pain, prior ED visits and outpatient visits for low back pain with sciatica. No red flags today's to suggest more serious pathology. X-rays of left hip were ordered at triage and are negative for acute injury. No indication for lumbar spine imaging today. We will switch her NSAID from meloxicam to naproxen and also give a prescription for orphenadrine and a small number of tramadol tablets. She is referred back to her primary care provider for ongoing management, possible initiation of physical therapy.  Final Clinical Impression(s) / ED Diagnoses Final diagnoses:  Left-sided low back pain with left-sided sciatica, unspecified chronicity    Rx / DC Orders ED Discharge Orders         Ordered    naproxen sodium (ANAPROX) 550 MG tablet  2 times daily with meals        03/21/20 0003    orphenadrine (NORFLEX) 100 MG tablet  2 times daily        03/21/20 0003    traMADol (ULTRAM) 50 MG tablet  Every 6 hours PRN        03/21/20 3557           Delora Fuel, MD 32/20/25 [phone]

## 2020-03-21 ENCOUNTER — Telehealth (HOSPITAL_COMMUNITY): Payer: Self-pay | Admitting: Emergency Medicine

## 2020-03-21 MED ORDER — NAPROXEN SODIUM 550 MG PO TABS: 550.0000 mg | ORAL_TABLET | Freq: Two times a day (BID) | ORAL | 0 refills | Status: DC

## 2020-03-21 MED ORDER — TRAMADOL HCL 50 MG PO TABS: 50.0000 mg | ORAL_TABLET | Freq: Four times a day (QID) | ORAL | 0 refills | Status: DC | PRN

## 2020-03-21 MED ORDER — ORPHENADRINE CITRATE ER 100 MG PO TB12
100.0000 mg | ORAL_TABLET | Freq: Two times a day (BID) | ORAL | 0 refills | Status: DC
Start: 2020-03-21 — End: 2020-05-06

## 2020-03-21 NOTE — Telephone Encounter (Signed)
Pharmacy called to resend the prescription from this morning digitally. I sent the tramadol as it was written previously and pharmacy confirmed.

## 2020-03-21 NOTE — Discharge Instructions (Addendum)
Stop taking meloxicam.  Apply ice to your back for 30 minutes at a time, four times a day.  In addition to the prescribed medications, you may continue taking acetaminophen as needed for additional pain relief.  Follow-up with your primary care provider. You may benefit from physical therapy, which can be ordered by your primary care provider.  If you are not improving in spite of these treatments, your primary care provider can arrange for an MRI scan of your back, and referral to a neurosurgeon.

## 2020-04-13 ENCOUNTER — Other Ambulatory Visit: Payer: Self-pay

## 2020-04-13 ENCOUNTER — Encounter (HOSPITAL_COMMUNITY): Payer: Self-pay | Admitting: Emergency Medicine

## 2020-04-13 DIAGNOSIS — Z5321 Procedure and treatment not carried out due to patient leaving prior to being seen by health care provider: Secondary | ICD-10-CM | POA: Diagnosis not present

## 2020-04-13 DIAGNOSIS — R079 Chest pain, unspecified: Secondary | ICD-10-CM | POA: Insufficient documentation

## 2020-04-13 LAB — BASIC METABOLIC PANEL
Anion gap: 7 (ref 5–15)
BUN: 10 mg/dL (ref 6–20)
CO2: 26 mmol/L (ref 22–32)
Calcium: 9.4 mg/dL (ref 8.9–10.3)
Chloride: 100 mmol/L (ref 98–111)
Creatinine, Ser: 0.58 mg/dL (ref 0.44–1.00)
GFR calc Af Amer: >60 mL/min (ref >60)
GFR calc non Af Amer: >60 mL/min (ref >60)
Glucose, Bld: 414 mg/dL — ABNORMAL HIGH (ref 70–99)
Potassium: 4.3 mmol/L (ref 3.5–5.1)
Sodium: 133 mmol/L — ABNORMAL LOW (ref 135–145)

## 2020-04-13 LAB — TROPONIN I (HIGH SENSITIVITY): Troponin I (High Sensitivity): 2 ng/L (ref <18)

## 2020-04-13 LAB — CBC
HCT: 40.7 % (ref 36.0–46.0)
Hemoglobin: 14.2 g/dL (ref 12.0–15.0)
MCH: 26.7 pg (ref 26.0–34.0)
MCHC: 34.9 g/dL (ref 30.0–36.0)
MCV: 76.5 fL — ABNORMAL LOW (ref 80.0–100.0)
Platelets: 309 10*3/uL (ref 150–400)
RBC: 5.32 MIL/uL — ABNORMAL HIGH (ref 3.87–5.11)
RDW: 14.2 % (ref 11.5–15.5)
WBC: 6.8 10*3/uL (ref 4.0–10.5)
nRBC: 0 % (ref 0.0–0.2)

## 2020-04-13 NOTE — ED Triage Notes (Signed)
Patient arrives complaining of chest pain that began 2 weeks ago and has been intermittent. Patient states tonight it is worse than usual. Patient states increased pain on inspiration and with certain movements.

## 2020-04-14 ENCOUNTER — Emergency Department (HOSPITAL_COMMUNITY)
Admission: EM | Admit: 2020-04-14 | Discharge: 2020-04-14 | Disposition: A | Discharge status: Home / Self Care | Payer: BLUE CROSS/BLUE SHIELD | Attending: Emergency Medicine | Admitting: Emergency Medicine

## 2020-04-26 LAB — EXTERNAL GENERIC LAB PROCEDURE: COLOGUARD: NEGATIVE

## 2020-04-26 LAB — COLOGUARD: COLOGUARD: NEGATIVE

## 2020-05-06 ENCOUNTER — Encounter (HOSPITAL_COMMUNITY): Payer: Self-pay | Admitting: Emergency Medicine

## 2020-05-06 ENCOUNTER — Ambulatory Visit (HOSPITAL_COMMUNITY)
Admission: EM | Admit: 2020-05-06 | Discharge: 2020-05-06 | Disposition: A | Discharge status: Home / Self Care | Payer: BLUE CROSS/BLUE SHIELD | Attending: Internal Medicine | Admitting: Internal Medicine

## 2020-05-06 ENCOUNTER — Other Ambulatory Visit: Payer: Self-pay

## 2020-05-06 DIAGNOSIS — M79675 Pain in left toe(s): Secondary | ICD-10-CM | POA: Diagnosis not present

## 2020-05-06 MED ORDER — IBUPROFEN 600 MG PO TABS: 600.0000 mg | ORAL_TABLET | Freq: Four times a day (QID) | ORAL | 0 refills | Status: DC | PRN

## 2020-05-06 NOTE — ED Triage Notes (Signed)
Pt c/o right foot pain around her big toe x 3 days. She states the pain is worse when walking.

## 2020-05-09 NOTE — ED Provider Notes (Signed)
[place]    CSN: [id number] Arrival date & time: 05/06/20  0807      History   Chief Complaint Chief Complaint  Patient presents with  . Foot Pain    HPI Kristy [name] is a 54 y.o. female comes to urgent care with left great toe pain which started 3 days ago.  Patient says the pain is of moderate severity and worse when she is walking.  No known relieving factors.  Patient denies any redness around the great toe.  She has noticed some swelling.  No trauma to the toe.  No discharge.  No history of gout.  No fever or chills. HPI  Past Medical History:  Diagnosis Date  . Anemia   . Cancer ([place])   . Chronic back pain   . Diabetes mellitus type II   . Hyperlipidemia   . Kidney stones   . Uterine cancer [facility])     Patient Active Problem List   Diagnosis Date Noted  . Depressed mood 10/28/2016  . Endometrial cancer ([place]) 07/28/2016  . Abnormal uterine bleeding 12/30/2015  . Chronic left shoulder pain 02/11/2015  . Nail abnormality 09/16/2014  . Left knee pain 06/23/2014  . Microcytic anemia 07/11/2012  . Paresthesia of left leg 07/08/2012  . Low back pain 10/20/2011  . Headache(784.0) 09/24/2011  . GERD (gastroesophageal reflux disease) 09/02/2011  . Chronic back pain 12/02/2010  . Hyperlipidemia 12/01/2010  . DM (diabetes mellitus), type 2, uncontrolled ([place]) 12/01/2010  . Obesity 12/01/2010    Past Surgical History:  Procedure Laterality Date  . ABDOMINAL HYSTERECTOMY    . CESAREAN SECTION    . KIDNEY STONE SURGERY    . NO PAST SURGERIES    . TUBAL LIGATION      OB History    Gravida  2   Para  2   Term  2   Preterm      AB      Living  2     SAB      TAB      Ectopic      Multiple      Live Births               Home Medications    Prior to Admission medications   Medication Sig Start Date End Date Taking? Authorizing Provider  acetaminophen (TYLENOL) 500 MG tablet Take 1,000 mg by mouth every 6 (six) hours as needed  for pain.   Yes [provider]  glucose blood (AGAMATRIX PRESTO TEST) test strip Use as instructed 05/06/18  Yes Martyn Malay, MD  Insulin Glargine (LANTUS SOLOSTAR) 100 UNIT/ML Solostar Pen Inject 31 Units into the skin 2 (two) times daily. Patient taking differently: Inject 30 Units into the skin 2 (two) times daily.  03/30/19  Yes Martyn Malay, MD  insulin lispro (HUMALOG) 100 UNIT/ML KiwkPen Inject 0.1 mLs (10 Units total) into the skin 2 (two) times daily. Patient taking differently: Inject 12 Units into the skin 2 (two) times daily.  05/06/18  Yes Martyn Malay, MD  Insulin Pen Needle (BD ULTRA-FINE PEN NEEDLES) 29G X 12.7MM MISC Check blood sugar 4x daily 05/06/18  Yes Martyn Malay, MD  Lancets MISC Check BG prior to administering insulin 05/06/18  Yes Martyn Malay, MD  metFORMIN (GLUCOPHAGE) 1000 MG tablet Take 1 tablet by mouth 2 (two) times daily. 09/28/19  Yes [provider]  naproxen sodium (ANAPROX) 550 MG tablet Take 1 tablet (550  mg total) by mouth 2 (two) times daily with a meal. 0/30/09  Yes Delora Fuel, MD  ibuprofen (ADVIL) 600 MG tablet Take 1 tablet (600 mg total) by mouth every 6 (six) hours as needed. 05/06/20   Callin Ashe, Myrene Galas, MD    Family History Family History  Adopted: Yes  Problem Relation Age of Onset  . Alcohol abuse Neg Hx     Social History Social History   Tobacco Use  . Smoking status: Former Smoker    Packs/day: 0.25    Types: Cigarettes  . Smokeless tobacco: Never Used  . Tobacco comment: one cigarette daily  Vaping Use  . Vaping Use: Never used  Substance Use Topics  . Alcohol use: Yes    Comment: Once a month  . Drug use: No     Allergies   Other, Fish allergy, and Oxycodone   Review of Systems Review of Systems  Gastrointestinal: Negative.   Musculoskeletal: Positive for arthralgias and joint swelling. Negative for myalgias, neck pain and neck stiffness.  Skin: Negative.  Negative for color change  and pallor.  Neurological: Negative.      Physical Exam Triage Vital Signs ED Triage Vitals  Enc Vitals Group     BP 05/06/20 0830 119/82     Pulse Rate 05/06/20 0830 83     Resp 05/06/20 0830 16     Temp 05/06/20 0830 98.5 F (36.9 C)     Temp Source 05/06/20 0830 Oral     SpO2 05/06/20 0830 96 %     Weight --      Height --      Head Circumference --      Peak Flow --      Pain Score 05/06/20 0828 5     Pain Loc --      Pain Edu? --      Excl. in [place]? --    No data found.  Updated Vital Signs BP 119/82 (BP Location: Left Arm)   Pulse 83   Temp 98.5 F (36.9 C) (Oral)   Resp 16   LMP 07/14/2016 (Exact Date)   SpO2 96%   Visual Acuity Right Eye Distance:   Left Eye Distance:   Bilateral Distance:    Right Eye Near:   Left Eye Near:    Bilateral Near:     Physical Exam Vitals and nursing note reviewed.  Constitutional:      General: She is not in acute distress.    Appearance: She is not ill-appearing.  Musculoskeletal:        General: Swelling and tenderness present. Normal range of motion.     Comments: Painful range of motion  Skin:    General: Skin is warm.     Coloration: Skin is not pale.     Findings: No erythema.  Neurological:     Mental Status: She is alert.      UC Treatments / Results  Labs (all labs ordered are listed, but only abnormal results are displayed) Labs Reviewed - No data to display  EKG   Radiology No results found.  Procedures Procedures (including critical care time)  Medications Ordered in UC Medications - No data to display  Initial Impression / Assessment and Plan / UC Course  I have reviewed the triage vital signs and the nursing notes.  Pertinent labs & imaging results that were available during my care of the patient were reviewed by me and considered in my medical decision making (see chart  for details).     1.  Left great toe pain. This is likely toe sprain Ibuprofen 600 mg every 6 hours as  needed for pain No medication for x-rays at this time Return precautions given Final Clinical Impressions(s) / UC Diagnoses   Final diagnoses:  Great toe pain, left   Discharge Instructions   None    ED Prescriptions    Medication Sig Dispense Auth. Provider   ibuprofen (ADVIL) 600 MG tablet Take 1 tablet (600 mg total) by mouth every 6 (six) hours as needed. 30 tablet Jayla Mackie, Myrene Galas, MD     PDMP not reviewed this encounter.   Chase Picket, MD 05/09/20 1334

## 2020-07-16 ENCOUNTER — Encounter (HOSPITAL_COMMUNITY): Payer: Self-pay | Admitting: Emergency Medicine

## 2020-07-16 ENCOUNTER — Other Ambulatory Visit: Payer: Self-pay

## 2020-07-16 ENCOUNTER — Emergency Department (HOSPITAL_COMMUNITY)
Admission: EM | Admit: 2020-07-16 | Discharge: 2020-07-16 | Disposition: A | Discharge status: Home / Self Care | Payer: BLUE CROSS/BLUE SHIELD | Attending: Emergency Medicine | Admitting: Emergency Medicine

## 2020-07-16 DIAGNOSIS — R2991 Unspecified symptoms and signs involving the musculoskeletal system: Secondary | ICD-10-CM | POA: Insufficient documentation

## 2020-07-16 DIAGNOSIS — Z5321 Procedure and treatment not carried out due to patient leaving prior to being seen by health care provider: Secondary | ICD-10-CM | POA: Insufficient documentation

## 2020-07-16 DIAGNOSIS — W182XXA Fall in (into) shower or empty bathtub, initial encounter: Secondary | ICD-10-CM | POA: Insufficient documentation

## 2020-07-16 DIAGNOSIS — Y92002 Bathroom of unspecified non-institutional (private) residence single-family (private) house as the place of occurrence of the external cause: Secondary | ICD-10-CM | POA: Diagnosis not present

## 2020-07-16 NOTE — ED Triage Notes (Signed)
Pt reports that she fell this morning in the bathroom. She states that she caught herself with her L arm and heard a pop under her L breast. No LOC or head injury,

## 2020-07-24 ENCOUNTER — Emergency Department (HOSPITAL_BASED_OUTPATIENT_CLINIC_OR_DEPARTMENT_OTHER): Payer: BLUE CROSS/BLUE SHIELD

## 2020-07-24 ENCOUNTER — Other Ambulatory Visit: Payer: Self-pay

## 2020-07-24 ENCOUNTER — Encounter (HOSPITAL_BASED_OUTPATIENT_CLINIC_OR_DEPARTMENT_OTHER): Payer: Self-pay | Admitting: *Deleted

## 2020-07-24 DIAGNOSIS — Z5321 Procedure and treatment not carried out due to patient leaving prior to being seen by health care provider: Secondary | ICD-10-CM | POA: Diagnosis not present

## 2020-07-24 DIAGNOSIS — R0789 Other chest pain: Secondary | ICD-10-CM | POA: Insufficient documentation

## 2020-07-24 LAB — CBC WITH DIFFERENTIAL/PLATELET
Abs Immature Granulocytes: 0.01 10*3/uL (ref 0.00–0.07)
Basophils Absolute: 0 10*3/uL (ref 0.0–0.1)
Basophils Relative: 1 %
Eosinophils Absolute: 0.3 10*3/uL (ref 0.0–0.5)
Eosinophils Relative: 6 %
HCT: 37.2 % (ref 36.0–46.0)
Hemoglobin: 13 g/dL (ref 12.0–15.0)
Immature Granulocytes: 0 %
Lymphocytes Relative: 26 %
Lymphs Abs: 1 10*3/uL (ref 0.7–4.0)
MCH: 26.6 pg (ref 26.0–34.0)
MCHC: 34.9 g/dL (ref 30.0–36.0)
MCV: 76.1 fL — ABNORMAL LOW (ref 80.0–100.0)
Monocytes Absolute: 0.5 10*3/uL (ref 0.1–1.0)
Monocytes Relative: 11 %
Neutro Abs: 2.2 10*3/uL (ref 1.7–7.7)
Neutrophils Relative %: 56 %
Platelets: 256 10*3/uL (ref 150–400)
RBC: 4.89 MIL/uL (ref 3.87–5.11)
RDW: 14.1 % (ref 11.5–15.5)
WBC: 4 10*3/uL (ref 4.0–10.5)
nRBC: 0 % (ref 0.0–0.2)

## 2020-07-24 LAB — COMPREHENSIVE METABOLIC PANEL
ALT: 15 U/L (ref 0–44)
AST: 15 U/L (ref 15–41)
Albumin: 3.7 g/dL (ref 3.5–5.0)
Alkaline Phosphatase: 82 U/L (ref 38–126)
Anion gap: 8 (ref 5–15)
BUN: 13 mg/dL (ref 6–20)
CO2: 24 mmol/L (ref 22–32)
Calcium: 8.9 mg/dL (ref 8.9–10.3)
Chloride: 103 mmol/L (ref 98–111)
Creatinine, Ser: 0.81 mg/dL (ref 0.44–1.00)
GFR, Estimated: >60 mL/min (ref >60)
Glucose, Bld: 274 mg/dL — ABNORMAL HIGH (ref 70–99)
Potassium: 3.7 mmol/L (ref 3.5–5.1)
Sodium: 135 mmol/L (ref 135–145)
Total Bilirubin: 0.3 mg/dL (ref 0.3–1.2)
Total Protein: 7.4 g/dL (ref 6.5–8.1)

## 2020-07-24 LAB — TROPONIN I (HIGH SENSITIVITY): Troponin I (High Sensitivity): <2 ng/L (ref <18)

## 2020-07-24 NOTE — ED Triage Notes (Signed)
C/o left sided chest and rib pain x 1 week

## 2020-07-25 ENCOUNTER — Emergency Department (HOSPITAL_BASED_OUTPATIENT_CLINIC_OR_DEPARTMENT_OTHER)
Admission: EM | Admit: 2020-07-25 | Discharge: 2020-07-25 | Disposition: A | Discharge status: Home / Self Care | Payer: BLUE CROSS/BLUE SHIELD | Attending: Emergency Medicine | Admitting: Emergency Medicine

## 2020-07-25 NOTE — ED Notes (Signed)
Explained wait to patient.

## 2020-07-25 NOTE — ED Notes (Signed)
Called pt to take to treatment room  No response from lobby  Pt greeter at door states pt left

## 2021-02-04 DIAGNOSIS — R7309 Other abnormal glucose: Secondary | ICD-10-CM | POA: Insufficient documentation

## 2021-02-04 DIAGNOSIS — Z5321 Procedure and treatment not carried out due to patient leaving prior to being seen by health care provider: Secondary | ICD-10-CM | POA: Insufficient documentation

## 2021-02-04 DIAGNOSIS — R519 Headache, unspecified: Secondary | ICD-10-CM | POA: Insufficient documentation

## 2021-02-04 DIAGNOSIS — R110 Nausea: Secondary | ICD-10-CM | POA: Insufficient documentation

## 2021-02-05 ENCOUNTER — Emergency Department (HOSPITAL_COMMUNITY)
Admission: EM | Admit: 2021-02-05 | Discharge: 2021-02-05 | Disposition: A | Discharge status: Home / Self Care | Payer: BLUE CROSS/BLUE SHIELD | Attending: Emergency Medicine | Admitting: Emergency Medicine

## 2021-02-05 ENCOUNTER — Other Ambulatory Visit: Payer: Self-pay

## 2021-02-05 ENCOUNTER — Encounter (HOSPITAL_COMMUNITY): Payer: Self-pay

## 2021-02-05 LAB — CBG MONITORING, ED: Glucose-Capillary: 272 mg/dL — ABNORMAL HIGH (ref 70–99)

## 2021-02-05 NOTE — ED Triage Notes (Signed)
Pt complains of a headache for the past few days. The nausea came yesterday and comes and goes. Pt states that she lost her needle to check her blood sugar at home.

## 2021-02-05 NOTE — ED Notes (Signed)
Patient, after moving her back to the lobby, gave her stickers to registration and stated to them that she was leaving. Registration handed patient's stickers to this RN.

## 2021-02-11 ENCOUNTER — Emergency Department (HOSPITAL_COMMUNITY)
Admission: EM | Admit: 2021-02-11 | Discharge: 2021-02-12 | Disposition: A | Discharge status: Home / Self Care | Payer: BLUE CROSS/BLUE SHIELD | Attending: Emergency Medicine | Admitting: Emergency Medicine

## 2021-02-11 ENCOUNTER — Other Ambulatory Visit: Payer: Self-pay

## 2021-02-11 ENCOUNTER — Encounter (HOSPITAL_COMMUNITY): Payer: Self-pay

## 2021-02-11 DIAGNOSIS — M79604 Pain in right leg: Secondary | ICD-10-CM | POA: Diagnosis present

## 2021-02-11 DIAGNOSIS — Z794 Long term (current) use of insulin: Secondary | ICD-10-CM | POA: Insufficient documentation

## 2021-02-11 DIAGNOSIS — M79661 Pain in right lower leg: Secondary | ICD-10-CM

## 2021-02-11 DIAGNOSIS — E119 Type 2 diabetes mellitus without complications: Secondary | ICD-10-CM | POA: Insufficient documentation

## 2021-02-11 DIAGNOSIS — Z8542 Personal history of malignant neoplasm of other parts of uterus: Secondary | ICD-10-CM | POA: Insufficient documentation

## 2021-02-11 DIAGNOSIS — Z7984 Long term (current) use of oral hypoglycemic drugs: Secondary | ICD-10-CM | POA: Insufficient documentation

## 2021-02-11 DIAGNOSIS — Z87891 Personal history of nicotine dependence: Secondary | ICD-10-CM | POA: Insufficient documentation

## 2021-02-11 NOTE — ED Triage Notes (Signed)
Pt reports right leg swelling and pain. Pt reports the pain starts behind her knee and goes down to her foot. This started a few days ago.

## 2021-02-12 ENCOUNTER — Ambulatory Visit (HOSPITAL_COMMUNITY): Admission: RE | Admit: 2021-02-12 | Payer: BLUE CROSS/BLUE SHIELD | Source: Ambulatory Visit

## 2021-02-12 MED ORDER — RIVAROXABAN 15 MG PO TABS
15.0000 mg | ORAL_TABLET | Freq: Once | ORAL | Status: AC
  Administered 2021-02-12: 15 mg via ORAL
  Filled 2021-02-12: qty 1

## 2021-02-12 NOTE — Discharge Instructions (Addendum)
Apply ice for 30 minutes at a time, 4 times a day.  Return in the morning to [facility] for vascular ultrasound to make sure you do not have a blood clot in your leg.  If the ultrasound does not show a blood clot, continue with conservative treatments of ice and over-the-counter analgesics like ibuprofen or naproxen or acetaminophen.  If the ultrasound does show a blood clot, you will need to be on a course of blood thinners for 6-12 weeks.

## 2021-02-12 NOTE — ED Provider Notes (Signed)
[place] DEPT Provider Note   CSN: [id number] Arrival date & time: 02/11/21  2228     History Chief Complaint  Patient presents with   Leg Pain   Leg Swelling    Kristy [name] is a 55 y.o. female.  The history is provided by the patient.  Leg Pain She has history of diabetes, hyperlipidemia, chronic back pain and comes in with pain in her right calf for the last 3 days.  Pain is worse with walking.  She rates pain at 5/10.  She did take a dose of acetaminophen with temporary relief of pain.  She denies any recent trauma or unusual activity.  She denies any weakness, numbness, tingling.  She thinks that her leg is swollen.   Past Medical History:  Diagnosis Date   Anemia    Cancer ([place])    Chronic back pain    Diabetes mellitus type II    Hyperlipidemia    Kidney stones    Uterine cancer [facility])     Patient Active Problem List   Diagnosis Date Noted   Depressed mood 10/28/2016   Endometrial cancer ([place]) 07/28/2016   Abnormal uterine bleeding 12/30/2015   Chronic left shoulder pain 02/11/2015   Nail abnormality 09/16/2014   Left knee pain 06/23/2014   Microcytic anemia 07/11/2012   Paresthesia of left leg 07/08/2012   Low back pain 10/20/2011   Headache(784.0) 09/24/2011   GERD (gastroesophageal reflux disease) 09/02/2011   Chronic back pain 12/02/2010   Hyperlipidemia 12/01/2010   DM (diabetes mellitus), type 2, uncontrolled ([place]) 12/01/2010   Obesity 12/01/2010    Past Surgical History:  Procedure Laterality Date   ABDOMINAL HYSTERECTOMY     CESAREAN SECTION     KIDNEY STONE SURGERY     NO PAST SURGERIES     TUBAL LIGATION       OB History     Gravida  2   Para  2   Term  2   Preterm      AB      Living  2      SAB      IAB      Ectopic      Multiple      Live Births              Family History  Adopted: Yes  Problem Relation Age of Onset   Alcohol abuse Neg Hx     Social History   Tobacco  Use   Smoking status: Former    Packs/day: 0.25    Types: Cigarettes   Smokeless tobacco: Never   Tobacco comments:    one cigarette daily  Vaping Use   Vaping Use: Never used  Substance Use Topics   Alcohol use: Yes    Comment: Once a month   Drug use: No    Home Medications Prior to Admission medications   Medication Sig Start Date End Date Taking? Authorizing Provider  acetaminophen (TYLENOL) 500 MG tablet Take 1,000 mg by mouth every 6 (six) hours as needed for pain.    [provider]  glucose blood (AGAMATRIX PRESTO TEST) test strip Use as instructed 05/06/18   Martyn Malay, MD  ibuprofen (ADVIL) 600 MG tablet Take 1 tablet (600 mg total) by mouth every 6 (six) hours as needed. 05/06/20   Lamptey, Myrene Galas, MD  Insulin Glargine (LANTUS SOLOSTAR) 100 UNIT/ML Solostar Pen Inject 31 Units into the skin 2 (two) times daily. Patient taking  differently: Inject 30 Units into the skin 2 (two) times daily.  03/30/19   Martyn Malay, MD  insulin lispro (HUMALOG) 100 UNIT/ML KiwkPen Inject 0.1 mLs (10 Units total) into the skin 2 (two) times daily. Patient taking differently: Inject 12 Units into the skin 2 (two) times daily.  05/06/18   Martyn Malay, MD  Insulin Pen Needle (BD ULTRA-FINE PEN NEEDLES) 29G X 12.7MM MISC Check blood sugar 4x daily 05/06/18   Martyn Malay, MD  Lancets MISC Check BG prior to administering insulin 05/06/18   Martyn Malay, MD  metFORMIN (GLUCOPHAGE) 1000 MG tablet Take 1 tablet by mouth 2 (two) times daily. 09/28/19   [provider]  naproxen sodium (ANAPROX) 550 MG tablet Take 1 tablet (550 mg total) by mouth 2 (two) times daily with a meal. 12/12/82   Delora Fuel, MD    Allergies    Other, Fish allergy, and Oxycodone  Review of Systems   Review of Systems  All other systems reviewed and are negative.  Physical Exam Updated Vital Signs BP 131/77 (BP Location: Left Arm)   Pulse 94   Temp 98 F (36.7 C) (Oral)   Resp 16    Ht 4\' 11"  (1.499 m)   Wt 81.2 kg   LMP 07/14/2016 (Exact Date)   SpO2 97%   BMI 36.15 kg/m   Physical Exam Vitals and nursing note reviewed.  55 year old female, resting comfortably and in no acute distress. Vital signs are normal. Oxygen saturation is 97%, which is normal. Head is normocephalic and atraumatic. PERRLA, EOMI. Oropharynx is clear. Neck is nontender and supple without adenopathy or JVD. Back is nontender and there is no CVA tenderness. Lungs are clear without rales, wheezes, or rhonchi. Chest is nontender. Heart has regular rate and rhythm without murmur. Abdomen is soft, flat, nontender without masses or hepatosplenomegaly and peristalsis is normoactive. Extremities have no cyanosis or edema, full range of motion is present.  Calf circumference is symmetric.  There is tenderness palpation over the right calf with a mildly positive Homans' sign on the right.  No cords are palpable and there is no erythema or warmth. Skin is warm and dry without rash. Neurologic: Mental status is normal, cranial nerves are intact, there are no motor or sensory deficits.  ED Results / Procedures / Treatments    Procedures Procedures   Medications Ordered in ED Medications  Rivaroxaban (XARELTO) tablet 15 mg (has no administration in time range)    ED Course  I have reviewed the triage vital signs and the nursing notes.  MDM Rules/Calculators/A&P                         Right calf pain, probable muscular strain.  Will send for vascular ultrasound to rule out DVT.  She is given a dose of rivaroxaban and outpatient DVT study is ordered.  Patient advised on ice and elevation, acetaminophen for pain.  If DVT study is negative, advised she may use ibuprofen or naproxen at that point.  Old records are reviewed, and it is noted that she has prior ED visits for back pain.  Final Clinical Impression(s) / ED Diagnoses Final diagnoses:  Pain of right calf    Rx / DC Orders ED Discharge  Orders          Ordered    [facility]        02/12/21 0008  Delora Fuel, MD 71/29/29 [phone]

## 2021-05-16 ENCOUNTER — Emergency Department (HOSPITAL_COMMUNITY)
Admission: EM | Admit: 2021-05-16 | Discharge: 2021-05-16 | Disposition: A | Discharge status: Home / Self Care | Payer: BLUE CROSS/BLUE SHIELD | Attending: Emergency Medicine | Admitting: Emergency Medicine

## 2021-05-16 ENCOUNTER — Encounter (HOSPITAL_COMMUNITY): Payer: Self-pay | Admitting: Emergency Medicine

## 2021-05-16 ENCOUNTER — Emergency Department (HOSPITAL_COMMUNITY): Payer: BLUE CROSS/BLUE SHIELD

## 2021-05-16 DIAGNOSIS — Z8542 Personal history of malignant neoplasm of other parts of uterus: Secondary | ICD-10-CM | POA: Diagnosis not present

## 2021-05-16 DIAGNOSIS — Z794 Long term (current) use of insulin: Secondary | ICD-10-CM | POA: Insufficient documentation

## 2021-05-16 DIAGNOSIS — Z7984 Long term (current) use of oral hypoglycemic drugs: Secondary | ICD-10-CM | POA: Diagnosis not present

## 2021-05-16 DIAGNOSIS — M25562 Pain in left knee: Secondary | ICD-10-CM | POA: Diagnosis present

## 2021-05-16 DIAGNOSIS — Z87891 Personal history of nicotine dependence: Secondary | ICD-10-CM | POA: Diagnosis not present

## 2021-05-16 DIAGNOSIS — E119 Type 2 diabetes mellitus without complications: Secondary | ICD-10-CM | POA: Insufficient documentation

## 2021-05-16 MED ORDER — KETOROLAC TROMETHAMINE 15 MG/ML IJ SOLN
15.0000 mg | Freq: Once | INTRAMUSCULAR | Status: AC
  Administered 2021-05-16: 15 mg via INTRAMUSCULAR
  Filled 2021-05-16: qty 1

## 2021-05-16 NOTE — Discharge Instructions (Addendum)
You came to the emergency department today to be evaluated for your knee pain.  Your physical exam was reassuring.  Your x-ray showed no broken bones, dislocations, or arthritis.  Please follow-up with orthopedic provider if your knee pain does not improve.  Please take Ibuprofen (Advil, motrin) and Tylenol (acetaminophen) to relieve your pain.    You may take up to 600 MG (3 pills) of normal strength ibuprofen every 8 hours as needed.   You make take tylenol, up to 1,000 mg (two extra strength pills) every 8 hours as needed.   It is safe to take ibuprofen and tylenol at the same time as they work differently.   Do not take more than 3,000 mg tylenol in a 24 hour period (not more than one dose every 8 hours.  Please check all medication labels as many medications such as pain and cold medications may contain tylenol.  Do not drink alcohol while taking these medications.  Do not take other NSAID'S while taking ibuprofen (such as aleve or naproxen).  Please take ibuprofen with food to decrease stomach upset.  Get help right away if: Your knee swells, and the swelling becomes worse. You cannot move your knee. You have severe pain in your knee that cannot be managed with pain medicine.

## 2021-05-16 NOTE — ED Triage Notes (Signed)
Pt reports an intermittent "squeezing, sharpe" pain in her left knee that began while she was at work today. Denies trauma to area.

## 2021-05-16 NOTE — ED Provider Notes (Signed)
[facility] DEPT Provider Note   CSN: [id number] Arrival date & time: 05/16/21  1924     History Chief Complaint  Patient presents with   Knee Pain    Kristy [name] is a 55 y.o. female with history of diabetes mellitus, chronic back pain, hyperlipidemia, uterine cancer, left knee pain.  Patient presents to the emergency department with a chief plaint of left knee pain.  Patient reports that pain started today at 1430 while she was at work.  Patient reports a constant sore feeling to her knee with intermittent "shooting pain."  At present patient rates pain 7/10 on the pain scale.  Pain is worse with movement, touch, and ambulation.  Patient denies any modalities to alleviate her symptoms.  Patient denies any recent falls or injuries.  Patient reports that she works at [employer] on his on her feet standing for prolonged periods of time.  Patient denies any numbness, weakness, rash, wound, color change.   Knee Pain Associated symptoms: no fever       Past Medical History:  Diagnosis Date   Anemia    Cancer ([place])    Chronic back pain    Diabetes mellitus type II    Hyperlipidemia    Kidney stones    Uterine cancer [facility])     Patient Active Problem List   Diagnosis Date Noted   Depressed mood 10/28/2016   Endometrial cancer ([place]) 07/28/2016   Abnormal uterine bleeding 12/30/2015   Chronic left shoulder pain 02/11/2015   Nail abnormality 09/16/2014   Left knee pain 06/23/2014   Microcytic anemia 07/11/2012   Paresthesia of left leg 07/08/2012   Low back pain 10/20/2011   Headache(784.0) 09/24/2011   GERD (gastroesophageal reflux disease) 09/02/2011   Chronic back pain 12/02/2010   Hyperlipidemia 12/01/2010   DM (diabetes mellitus), type 2, uncontrolled 12/01/2010   Obesity 12/01/2010    Past Surgical History:  Procedure Laterality Date   ABDOMINAL HYSTERECTOMY     CESAREAN SECTION     KIDNEY STONE SURGERY     NO PAST SURGERIES      TUBAL LIGATION       OB History     Gravida  2   Para  2   Term  2   Preterm      AB      Living  2      SAB      IAB      Ectopic      Multiple      Live Births              Family History  Adopted: Yes  Problem Relation Age of Onset   Alcohol abuse Neg Hx     Social History   Tobacco Use   Smoking status: Former    Packs/day: 0.25    Types: Cigarettes   Smokeless tobacco: Never   Tobacco comments:    one cigarette daily  Vaping Use   Vaping Use: Never used  Substance Use Topics   Alcohol use: Yes    Comment: Once a month   Drug use: No    Home Medications Prior to Admission medications   Medication Sig Start Date End Date Taking? Authorizing Provider  acetaminophen (TYLENOL) 500 MG tablet Take 1,000 mg by mouth every 6 (six) hours as needed for pain.    [provider]  glucose blood (AGAMATRIX PRESTO TEST) test strip Use as instructed 05/06/18   Martyn Malay,  MD  ibuprofen (ADVIL) 600 MG tablet Take 1 tablet (600 mg total) by mouth every 6 (six) hours as needed. 05/06/20   Lamptey, Myrene Galas, MD  Insulin Glargine (LANTUS SOLOSTAR) 100 UNIT/ML Solostar Pen Inject 31 Units into the skin 2 (two) times daily. Patient taking differently: Inject 30 Units into the skin 2 (two) times daily.  03/30/19   Martyn Malay, MD  insulin lispro (HUMALOG) 100 UNIT/ML KiwkPen Inject 0.1 mLs (10 Units total) into the skin 2 (two) times daily. Patient taking differently: Inject 12 Units into the skin 2 (two) times daily.  05/06/18   Martyn Malay, MD  Insulin Pen Needle (BD ULTRA-FINE PEN NEEDLES) 29G X 12.7MM MISC Check blood sugar 4x daily 05/06/18   Martyn Malay, MD  Lancets MISC Check BG prior to administering insulin 05/06/18   Martyn Malay, MD  metFORMIN (GLUCOPHAGE) 1000 MG tablet Take 1 tablet by mouth 2 (two) times daily. 09/28/19   [provider]  naproxen sodium (ANAPROX) 550 MG tablet Take 1 tablet (550 mg total) by mouth  2 (two) times daily with a meal. 3/78/58   Delora Fuel, MD    Allergies    Other, Fish allergy, and Oxycodone  Review of Systems   Review of Systems  Constitutional:  Negative for chills and fever.  Musculoskeletal:  Positive for arthralgias.  Skin:  Negative for color change, pallor, rash and wound.  Neurological:  Negative for weakness and numbness.   Physical Exam Updated Vital Signs BP 134/87 (BP Location: Right Arm)   Pulse 97   Temp 98.6 F (37 C) (Oral)   Resp 15   Ht 4\' 11"  (1.499 m)   Wt 74.8 kg   LMP 07/14/2016 (Exact Date)   SpO2 98%   BMI 33.33 kg/m   Physical Exam Vitals and nursing note reviewed.  Constitutional:      General: She is not in acute distress.    Appearance: She is not ill-appearing, toxic-appearing or diaphoretic.  HENT:     Head: Normocephalic.  Eyes:     General: No scleral icterus.       Right eye: No discharge.        Left eye: No discharge.  Cardiovascular:     Rate and Rhythm: Normal rate.     Pulses:          Dorsalis pedis pulses are 2+ on the right side and 2+ on the left side.  Pulmonary:     Effort: Pulmonary effort is normal.  Musculoskeletal:     Right knee: No swelling, deformity, effusion, erythema, ecchymosis, lacerations, bony tenderness or crepitus. Normal range of motion. No tenderness. Normal alignment.     Left knee: Bony tenderness present. No swelling, deformity, effusion, erythema, ecchymosis, lacerations or crepitus. Decreased range of motion. Tenderness present over the medial joint line. Normal alignment.     Instability Tests: Anterior drawer test negative. Posterior drawer test negative.     Right lower leg: Normal.     Left lower leg: Normal.     Right ankle: No swelling, deformity, ecchymosis or lacerations. No tenderness. Normal range of motion.     Left ankle: No swelling, deformity, ecchymosis or lacerations. No tenderness. Normal range of motion.     Right foot: Normal range of motion and normal  capillary refill. No swelling, deformity, laceration, tenderness, bony tenderness or crepitus. Normal pulse.     Left foot: Normal range of motion and normal capillary refill. No swelling, deformity,  laceration, tenderness, bony tenderness or crepitus. Normal pulse.  Skin:    General: Skin is warm and dry.  Neurological:     General: No focal deficit present.     Mental Status: She is alert.     GCS: GCS eye subscore is 4. GCS verbal subscore is 5. GCS motor subscore is 6.  Psychiatric:        Behavior: Behavior is cooperative.    ED Results / Procedures / Treatments   Labs (all labs ordered are listed, but only abnormal results are displayed) Labs Reviewed - No data to display  EKG None  Radiology DG Knee Complete 4 Views Left  Result Date: 05/16/2021 CLINICAL DATA:  Left knee pain. EXAM: LEFT KNEE - COMPLETE 4+ VIEW COMPARISON:  None. FINDINGS: There is no acute fracture or dislocation. The bones are well mineralized. No arthritic changes. No joint effusion. The soft tissues are unremarkable. IMPRESSION: Negative. Electronically Signed   By: Anner Crete M.D.   On: 05/16/2021 21:14    Procedures Procedures   Medications Ordered in ED Medications  ketorolac (TORADOL) 15 MG/ML injection 15 mg (has no administration in time range)    ED Course  I have reviewed the triage vital signs and the nursing notes.  Pertinent labs & imaging results that were available during my care of the patient were reviewed by me and considered in my medical decision making (see chart for details).    MDM Rules/Calculators/A&P                           Alert 55 year old female no acute stress, nontoxic-appearing.  Presents to the emergency department with chief complaint of left knee pain.  Patient denies any falls or traumatic injuries.  Low suspicion for septic arthritis patient has no swelling, erythema, or warmth to affected knee.  Patient has decreased range of motion secondary to  complaints of pain.  Tenderness to medial joint line as well as patella.  Will obtain x-ray imaging to evaluate for acute osseous abnormality.  Patient denies any history of kidney dysfunction, reports he is not on blood thinners.  We will give patient Toradol IM injection for pain management.  X-ray imaging shows no acute osseous abnormality.  We will give patient information to follow-up with orthopedic provider.  Patient to rest, ice, and elevate knee to help with symptoms.  Patient use over-the-counter pain medication as needed.  Discussed results, findings, treatment and follow up. Patient advised of return precautions. Patient verbalized understanding and agreed with plan.   Final Clinical Impression(s) / ED Diagnoses Final diagnoses:  None    Rx / DC Orders ED Discharge Orders     None        Loni Beckwith, PA-C 05/16/21 2210    Fredia Sorrow, MD 05/21/21 [phone]

## 2021-07-11 IMAGING — DX DG CHEST 1V PORT
1 series · 1 of 1 positions shown · non-contrast
Comparison: November 02, 2016

CLINICAL DATA: Cough

EXAM:
PORTABLE CHEST 1 VIEW

[chest ap]
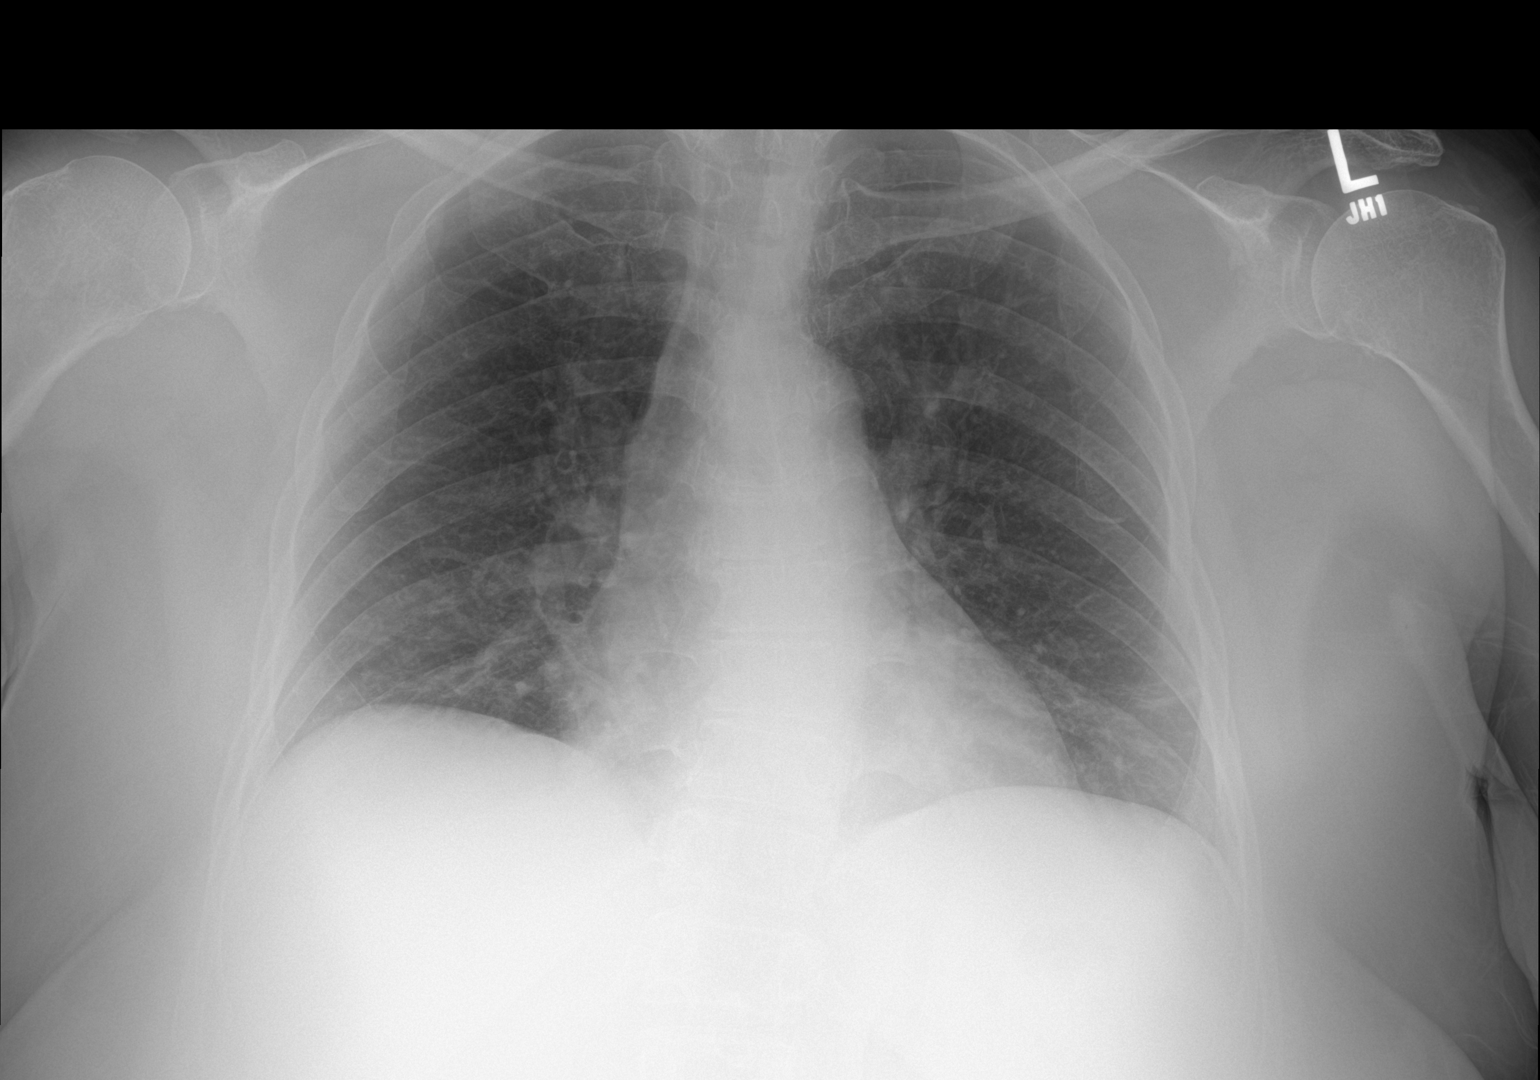

[1 of 1 positions shown; findings below may reference images not displayed]

FINDINGS: The heart size and mediastinal contours are within normal limits.
Streaky hazy airspace opacity seen at the left lung base. The right
lung is clear. The visualized skeletal structures are unremarkable.
IMPRESSION: Streaky hazy airspace opacity at the left lung base which may be due
to subsegmental atelectasis and/or early infectious etiology.

## 2021-07-19 ENCOUNTER — Encounter (HOSPITAL_COMMUNITY): Payer: Self-pay

## 2021-07-19 ENCOUNTER — Other Ambulatory Visit: Payer: Self-pay

## 2021-07-19 ENCOUNTER — Emergency Department (HOSPITAL_COMMUNITY)
Admission: EM | Admit: 2021-07-19 | Discharge: 2021-07-19 | Disposition: A | Discharge status: Home / Self Care | Payer: BLUE CROSS/BLUE SHIELD | Attending: Emergency Medicine | Admitting: Emergency Medicine

## 2021-07-19 DIAGNOSIS — F17210 Nicotine dependence, cigarettes, uncomplicated: Secondary | ICD-10-CM | POA: Insufficient documentation

## 2021-07-19 DIAGNOSIS — E119 Type 2 diabetes mellitus without complications: Secondary | ICD-10-CM | POA: Insufficient documentation

## 2021-07-19 DIAGNOSIS — Z4681 Encounter for fitting and adjustment of insulin pump: Secondary | ICD-10-CM | POA: Insufficient documentation

## 2021-07-19 DIAGNOSIS — Z7984 Long term (current) use of oral hypoglycemic drugs: Secondary | ICD-10-CM | POA: Diagnosis not present

## 2021-07-19 DIAGNOSIS — Z794 Long term (current) use of insulin: Secondary | ICD-10-CM | POA: Insufficient documentation

## 2021-07-19 DIAGNOSIS — Z8542 Personal history of malignant neoplasm of other parts of uterus: Secondary | ICD-10-CM | POA: Insufficient documentation

## 2021-07-19 NOTE — Discharge Instructions (Signed)
Follow-up with endocrinology to have transmitter replaced.

## 2021-07-19 NOTE — ED Provider Notes (Signed)
[facility] DEPT Provider Note   CSN: [id number] Arrival date & time: 07/19/21  3419     History Chief Complaint  Patient presents with   Dexcom issues    Kristy [name] is a 55 y.o. female.  HPI  Patient with history of diabetes presents due to Dexcom issue.  She was set up with a Dexcom device 10 days ago, states this time for the change out and she is unable to do it by herself.  She has not tried anything at home, she is not having any symptoms of movement.  She is followed by Margreta Journey with endocrinology, states that the nurse did not go over this with her in the office not currently set up.  Past Medical History:  Diagnosis Date   Anemia    Cancer ([place])    Chronic back pain    Diabetes mellitus type II    Hyperlipidemia    Kidney stones    Uterine cancer [facility])     Patient Active Problem List   Diagnosis Date Noted   Depressed mood 10/28/2016   Endometrial cancer ([place]) 07/28/2016   Abnormal uterine bleeding 12/30/2015   Chronic left shoulder pain 02/11/2015   Nail abnormality 09/16/2014   Left knee pain 06/23/2014   Microcytic anemia 07/11/2012   Paresthesia of left leg 07/08/2012   Low back pain 10/20/2011   Headache(784.0) 09/24/2011   GERD (gastroesophageal reflux disease) 09/02/2011   Chronic back pain 12/02/2010   Hyperlipidemia 12/01/2010   DM (diabetes mellitus), type 2, uncontrolled 12/01/2010   Obesity 12/01/2010    Past Surgical History:  Procedure Laterality Date   ABDOMINAL HYSTERECTOMY     CESAREAN SECTION     KIDNEY STONE SURGERY     NO PAST SURGERIES     TUBAL LIGATION       OB History     Gravida  2   Para  2   Term  2   Preterm      AB      Living  2      SAB      IAB      Ectopic      Multiple      Live Births              Family History  Adopted: Yes  Problem Relation Age of Onset   Alcohol abuse Neg Hx     Social History   Tobacco Use   Smoking status: Some Days     Packs/day: 0.25    Types: Cigarettes   Smokeless tobacco: Never   Tobacco comments:    one cigarette daily  Vaping Use   Vaping Use: Never used  Substance Use Topics   Alcohol use: Yes    Comment: Once a month   Drug use: No    Home Medications Prior to Admission medications   Medication Sig Start Date End Date Taking? Authorizing Provider  acetaminophen (TYLENOL) 500 MG tablet Take 1,000 mg by mouth every 6 (six) hours as needed for pain.    [provider]  glucose blood (AGAMATRIX PRESTO TEST) test strip Use as instructed 05/06/18   Martyn Malay, MD  ibuprofen (ADVIL) 600 MG tablet Take 1 tablet (600 mg total) by mouth every 6 (six) hours as needed. 05/06/20   Lamptey, Myrene Galas, MD  Insulin Glargine (LANTUS SOLOSTAR) 100 UNIT/ML Solostar Pen Inject 31 Units into the skin 2 (two) times daily. Patient taking differently: Inject 30 Units  into the skin 2 (two) times daily.  03/30/19   Martyn Malay, MD  insulin lispro (HUMALOG) 100 UNIT/ML KiwkPen Inject 0.1 mLs (10 Units total) into the skin 2 (two) times daily. Patient taking differently: Inject 12 Units into the skin 2 (two) times daily.  05/06/18   Martyn Malay, MD  Insulin Pen Needle (BD ULTRA-FINE PEN NEEDLES) 29G X 12.7MM MISC Check blood sugar 4x daily 05/06/18   Martyn Malay, MD  Lancets MISC Check BG prior to administering insulin 05/06/18   Martyn Malay, MD  metFORMIN (GLUCOPHAGE) 1000 MG tablet Take 1 tablet by mouth 2 (two) times daily. 09/28/19   [provider]  naproxen sodium (ANAPROX) 550 MG tablet Take 1 tablet (550 mg total) by mouth 2 (two) times daily with a meal. 1/66/06   Delora Fuel, MD    Allergies    Other, Fish allergy, and Oxycodone  Review of Systems   Review of Systems  Constitutional:  Negative for fever.  Skin:  Negative for wound.       Endocrine issue   Physical Exam Updated Vital Signs BP (!) 136/92 (BP Location: Left Arm)    Pulse 87    Temp 98.6 F (37 C)  (Oral)    Resp 16    Ht 4\' 11"  (1.499 m)    Wt 77.1 kg    LMP 07/14/2016 (Exact Date)    SpO2 99%    BMI 34.34 kg/m   Physical Exam Vitals and nursing note reviewed. Exam conducted with a chaperone present.  Constitutional:      General: She is not in acute distress.    Appearance: Normal appearance.  HENT:     Head: Normocephalic and atraumatic.  Eyes:     General: No scleral icterus.    Extraocular Movements: Extraocular movements intact.     Pupils: Pupils are equal, round, and reactive to light.  Skin:    Coloration: Skin is not jaundiced.     Comments: Dexcom in place to the left arm.    Neurological:     Mental Status: She is alert. Mental status is at baseline.     Coordination: Coordination normal.    ED Results / Procedures / Treatments   Labs (all labs ordered are listed, but only abnormal results are displayed) Labs Reviewed - No data to display  EKG None  Radiology No results found.  Procedures Procedures   Medications Ordered in ED Medications - No data to display  ED Course  I have reviewed the triage vital signs and the nursing notes.  Pertinent labs & imaging results that were available during my care of the patient were reviewed by me and considered in my medical decision making (see chart for details).    MDM Rules/Calculators/A&P                         Stable vitals, patient is not in any acute distress.  We were able to change out the Dexcom device, her app is currently not set up.  It states there is an issue with the transbetter and not the sensor.  We are unable to troubleshoot this in the ED, advised patient to continue taking her medication fashion we will shots and to follow-up with the endocrinologist on Monday to troubleshoot the device.     Final Clinical Impression(s) / ED Diagnoses Final diagnoses:  None    Rx / DC Orders ED Discharge Orders  None        Sherrill Raring, [place] 07/19/21 9747    Lacretia Leigh,  MD 07/20/21 [id number]

## 2021-07-19 NOTE — ED Triage Notes (Signed)
Patient states that it is time for her Dexcom to be changed. Patient states she is unsure how to remove and replace.

## 2021-11-12 ENCOUNTER — Encounter (HOSPITAL_COMMUNITY): Payer: Self-pay

## 2021-11-12 ENCOUNTER — Emergency Department (HOSPITAL_COMMUNITY): Payer: BLUE CROSS/BLUE SHIELD

## 2021-11-12 ENCOUNTER — Emergency Department (HOSPITAL_COMMUNITY)
Admission: EM | Admit: 2021-11-12 | Discharge: 2021-11-12 | Disposition: A | Discharge status: Home / Self Care | Payer: BLUE CROSS/BLUE SHIELD | Attending: Emergency Medicine | Admitting: Emergency Medicine

## 2021-11-12 ENCOUNTER — Other Ambulatory Visit: Payer: Self-pay

## 2021-11-12 DIAGNOSIS — J9811 Atelectasis: Secondary | ICD-10-CM | POA: Insufficient documentation

## 2021-11-12 DIAGNOSIS — E119 Type 2 diabetes mellitus without complications: Secondary | ICD-10-CM | POA: Insufficient documentation

## 2021-11-12 DIAGNOSIS — Z794 Long term (current) use of insulin: Secondary | ICD-10-CM | POA: Insufficient documentation

## 2021-11-12 DIAGNOSIS — M79605 Pain in left leg: Secondary | ICD-10-CM | POA: Insufficient documentation

## 2021-11-12 DIAGNOSIS — R0602 Shortness of breath: Secondary | ICD-10-CM | POA: Diagnosis present

## 2021-11-12 DIAGNOSIS — Z7984 Long term (current) use of oral hypoglycemic drugs: Secondary | ICD-10-CM | POA: Insufficient documentation

## 2021-11-12 LAB — CBC
HCT: 41.5 % (ref 36.0–46.0)
Hemoglobin: 14.4 g/dL (ref 12.0–15.0)
MCH: 26.6 pg (ref 26.0–34.0)
MCHC: 34.7 g/dL (ref 30.0–36.0)
MCV: 76.6 fL — ABNORMAL LOW (ref 80.0–100.0)
Platelets: 305 10*3/uL (ref 150–400)
RBC: 5.42 MIL/uL — ABNORMAL HIGH (ref 3.87–5.11)
RDW: 14.5 % (ref 11.5–15.5)
WBC: 8.5 10*3/uL (ref 4.0–10.5)
nRBC: 0 % (ref 0.0–0.2)

## 2021-11-12 LAB — BASIC METABOLIC PANEL
Anion gap: 7 (ref 5–15)
BUN: 11 mg/dL (ref 6–20)
CO2: 24 mmol/L (ref 22–32)
Calcium: 9.6 mg/dL (ref 8.9–10.3)
Chloride: 109 mmol/L (ref 98–111)
Creatinine, Ser: 0.75 mg/dL (ref 0.44–1.00)
GFR, Estimated: >60 mL/min (ref >60)
Glucose, Bld: 147 mg/dL — ABNORMAL HIGH (ref 70–99)
Potassium: 3.8 mmol/L (ref 3.5–5.1)
Sodium: 140 mmol/L (ref 135–145)

## 2021-11-12 LAB — TROPONIN I (HIGH SENSITIVITY): Troponin I (High Sensitivity): <2 ng/L (ref <18)

## 2021-11-12 MED ORDER — KETOROLAC TROMETHAMINE 30 MG/ML IJ SOLN
30.0000 mg | Freq: Once | INTRAMUSCULAR | Status: AC
  Administered 2021-11-12: 30 mg via INTRAVENOUS
  Filled 2021-11-12: qty 1

## 2021-11-12 MED ORDER — SODIUM CHLORIDE 0.9 % IV BOLUS
1000.0000 mL | Freq: Once | INTRAVENOUS | Status: AC
  Administered 2021-11-12: 1000 mL via INTRAVENOUS

## 2021-11-12 MED ORDER — IOHEXOL 350 MG/ML SOLN
100.0000 mL | Freq: Once | INTRAVENOUS | Status: AC | PRN
  Administered 2021-11-12: 100 mL via INTRAVENOUS

## 2021-11-12 MED ORDER — AZITHROMYCIN 250 MG PO TABS: 250.0000 mg | ORAL_TABLET | Freq: Every day | ORAL | 0 refills | Status: DC

## 2021-11-12 NOTE — ED Triage Notes (Signed)
Pt presents to ED with c/o left side leg pain x 1 week, intermittent left side arm pain x 1 week and endorses SOB that began today. Denies chest pain or dizziness. ?

## 2021-11-12 NOTE — Discharge Instructions (Addendum)
Your CT today did not show stroke but you may have a early pneumonia. ? ?Please take Z-Pak as prescribed. ? ?It is possible that you have a blood clot in the left leg.  I ordered an ultrasound to be done tomorrow. If they do not call you by noon please call the number provided ? ?Return to ER if you have worse shortness of breath, chest pain, leg pain or swelling ?

## 2021-11-12 NOTE — ED Provider Triage Note (Signed)
Emergency Medicine Provider Triage Evaluation Note ? ?Kristy [name] , a 56 y.o. female  was evaluated in triage.  Pt complains of shortness of breath x 1 day and chronic left arm and left leg pain. Pt reports while at work today she started having difficulty getting a full breath in. ? ?Review of Systems  ?Positive: As above ?Negative: Chest pain, fever, cough, numbness ? ?Physical Exam  ?BP 124/86 (BP Location: Left Arm)   Pulse 95   Temp 98.1 ?F (36.7 ?C) (Oral)   Resp 20   Ht '4\' 11"'$  (1.499 m)   Wt 77.1 kg   LMP 07/14/2016 (Exact Date)   SpO2 97%   BMI 34.34 kg/m?  ?Gen:   Awake, no distress   ?Resp:  Normal effort  ?MSK:   Moves extremities without difficulty  ?Other:   ? ?Medical Decision Making  ?Medically screening exam initiated at 8:14 PM.  Appropriate orders placed.  Kristy [name] was informed that the remainder of the evaluation will be completed by another provider, this initial triage assessment does not replace that evaluation, and the importance of remaining in the ED until their evaluation is complete. ? ? ?  ?Kristy Plummer, PA-C ?11/12/21 2014 ? ?

## 2021-11-12 NOTE — ED Provider Notes (Signed)
?[facility] DEPT ?Provider Note ? ? ?CSN: [id number] ?Arrival date & time: 11/12/21  1949 ? ?  ? ?History ? ?Chief Complaint  ?Patient presents with  ? Leg Pain  ? Shortness of Breath  ? ? ?Kamaya Keckler is a 56 y.o. female hx of DM, here with leg pain, SOB.  Patient states that she has been having left leg pain for the last several weeks.  Patient states that occasionally she wakes up at night from the pain. Patient states that [employer].  She states that she has been short of breath while at work today.  She was told that she needs to come to the ED for evaluation.  Patient is not on blood thinners.  No history of blood clots ? ?The history is provided by the patient.  ? ?  ? ?Home Medications ?Prior to Admission medications   ?Medication Sig Start Date End Date Taking? Authorizing Provider  ?acetaminophen (TYLENOL) 500 MG tablet Take 1,000 mg by mouth every 6 (six) hours as needed for pain.    [provider]  ?glucose blood (AGAMATRIX PRESTO TEST) test strip Use as instructed 05/06/18   Martyn Malay, MD  ?ibuprofen (ADVIL) 600 MG tablet Take 1 tablet (600 mg total) by mouth every 6 (six) hours as needed. 05/06/20   LampteyMyrene Galas, MD  ?Insulin Glargine (LANTUS SOLOSTAR) 100 UNIT/ML Solostar Pen Inject 31 Units into the skin 2 (two) times daily. ?Patient taking differently: Inject 30 Units into the skin 2 (two) times daily.  03/30/19   Martyn Malay, MD  ?insulin lispro (HUMALOG) 100 UNIT/ML KiwkPen Inject 0.1 mLs (10 Units total) into the skin 2 (two) times daily. ?Patient taking differently: Inject 12 Units into the skin 2 (two) times daily.  05/06/18   Martyn Malay, MD  ?Insulin Pen Needle (BD ULTRA-FINE PEN NEEDLES) 29G X 12.7MM MISC Check blood sugar 4x daily 05/06/18   Martyn Malay, MD  ?Lancets MISC Check BG prior to administering insulin 05/06/18   Martyn Malay, MD  ?metFORMIN (GLUCOPHAGE) 1000 MG tablet Take 1 tablet by mouth 2 (two) times daily.  09/28/19   [provider]  ?naproxen sodium (ANAPROX) 550 MG tablet Take 1 tablet (550 mg total) by mouth 2 (two) times daily with a meal. 03/29/39   Delora Fuel, MD  ?   ? ?Allergies    ?Other, Fish allergy, and Oxycodone   ? ?Review of Systems   ?Review of Systems  ?Respiratory:  Positive for shortness of breath.   ?All other systems reviewed and are negative. ? ?Physical Exam ?Updated Vital Signs ?BP 116/78   Pulse 84   Temp 98.1 ?F (36.7 ?C) (Oral)   Resp 15   Ht '4\' 11"'$  (1.499 m)   Wt 77.1 kg   LMP 07/14/2016 (Exact Date)   SpO2 92%   BMI 34.34 kg/m?  ?Physical Exam ?Vitals and nursing note reviewed.  ?Constitutional:   ?   Appearance: She is well-developed.  ?HENT:  ?   Head: Normocephalic.  ?Eyes:  ?   Extraocular Movements: Extraocular movements intact.  ?   Pupils: Pupils are equal, round, and reactive to light.  ?Cardiovascular:  ?   Rate and Rhythm: Normal rate and regular rhythm.  ?Pulmonary:  ?   Comments: Slightly tachypneic, diminished bilateral bases ?Abdominal:  ?   General: Bowel sounds are normal.  ?   Palpations: Abdomen is soft.  ?Musculoskeletal:     ?  General: Normal range of motion.  ?   Cervical back: Normal range of motion and neck supple.  ?   Comments: Mild left calf tenderness  ?Skin: ?   General: Skin is warm.  ?   Capillary Refill: Capillary refill takes less than 2 seconds.  ?Neurological:  ?   General: No focal deficit present.  ?   Mental Status: She is alert and oriented to person, place, and time.  ?Psychiatric:     ?   Mood and Affect: Mood normal.     ?   Behavior: Behavior normal.  ? ? ?ED Results / Procedures / Treatments   ?Labs ?(all labs ordered are listed, but only abnormal results are displayed) ?Labs Reviewed  ?CBC - Abnormal; Notable for the following components:  ?    Result Value  ? RBC 5.42 (*)   ? MCV 76.6 (*)   ? All other components within normal limits  ?BASIC METABOLIC PANEL - Abnormal; Notable for the following components:  ? Glucose, Bld 147  (*)   ? All other components within normal limits  ?TROPONIN I (HIGH SENSITIVITY)  ? ? ?EKG ?EKG Interpretation ? ?Date/Time:  Wednesday November 12 2021 21:23:13 EDT ?Ventricular Rate:  87 ?PR Interval:  155 ?QRS Duration: 87 ?QT Interval:  372 ?QTC Calculation: 448 ?R Axis:   2 ?Text Interpretation: Sinus rhythm No significant change since last tracing Confirmed by Wandra Arthurs [phone]) on 11/12/2021 9:56:20 PM ? ?Radiology ?DG Chest 2 View ? ?Result Date: 11/12/2021 ?CLINICAL DATA:  Shortness of breath EXAM: CHEST - 2 VIEW COMPARISON:  07/24/2020 FINDINGS: Cardiac size is within normal limits. Linear densities are seen in both lower lung fields, more so on the right side. This finding has not changed significantly. There are no new infiltrates or signs of pulmonary edema. There is no pleural effusion or pneumothorax. IMPRESSION: Linear densities in both lower lung fields may suggest scarring. There are no signs of pulmonary edema or new focal infiltrates. Electronically Signed   By: Elmer Picker M.D.   On: 11/12/2021 20:28  ? ?CT Angio Chest PE W and/or Wo Contrast ? ?Result Date: 11/12/2021 ?CLINICAL DATA:  Shortness of breath with chronic left arm and left leg pain. EXAM: CT ANGIOGRAPHY CHEST WITH CONTRAST TECHNIQUE: Multidetector CT imaging of the chest was performed using the standard protocol during bolus administration of intravenous contrast. Multiplanar CT image reconstructions and MIPs were obtained to evaluate the vascular anatomy. RADIATION DOSE REDUCTION: This exam was performed according to the departmental dose-optimization program which includes automated exposure control, adjustment of the mA and/or kV according to patient size and/or use of iterative reconstruction technique. CONTRAST:  159m OMNIPAQUE IOHEXOL 350 MG/ML SOLN COMPARISON:  Dec 13, 2003 FINDINGS: Cardiovascular: Satisfactory opacification of the pulmonary arteries to the segmental level. No evidence of pulmonary embolism. Normal  heart size. No pericardial effusion. Mediastinum/Nodes: No enlarged mediastinal, hilar, or axillary lymph nodes. Thyroid gland, trachea, and esophagus demonstrate no significant findings. Lungs/Pleura: Mild atelectasis is seen along the posterolateral aspect of the right lower lobe. There is no evidence of a pleural effusion or pneumothorax. Upper Abdomen: No acute abnormality. Musculoskeletal: No chest wall abnormality. No acute or significant osseous findings. Review of the MIP images confirms the above findings. IMPRESSION: 1. No CT evidence of pulmonary embolism. 2. Mild right lower lobe atelectasis. Electronically Signed   By: TVirgina NorfolkM.D.   On: 11/12/2021 22:23   ? ?Procedures ?Procedures  ? ? ?Medications Ordered in ED ?  Medications  ?ketorolac (TORADOL) 30 MG/ML injection 30 mg (30 mg Intravenous Given 11/12/21 2208)  ?sodium chloride 0.9 % bolus 1,000 mL (1,000 mLs Intravenous New Bag/Given 11/12/21 2207)  ?iohexol (OMNIPAQUE) 350 MG/ML injection 100 mL (100 mLs Intravenous Contrast Given 11/12/21 2214)  ? ? ?ED Course/ Medical Decision Making/ A&P ?  ?                        ?Medical Decision Making ?Trenace Coughlin is a 56 y.o. female here presenting with shortness of breath.  Patient has shortness of breath today and has left calf pain for several weeks.  Consider DVT and PE. Patient is hemodynamically stable.  We do not have vascular ultrasound tonight. We will get CTA chest.  We will get labs and troponin x1.  ?    ?11:15 PM ?CTA showed right lung base atelectasis.  Troponin x1 pending.  If negative will not need second troponin.  Patient anticipate discharge home with Z-Pak.  Patient can return in the morning for DVT study as an outpatient. Signed out to Dr. Dolly Rias in the ED.  ? ?Amount and/or Complexity of Data Reviewed ?Labs: ordered. Decision-making details documented in ED Course. ?Radiology: ordered and independent interpretation performed. Decision-making details documented in ED  Course. ? ?Risk ?Prescription drug management. ? ?Final Clinical Impression(s) / ED Diagnoses ?Final diagnoses:  ?None  ? ? ?Rx / DC Orders ?ED Discharge Orders   ? ? None  ? ?  ? ? ?  ?Drenda Freeze, MD ?11/12/21 [phone]

## 2021-11-12 NOTE — ED Provider Notes (Signed)
11:34 PM ?Assumed care from Dr. Darl Householder, please see their note for full history, physical and decision making until this point. In brief this is a 56 y.o. year old female who presented to the ED tonight with Leg Pain and Shortness of Breath ?    ?Here with likely bronchitis of some sort. Getting abx. Needs troponin as she has a component of chest pain. Already has received d/c instructions from previous physician, troponin negative, stable for discharge per their workup.  ? ?Discharge instructions, including strict return precautions for new or worsening symptoms, given. Patient and/or family verbalized understanding and agreement with the plan as described.  ? ?Labs, studies and imaging reviewed by myself and considered in medical decision making if ordered. Imaging interpreted by radiology. ? ?Labs Reviewed  ?CBC - Abnormal; Notable for the following components:  ?    Result Value  ? RBC 5.42 (*)   ? MCV 76.6 (*)   ? All other components within normal limits  ?BASIC METABOLIC PANEL - Abnormal; Notable for the following components:  ? Glucose, Bld 147 (*)   ? All other components within normal limits  ?TROPONIN I (HIGH SENSITIVITY)  ? ? ?CT Angio Chest PE W and/or Wo Contrast  ?Final Result  ?  ?DG Chest 2 View  ?Final Result  ?  ?LE VENOUS    (Results Pending)  ? ? ?No follow-ups on file. ? ?  ?Merrily Pew, MD ?11/12/21 2335 ? ?

## 2021-11-13 ENCOUNTER — Ambulatory Visit (HOSPITAL_COMMUNITY): Admission: RE | Admit: 2021-11-13 | Payer: BLUE CROSS/BLUE SHIELD | Source: Ambulatory Visit

## 2022-08-17 ENCOUNTER — Ambulatory Visit (HOSPITAL_COMMUNITY)
Admission: EM | Admit: 2022-08-17 | Discharge: 2022-08-17 | Disposition: A | Discharge status: Home / Self Care | Payer: BLUE CROSS/BLUE SHIELD | Attending: Internal Medicine | Admitting: Internal Medicine

## 2022-08-17 ENCOUNTER — Encounter (HOSPITAL_COMMUNITY): Payer: Self-pay | Admitting: Emergency Medicine

## 2022-08-17 DIAGNOSIS — H1033 Unspecified acute conjunctivitis, bilateral: Secondary | ICD-10-CM

## 2022-08-17 MED ORDER — ERYTHROMYCIN 5 MG/GM OP OINT: TOPICAL_OINTMENT | Freq: Two times a day (BID) | OPHTHALMIC | 0 refills | Status: AC

## 2022-08-17 MED ORDER — OLOPATADINE HCL 0.1 % OP SOLN: 1.0000 [drp] | Freq: Two times a day (BID) | OPHTHALMIC | 12 refills | Status: AC

## 2022-08-17 NOTE — ED Provider Notes (Signed)
[facility]    CSN: [id number] Arrival date & time: 08/17/22  1601      History   Chief Complaint Chief Complaint  Patient presents with   Eye Drainage    HPI Kristy [name] is a 57 y.o. female comes to the urgent care with 4-day history of right eye discharge.  Patient endorses persistence of symptoms over the past 4 days.  At discharge is yellowish in color.  Patient endorses itchiness of the right eye as well as a sensation of foreign body in the right eye.  She denies any light sensitivity.  Over the past couple of days she has noticed swelling of the right upper and lower eyelids.  No headaches or pain when looking in different directions.  No fever or chills.  No trauma to the right eye.   HPI  Past Medical History:  Diagnosis Date   Anemia    Cancer ([place])    Chronic back pain    Diabetes mellitus type II    Hyperlipidemia    Kidney stones    Uterine cancer [facility])     Patient Active Problem List   Diagnosis Date Noted   Depressed mood 10/28/2016   Endometrial cancer ([place]) 07/28/2016   Abnormal uterine bleeding 12/30/2015   Chronic left shoulder pain 02/11/2015   Nail abnormality 09/16/2014   Left knee pain 06/23/2014   Microcytic anemia 07/11/2012   Paresthesia of left leg 07/08/2012   Low back pain 10/20/2011   Headache(784.0) 09/24/2011   GERD (gastroesophageal reflux disease) 09/02/2011   Chronic back pain 12/02/2010   Hyperlipidemia 12/01/2010   DM (diabetes mellitus), type 2, uncontrolled 12/01/2010   Obesity 12/01/2010    Past Surgical History:  Procedure Laterality Date   ABDOMINAL HYSTERECTOMY     CESAREAN SECTION     KIDNEY STONE SURGERY     NO PAST SURGERIES     TUBAL LIGATION      OB History     Gravida  2   Para  2   Term  2   Preterm      AB      Living  2      SAB      IAB      Ectopic      Multiple      Live Births               Home Medications    Prior to Admission medications   Medication Sig  Start Date End Date Taking? Authorizing Provider  apixaban (ELIQUIS) 2.5 MG TABS tablet Take 2.5 mg by mouth 2 (two) times daily. 06/08/22  Yes [provider]  atorvastatin (LIPITOR) 80 MG tablet Take 1 tablet by mouth daily. 06/08/22  Yes [provider]  erythromycin ophthalmic ointment Place into the right eye in the morning and at bedtime for 5 days. Place a 1/2 inch ribbon of ointment into the lower eyelid. 08/17/22 08/22/22 Yes Nikira Kushnir, Myrene Galas, MD  meloxicam (MOBIC) 15 MG tablet Take 1 tablet by mouth daily. 06/08/22  Yes [provider]  olopatadine (PATANOL) 0.1 % ophthalmic solution Place 1 drop into the right eye 2 (two) times daily for 7 days. 08/17/22 08/24/22 Yes Reilley Latorre, Myrene Galas, MD  Semaglutide, 1 MG/DOSE, (OZEMPIC, 1 MG/DOSE,) 4 MG/3ML SOPN Inject 1 mg as directed once a week. 07/28/22  Yes [provider]  Vitamin D, Ergocalciferol, (DRISDOL) 1.25 MG (50000 UNIT) CAPS capsule Take 50,000 Units by mouth once a week.  06/09/22  Yes [provider]  acetaminophen (TYLENOL) 500 MG tablet Take 1,000 mg by mouth every 6 (six) hours as needed for pain.    [provider]  glucose blood (AGAMATRIX PRESTO TEST) test strip Use as instructed 05/06/18   Martyn Malay, MD  ibuprofen (ADVIL) 600 MG tablet Take 1 tablet (600 mg total) by mouth every 6 (six) hours as needed. 05/06/20   Dorman Calderwood, Myrene Galas, MD  Insulin Glargine (LANTUS SOLOSTAR) 100 UNIT/ML Solostar Pen Inject 31 Units into the skin 2 (two) times daily. Patient taking differently: Inject 30 Units into the skin 2 (two) times daily.  03/30/19   Martyn Malay, MD  insulin lispro (HUMALOG) 100 UNIT/ML KiwkPen Inject 0.1 mLs (10 Units total) into the skin 2 (two) times daily. Patient taking differently: Inject 12 Units into the skin 2 (two) times daily.  05/06/18   Martyn Malay, MD  Insulin Pen Needle (BD ULTRA-FINE PEN NEEDLES) 29G X 12.7MM MISC Check blood sugar 4x daily 05/06/18    Martyn Malay, MD  Lancets MISC Check BG prior to administering insulin 05/06/18   Martyn Malay, MD  lisinopril (ZESTRIL) 2.5 MG tablet Take 2.5 mg by mouth daily.    [provider]  metFORMIN (GLUCOPHAGE) 1000 MG tablet Take 1 tablet by mouth 2 (two) times daily. 09/28/19   [provider]    Family History Family History  Adopted: Yes  Problem Relation Age of Onset   Alcohol abuse Neg Hx     Social History Social History   Tobacco Use   Smoking status: Some Days    Packs/day: 0.25    Types: Cigarettes   Smokeless tobacco: Never   Tobacco comments:    one cigarette daily  Vaping Use   Vaping Use: Never used  Substance Use Topics   Alcohol use: Yes    Comment: Once a month   Drug use: No     Allergies   Other, Fish allergy, and Oxycodone   Review of Systems Review of Systems  HENT: Negative.    Eyes:  Positive for discharge, redness and itching. Negative for photophobia, pain and visual disturbance.  Respiratory: Negative.       Physical Exam Triage Vital Signs ED Triage Vitals  Enc Vitals Group     BP 08/17/22 1036 133/79     Pulse Rate 08/17/22 1036 82     Resp 08/17/22 1036 16     Temp 08/17/22 1036 98 F (36.7 C)     Temp Source 08/17/22 1036 Oral     SpO2 08/17/22 1036 96 %     Weight --      Height --      Head Circumference --      Peak Flow --      Pain Score 08/17/22 1034 3     Pain Loc --      Pain Edu? --      Excl. in [place]? --    No data found.  Updated Vital Signs BP 133/79 (BP Location: Left Arm)   Pulse 82   Temp 98 F (36.7 C) (Oral)   Resp 16   LMP 07/14/2016 (Exact Date)   SpO2 96%   Visual Acuity Right Eye Distance:   Left Eye Distance:   Bilateral Distance:    Right Eye Near:   Left Eye Near:    Bilateral Near:     Physical Exam Vitals and nursing note reviewed.  Constitutional:  General: She is not in acute distress.    Appearance: She is not ill-appearing.  HENT:     Right Ear:  Tympanic membrane normal.     Left Ear: Tympanic membrane normal.     Nose: Nose normal.     Mouth/Throat:     Mouth: Mucous membranes are moist.     Pharynx: No posterior oropharyngeal erythema.  Eyes:     Pupils: Pupils are equal, round, and reactive to light.     Comments: Right upper and lower eyelid swelling.  Right conjunctival erythema. Arcus senilis present.  Cardiovascular:     Rate and Rhythm: Normal rate and regular rhythm.     Pulses: Normal pulses.     Heart sounds: Normal heart sounds.  Neurological:     Mental Status: She is alert.      UC Treatments / Results  Labs (all labs ordered are listed, but only abnormal results are displayed) Labs Reviewed - No data to display  EKG   Radiology No results found.  Procedures Procedures (including critical care time)  Medications Ordered in UC Medications - No data to display  Initial Impression / Assessment and Plan / UC Course  I have reviewed the triage vital signs and the nursing notes.  Pertinent labs & imaging results that were available during my care of the patient were reviewed by me and considered in my medical decision making (see chart for details).     1.  Acute conjunctivitis: Patanol eyedrops as needed for itching Erythromycin eye ointment Tylenol as needed for pain Return precautions given. Final Clinical Impressions(s) / UC Diagnoses   Final diagnoses:  Acute conjunctivitis of both eyes, unspecified acute conjunctivitis type     Discharge Instructions      Please use medications as prescribed You may take Tylenol as needed for pain. If you have worsening eye pain, blurry vision or light sensitivity please return to urgent care to be reevaluated.   ED Prescriptions     Medication Sig Dispense Auth. Provider   olopatadine (PATANOL) 0.1 % ophthalmic solution Place 1 drop into the right eye 2 (two) times daily for 7 days. 5 mL Aldyn Toon, Myrene Galas, MD   erythromycin ophthalmic ointment  Place into the right eye in the morning and at bedtime for 5 days. Place a 1/2 inch ribbon of ointment into the lower eyelid. 3.5 g Lakeita Panther, Myrene Galas, MD      PDMP not reviewed this encounter.   Chase Picket, MD 08/17/22 [phone]

## 2022-08-17 NOTE — ED Triage Notes (Signed)
Pt c/o right eye irritation since Thursday last week. Reports itching, watering.

## 2022-08-17 NOTE — Discharge Instructions (Addendum)
Please use medications as prescribed You may take Tylenol as needed for pain. If you have worsening eye pain, blurry vision or light sensitivity please return to urgent care to be reevaluated.

## 2022-12-05 ENCOUNTER — Encounter (HOSPITAL_BASED_OUTPATIENT_CLINIC_OR_DEPARTMENT_OTHER): Payer: Self-pay | Admitting: Emergency Medicine

## 2022-12-05 ENCOUNTER — Other Ambulatory Visit: Payer: Self-pay

## 2022-12-05 ENCOUNTER — Emergency Department (HOSPITAL_BASED_OUTPATIENT_CLINIC_OR_DEPARTMENT_OTHER)
Admission: EM | Admit: 2022-12-05 | Discharge: 2022-12-05 | Discharge status: Against Medical Advice | Payer: BLUE CROSS/BLUE SHIELD | Attending: Emergency Medicine | Admitting: Emergency Medicine

## 2022-12-05 DIAGNOSIS — Z5321 Procedure and treatment not carried out due to patient leaving prior to being seen by health care provider: Secondary | ICD-10-CM | POA: Insufficient documentation

## 2022-12-05 DIAGNOSIS — R519 Headache, unspecified: Secondary | ICD-10-CM | POA: Diagnosis present

## 2022-12-05 NOTE — ED Triage Notes (Signed)
Pt c/o headache and head cold x 2 weeks. Pt states that she has been taking advil and the headache goes away but comes back.

## 2022-12-26 ENCOUNTER — Encounter (HOSPITAL_BASED_OUTPATIENT_CLINIC_OR_DEPARTMENT_OTHER): Payer: Self-pay | Admitting: Emergency Medicine

## 2022-12-26 ENCOUNTER — Other Ambulatory Visit: Payer: Self-pay

## 2022-12-26 ENCOUNTER — Emergency Department (HOSPITAL_BASED_OUTPATIENT_CLINIC_OR_DEPARTMENT_OTHER): Payer: BLUE CROSS/BLUE SHIELD | Admitting: Radiology

## 2022-12-26 ENCOUNTER — Emergency Department (HOSPITAL_BASED_OUTPATIENT_CLINIC_OR_DEPARTMENT_OTHER)
Admission: EM | Admit: 2022-12-26 | Discharge: 2022-12-26 | Disposition: A | Discharge status: Home / Self Care | Payer: BLUE CROSS/BLUE SHIELD | Attending: Emergency Medicine | Admitting: Emergency Medicine

## 2022-12-26 DIAGNOSIS — M25511 Pain in right shoulder: Secondary | ICD-10-CM | POA: Diagnosis present

## 2022-12-26 DIAGNOSIS — Z7984 Long term (current) use of oral hypoglycemic drugs: Secondary | ICD-10-CM | POA: Diagnosis not present

## 2022-12-26 DIAGNOSIS — W19XXXA Unspecified fall, initial encounter: Secondary | ICD-10-CM | POA: Insufficient documentation

## 2022-12-26 DIAGNOSIS — R739 Hyperglycemia, unspecified: Secondary | ICD-10-CM

## 2022-12-26 DIAGNOSIS — M779 Enthesopathy, unspecified: Secondary | ICD-10-CM | POA: Insufficient documentation

## 2022-12-26 DIAGNOSIS — Z7901 Long term (current) use of anticoagulants: Secondary | ICD-10-CM | POA: Insufficient documentation

## 2022-12-26 DIAGNOSIS — Z794 Long term (current) use of insulin: Secondary | ICD-10-CM | POA: Insufficient documentation

## 2022-12-26 DIAGNOSIS — E1165 Type 2 diabetes mellitus with hyperglycemia: Secondary | ICD-10-CM | POA: Diagnosis not present

## 2022-12-26 DIAGNOSIS — M67911 Unspecified disorder of synovium and tendon, right shoulder: Secondary | ICD-10-CM

## 2022-12-26 LAB — CBC
HCT: 40.7 % (ref 36.0–46.0)
Hemoglobin: 14.3 g/dL (ref 12.0–15.0)
MCH: 26.1 pg (ref 26.0–34.0)
MCHC: 35.1 g/dL (ref 30.0–36.0)
MCV: 74.4 fL — ABNORMAL LOW (ref 80.0–100.0)
Platelets: 278 10*3/uL (ref 150–400)
RBC: 5.47 MIL/uL — ABNORMAL HIGH (ref 3.87–5.11)
RDW: 14.1 % (ref 11.5–15.5)
WBC: 6.2 10*3/uL (ref 4.0–10.5)
nRBC: 0 % (ref 0.0–0.2)

## 2022-12-26 LAB — CBG MONITORING, ED
Glucose-Capillary: 566 mg/dL (ref 70–99)
Glucose-Capillary: 367 mg/dL — ABNORMAL HIGH (ref 70–99)
Glucose-Capillary: 298 mg/dL — ABNORMAL HIGH (ref 70–99)

## 2022-12-26 LAB — BASIC METABOLIC PANEL
Anion gap: 9 (ref 5–15)
BUN: 15 mg/dL (ref 6–20)
CO2: 25 mmol/L (ref 22–32)
Calcium: 10.3 mg/dL (ref 8.9–10.3)
Chloride: 94 mmol/L — ABNORMAL LOW (ref 98–111)
Creatinine, Ser: 0.83 mg/dL (ref 0.44–1.00)
GFR, Estimated: >60 mL/min (ref >60)
Glucose, Bld: 634 mg/dL (ref 70–99)
Potassium: 4.5 mmol/L (ref 3.5–5.1)
Sodium: 128 mmol/L — ABNORMAL LOW (ref 135–145)

## 2022-12-26 MED ORDER — LACTATED RINGERS IV BOLUS
1000.0000 mL | Freq: Once | INTRAVENOUS | Status: AC
  Administered 2022-12-26: 1000 mL via INTRAVENOUS

## 2022-12-26 MED ORDER — INSULIN ASPART 100 UNIT/ML IJ SOLN
15.0000 [IU] | Freq: Once | INTRAMUSCULAR | Status: AC
  Administered 2022-12-26: 15 [IU] via INTRAVENOUS

## 2022-12-26 MED ORDER — ACETAMINOPHEN 500 MG PO TABS: 1000.0000 mg | ORAL_TABLET | Freq: Three times a day (TID) | ORAL | 0 refills | Status: DC | PRN

## 2022-12-26 MED ORDER — INSULIN REGULAR HUMAN 100 UNIT/ML IJ SOLN: 15.0000 [IU] | Freq: Once | INTRAMUSCULAR | Status: DC

## 2022-12-26 NOTE — ED Triage Notes (Signed)
Right shoulder pain x 1 month. Reports fall about 3 weeks ago, which may have made it worse. Also request getting her blood sugar checked, it has been high

## 2022-12-26 NOTE — ED Notes (Signed)
Dr. Rhunette Croft aware that pt's blood sugar is 634.

## 2022-12-26 NOTE — ED Provider Notes (Signed)
[place] EMERGENCY DEPARTMENT AT [facility] Provider Note   CSN: [id number] Arrival date & time: 12/26/22  1826     History {Add pertinent medical, surgical, social history, OB history to HPI:1} Chief Complaint  Patient presents with   Shoulder Pain   Hyperglycemia    Kristy [name] is a 57 y.o. female.  HPI    Patient comes in with chief complaint of shoulder pain and elevated blood sugar. Home Medications Prior to Admission medications   Medication Sig Start Date End Date Taking? Authorizing Provider  acetaminophen (TYLENOL) 500 MG tablet Take 1,000 mg by mouth every 6 (six) hours as needed for pain.    [provider]  apixaban (ELIQUIS) 2.5 MG TABS tablet Take 2.5 mg by mouth 2 (two) times daily. 06/08/22   [provider]  atorvastatin (LIPITOR) 80 MG tablet Take 1 tablet by mouth daily. 06/08/22   [provider]  glucose blood (AGAMATRIX PRESTO TEST) test strip Use as instructed 05/06/18   Westley Chandler, MD  ibuprofen (ADVIL) 600 MG tablet Take 1 tablet (600 mg total) by mouth every 6 (six) hours as needed. 05/06/20   Lamptey, Britta Mccreedy, MD  Insulin Glargine (LANTUS SOLOSTAR) 100 UNIT/ML Solostar Pen Inject 31 Units into the skin 2 (two) times daily. Patient taking differently: Inject 30 Units into the skin 2 (two) times daily.  03/30/19   Westley Chandler, MD  insulin lispro (HUMALOG) 100 UNIT/ML KiwkPen Inject 0.1 mLs (10 Units total) into the skin 2 (two) times daily. Patient taking differently: Inject 12 Units into the skin 2 (two) times daily.  05/06/18   Westley Chandler, MD  Insulin Pen Needle (BD ULTRA-FINE PEN NEEDLES) 29G X 12.7MM MISC Check blood sugar 4x daily 05/06/18   Westley Chandler, MD  Lancets MISC Check BG prior to administering insulin 05/06/18   Westley Chandler, MD  lisinopril (ZESTRIL) 2.5 MG tablet Take 2.5 mg by mouth daily.    [provider]  meloxicam (MOBIC) 15 MG tablet Take 1 tablet by mouth daily.  06/08/22   [provider]  metFORMIN (GLUCOPHAGE) 1000 MG tablet Take 1 tablet by mouth 2 (two) times daily. 09/28/19   [provider]  Semaglutide, 1 MG/DOSE, (OZEMPIC, 1 MG/DOSE,) 4 MG/3ML SOPN Inject 1 mg as directed once a week. 07/28/22   [provider]  Vitamin D, Ergocalciferol, (DRISDOL) 1.25 MG (50000 UNIT) CAPS capsule Take 50,000 Units by mouth once a week. 06/09/22   [provider]      Allergies    Other, Fish allergy, and Oxycodone    Review of Systems   Review of Systems  Physical Exam Updated Vital Signs BP (!) 127/90   Pulse 94   Temp 98.8 F (37.1 C) (Oral)   Resp 18   LMP 07/14/2016 (Exact Date)   SpO2 95%  Physical Exam  ED Results / Procedures / Treatments   Labs (all labs ordered are listed, but only abnormal results are displayed) Labs Reviewed  BASIC METABOLIC PANEL - Abnormal; Notable for the following components:      Result Value   Sodium 128 (*)    Chloride 94 (*)    Glucose, Bld 634 (*)    All other components within normal limits  CBC - Abnormal; Notable for the following components:   RBC 5.47 (*)    MCV 74.4 (*)    All other components within normal limits  CBG MONITORING, ED - Abnormal; Notable for the  following components:   Glucose-Capillary 566 (*)    All other components within normal limits  CBG MONITORING, ED - Abnormal; Notable for the following components:   Glucose-Capillary 367 (*)    All other components within normal limits  CBG MONITORING, ED - Abnormal; Notable for the following components:   Glucose-Capillary 298 (*)    All other components within normal limits  CBG MONITORING, ED    EKG None  Radiology DG Shoulder Right  Result Date: 12/26/2022 CLINICAL DATA:  Right shoulder pain for 1 month. Fall 3 weeks ago, worsening pain. EXAM: RIGHT SHOULDER - 2+ VIEW COMPARISON:  None Available. FINDINGS: No acute or healing fracture of the right shoulder. Normal alignment without  dislocation. There is acromioclavicular degenerative change with inferior spurring. Mild inferior glenoid spurring. Subcortical cystic change in the lateral humeral head with small soft tissue calcification in the region of the rotator cuff. No erosive or bony destructive change. IMPRESSION: 1. No fracture or dislocation of the right shoulder. 2. Mild acromioclavicular and glenohumeral osteoarthritis. 3. Subcortical cystic change in the lateral humeral head with small soft tissue calcification in the region of the rotator cuff, suggesting calcific tendinopathy. Electronically Signed   By: Narda Rutherford M.D.   On: 12/26/2022 19:15    Procedures Procedures  {Document cardiac monitor, telemetry assessment procedure when appropriate:1}  Medications Ordered in ED Medications  lactated ringers bolus 1,000 mL (0 mLs Intravenous Stopped 12/26/22 2116)  insulin aspart (novoLOG) injection 15 Units (15 Units Intravenous Given 12/26/22 2117)    ED Course/ Medical Decision Making/ A&P   {   Click here for ABCD2, HEART and other calculatorsREFRESH Note before signing :1}                          Medical Decision Making Amount and/or Complexity of Data Reviewed Labs: ordered. Radiology: ordered.  Risk Prescription drug management.   ***  {Document critical care time when appropriate:1} {Document review of labs and clinical decision tools ie heart score, Chads2Vasc2 etc:1}  {Document your independent review of radiology images, and any outside records:1} {Document your discussion with family members, caretakers, and with consultants:1} {Document social determinants of health affecting pt's care:1} {Document your decision making why or why not admission, treatments were needed:1} Final Clinical Impression(s) / ED Diagnoses Final diagnoses:  Tendinopathy of right shoulder    Rx / DC Orders ED Discharge Orders     None

## 2022-12-26 NOTE — ED Notes (Signed)
Date and time results received: 12/26/22 1910 (use smartphrase ".now" to insert current time)  Test: Glucose Critical Value: 633  Name of Provider Notified: Nanavati  Orders Received? Or Actions Taken?:

## 2022-12-26 NOTE — Discharge Instructions (Addendum)
It appears that you have shoulder tendinopathy that is causing the pain.  Please take the medication prescribed for symptom management.  Please discuss your shoulder pain with your primary care doctor to see if you need physical therapy or referral to an orthopedist.  Your sugar was also elevated, but there is no complication and no evidence of infection.  Follow-up with your primary care doctor for optimal management of your blood sugar.  Make sure you are checking your blood sugar regularly at home as well.

## 2023-06-21 LAB — COLOGUARD: COLOGUARD: NEGATIVE

## 2023-08-23 ENCOUNTER — Emergency Department (HOSPITAL_COMMUNITY)
Admission: EM | Admit: 2023-08-23 | Discharge: 2023-08-24 | Discharge status: Against Medical Advice | Payer: BLUE CROSS/BLUE SHIELD | Attending: Emergency Medicine | Admitting: Emergency Medicine

## 2023-08-23 DIAGNOSIS — L02223 Furuncle of chest wall: Secondary | ICD-10-CM | POA: Insufficient documentation

## 2023-08-23 DIAGNOSIS — L02213 Cutaneous abscess of chest wall: Secondary | ICD-10-CM | POA: Diagnosis not present

## 2023-08-23 DIAGNOSIS — Z5321 Procedure and treatment not carried out due to patient leaving prior to being seen by health care provider: Secondary | ICD-10-CM | POA: Insufficient documentation

## 2023-08-23 DIAGNOSIS — Z79899 Other long term (current) drug therapy: Secondary | ICD-10-CM | POA: Diagnosis not present

## 2023-08-23 DIAGNOSIS — Z7984 Long term (current) use of oral hypoglycemic drugs: Secondary | ICD-10-CM | POA: Diagnosis not present

## 2023-08-23 DIAGNOSIS — N644 Mastodynia: Secondary | ICD-10-CM | POA: Diagnosis present

## 2023-08-23 DIAGNOSIS — Z7901 Long term (current) use of anticoagulants: Secondary | ICD-10-CM | POA: Diagnosis not present

## 2023-08-23 DIAGNOSIS — Z794 Long term (current) use of insulin: Secondary | ICD-10-CM | POA: Diagnosis not present

## 2023-08-23 NOTE — ED Triage Notes (Signed)
Pt reports boil to underneath right breast, pt reports hx of boils , last one, 2 years ago. Pt c/o pain 10/10

## 2023-08-24 ENCOUNTER — Encounter (HOSPITAL_BASED_OUTPATIENT_CLINIC_OR_DEPARTMENT_OTHER): Payer: Self-pay

## 2023-08-24 ENCOUNTER — Other Ambulatory Visit: Payer: Self-pay

## 2023-08-24 ENCOUNTER — Emergency Department (HOSPITAL_BASED_OUTPATIENT_CLINIC_OR_DEPARTMENT_OTHER)
Admission: EM | Admit: 2023-08-24 | Discharge: 2023-08-24 | Disposition: A | Discharge status: Home / Self Care | Payer: BLUE CROSS/BLUE SHIELD | Attending: Emergency Medicine | Admitting: Emergency Medicine

## 2023-08-24 DIAGNOSIS — Z79899 Other long term (current) drug therapy: Secondary | ICD-10-CM | POA: Insufficient documentation

## 2023-08-24 DIAGNOSIS — Z794 Long term (current) use of insulin: Secondary | ICD-10-CM | POA: Insufficient documentation

## 2023-08-24 DIAGNOSIS — L02213 Cutaneous abscess of chest wall: Secondary | ICD-10-CM | POA: Insufficient documentation

## 2023-08-24 DIAGNOSIS — Z7901 Long term (current) use of anticoagulants: Secondary | ICD-10-CM | POA: Insufficient documentation

## 2023-08-24 DIAGNOSIS — L02223 Furuncle of chest wall: Secondary | ICD-10-CM | POA: Diagnosis not present

## 2023-08-24 DIAGNOSIS — Z7984 Long term (current) use of oral hypoglycemic drugs: Secondary | ICD-10-CM | POA: Insufficient documentation

## 2023-08-24 MED ORDER — LIDOCAINE-EPINEPHRINE (PF) 2 %-1:200000 IJ SOLN
10.0000 mL | Freq: Once | INTRAMUSCULAR | Status: DC
  Filled 2023-08-24: qty 20

## 2023-08-24 MED ORDER — DOXYCYCLINE HYCLATE 100 MG PO CAPS: 100.0000 mg | ORAL_CAPSULE | Freq: Two times a day (BID) | ORAL | 0 refills | Status: DC

## 2023-08-24 MED ORDER — DOXYCYCLINE HYCLATE 100 MG PO TABS
100.0000 mg | ORAL_TABLET | Freq: Once | ORAL | Status: AC
  Administered 2023-08-24: 100 mg via ORAL
  Filled 2023-08-24: qty 1

## 2023-08-24 NOTE — ED Notes (Signed)
Pt has left ama

## 2023-08-24 NOTE — ED Provider Notes (Signed)
[place] EMERGENCY DEPARTMENT AT [facility]  Provider Note  CSN: [id number] Arrival date & time: 08/24/23 0018  History Chief Complaint  Patient presents with   Abscess    Ailanie [name] is a 58 y.o. female patient reports worsening pain from a boil under her R breast for the last 2 weeks. Has had similar before that usual drain on their own, but this one has been persistent. No fevers.    Home Medications Prior to Admission medications   Medication Sig Start Date End Date Taking? Authorizing Provider  doxycycline (VIBRAMYCIN) 100 MG capsule Take 1 capsule (100 mg total) by mouth 2 (two) times daily. 08/24/23  Yes Pollyann Savoy, MD  acetaminophen (TYLENOL) 500 MG tablet Take 2 tablets (1,000 mg total) by mouth every 8 (eight) hours as needed for moderate pain. 12/26/22   Derwood Kaplan, MD  apixaban (ELIQUIS) 2.5 MG TABS tablet Take 2.5 mg by mouth 2 (two) times daily. 06/08/22   [provider]  atorvastatin (LIPITOR) 80 MG tablet Take 1 tablet by mouth daily. 06/08/22   [provider]  glucose blood (AGAMATRIX PRESTO TEST) test strip Use as instructed 05/06/18   Westley Chandler, MD  ibuprofen (ADVIL) 600 MG tablet Take 1 tablet (600 mg total) by mouth every 6 (six) hours as needed. 05/06/20   Lamptey, Britta Mccreedy, MD  Insulin Glargine (LANTUS SOLOSTAR) 100 UNIT/ML Solostar Pen Inject 31 Units into the skin 2 (two) times daily. Patient taking differently: Inject 30 Units into the skin 2 (two) times daily.  03/30/19   Westley Chandler, MD  insulin lispro (HUMALOG) 100 UNIT/ML KiwkPen Inject 0.1 mLs (10 Units total) into the skin 2 (two) times daily. Patient taking differently: Inject 12 Units into the skin 2 (two) times daily.  05/06/18   Westley Chandler, MD  Insulin Pen Needle (BD ULTRA-FINE PEN NEEDLES) 29G X 12.7MM MISC Check blood sugar 4x daily 05/06/18   Westley Chandler, MD  Lancets MISC Check BG prior to administering insulin 05/06/18   Westley Chandler,  MD  lisinopril (ZESTRIL) 2.5 MG tablet Take 2.5 mg by mouth daily.    [provider]  meloxicam (MOBIC) 15 MG tablet Take 1 tablet by mouth daily. 06/08/22   [provider]  metFORMIN (GLUCOPHAGE) 1000 MG tablet Take 1 tablet by mouth 2 (two) times daily. 09/28/19   [provider]  Semaglutide, 1 MG/DOSE, (OZEMPIC, 1 MG/DOSE,) 4 MG/3ML SOPN Inject 1 mg as directed once a week. 07/28/22   [provider]  Vitamin D, Ergocalciferol, (DRISDOL) 1.25 MG (50000 UNIT) CAPS capsule Take 50,000 Units by mouth once a week. 06/09/22   [provider]     Allergies    Other, Fish allergy, and Oxycodone   Review of Systems   Review of Systems Please see HPI for pertinent positives and negatives  Physical Exam BP (!) 141/97 (BP Location: Left Arm)   Pulse 95   Temp 98.2 F (36.8 C) (Oral)   Resp 18   Wt 78.5 kg   LMP 07/14/2016 (Exact Date)   SpO2 99%   BMI 34.94 kg/m   Physical Exam Vitals and nursing note reviewed. Exam conducted with a chaperone present.  HENT:     Head: Normocephalic.     Nose: Nose normal.  Eyes:     Extraocular Movements: Extraocular movements intact.  Pulmonary:     Effort: Pulmonary effort is normal.     Comments: 2cm x 3 cm  area of induration, erythema and fluctuance under R breast Musculoskeletal:        General: Normal range of motion.     Cervical back: Neck supple.  Skin:    Findings: No rash (on exposed skin).  Neurological:     Mental Status: She is alert and oriented to person, place, and time.  Psychiatric:        Mood and Affect: Mood normal.     ED Results / Procedures / Treatments   EKG None  Procedures .Incision and Drainage  Date/Time: 08/24/2023 2:00 AM  Performed by: Pollyann Savoy, MD Authorized by: Pollyann Savoy, MD   Consent:    Consent obtained:  Verbal   Consent given by:  Patient Location:    Type:  Abscess   Location:  Trunk   Trunk location:  Chest Pre-procedure  details:    Skin preparation:  Chlorhexidine Anesthesia:    Anesthesia method:  Local infiltration   Local anesthetic:  Lidocaine 2% WITH epi Procedure type:    Complexity:  Simple Procedure details:    Incision types:  Single straight   Wound management:  Probed and deloculated   Drainage:  Purulent   Drainage amount:  Copious   Wound treatment:  Drain placed   Packing materials:  1/2 in iodoform gauze Post-procedure details:    Procedure completion:  Tolerated well, no immediate complications   Medications Ordered in the ED Medications  lidocaine-EPINEPHrine (XYLOCAINE W/EPI) 2 %-1:200000 (PF) injection 10 mL (has no administration in time range)  doxycycline (VIBRA-TABS) tablet 100 mg (has no administration in time range)    Initial Impression and Plan  Patient here with cutaneous abscess, drained as above. Wound check/packing removal in 2-3 days.  Rx for doxycycline. Wound care instructions given.   ED Course       MDM Rules/Calculators/A&P Medical Decision Making Problems Addressed: Abscess of chest wall: acute illness or injury  Risk Prescription drug management.     Final Clinical Impression(s) / ED Diagnoses Final diagnoses:  Abscess of chest wall    Rx / DC Orders ED Discharge Orders          Ordered    doxycycline (VIBRAMYCIN) 100 MG capsule  2 times daily        08/24/23 0201             Pollyann Savoy, MD 08/24/23 0201

## 2023-08-24 NOTE — ED Triage Notes (Signed)
Pt reports abscess under right breast that has been there for approx 2 weeks.

## 2023-10-05 ENCOUNTER — Other Ambulatory Visit: Payer: Self-pay

## 2023-10-05 DIAGNOSIS — Z5321 Procedure and treatment not carried out due to patient leaving prior to being seen by health care provider: Secondary | ICD-10-CM | POA: Diagnosis not present

## 2023-10-05 DIAGNOSIS — U071 COVID-19: Secondary | ICD-10-CM | POA: Insufficient documentation

## 2023-10-05 DIAGNOSIS — R059 Cough, unspecified: Secondary | ICD-10-CM | POA: Diagnosis present

## 2023-10-05 LAB — CBG MONITORING, ED: Glucose-Capillary: 271 mg/dL — ABNORMAL HIGH (ref 70–99)

## 2023-10-06 ENCOUNTER — Emergency Department (HOSPITAL_BASED_OUTPATIENT_CLINIC_OR_DEPARTMENT_OTHER)
Admission: EM | Admit: 2023-10-06 | Discharge: 2023-10-06 | Discharge status: Against Medical Advice | Attending: Emergency Medicine | Admitting: Emergency Medicine

## 2023-10-06 ENCOUNTER — Encounter (HOSPITAL_BASED_OUTPATIENT_CLINIC_OR_DEPARTMENT_OTHER): Payer: Self-pay | Admitting: Emergency Medicine

## 2023-10-06 LAB — RESP PANEL BY RT-PCR (RSV, FLU A&B, COVID)  RVPGX2
Influenza A by PCR: NEGATIVE
Influenza B by PCR: NEGATIVE
Resp Syncytial Virus by PCR: NEGATIVE
SARS Coronavirus 2 by RT PCR: POSITIVE — AB

## 2023-10-06 NOTE — ED Notes (Signed)
 Pt left prior to being called to treatment room.

## 2023-10-06 NOTE — ED Triage Notes (Signed)
 Patient coming to ED for evaluation cough, body aches, chills, and HA.  Reports symptoms started yesterday.  Has used OTC medications without relief.  No reports of fevers.

## 2024-01-01 ENCOUNTER — Emergency Department (HOSPITAL_COMMUNITY)

## 2024-01-01 ENCOUNTER — Other Ambulatory Visit: Payer: Self-pay

## 2024-01-01 ENCOUNTER — Encounter (HOSPITAL_COMMUNITY): Payer: Self-pay | Admitting: Surgery

## 2024-01-01 ENCOUNTER — Observation Stay (HOSPITAL_COMMUNITY)
Admission: EM | Admit: 2024-01-01 | Discharge: 2024-01-03 | Disposition: A | Discharge status: Home / Self Care | Attending: Surgery | Admitting: Surgery

## 2024-01-01 DIAGNOSIS — S2239XA Fracture of one rib, unspecified side, initial encounter for closed fracture: Secondary | ICD-10-CM

## 2024-01-01 DIAGNOSIS — I629 Nontraumatic intracranial hemorrhage, unspecified: Principal | ICD-10-CM

## 2024-01-01 DIAGNOSIS — S066XAA Traumatic subarachnoid hemorrhage with loss of consciousness status unknown, initial encounter: Secondary | ICD-10-CM | POA: Diagnosis not present

## 2024-01-01 DIAGNOSIS — E1165 Type 2 diabetes mellitus with hyperglycemia: Secondary | ICD-10-CM | POA: Insufficient documentation

## 2024-01-01 DIAGNOSIS — R079 Chest pain, unspecified: Secondary | ICD-10-CM | POA: Diagnosis present

## 2024-01-01 DIAGNOSIS — S2232XA Fracture of one rib, left side, initial encounter for closed fracture: Principal | ICD-10-CM | POA: Insufficient documentation

## 2024-01-01 DIAGNOSIS — S065XAA Traumatic subdural hemorrhage with loss of consciousness status unknown, initial encounter: Secondary | ICD-10-CM | POA: Diagnosis not present

## 2024-01-01 DIAGNOSIS — Z794 Long term (current) use of insulin: Secondary | ICD-10-CM | POA: Insufficient documentation

## 2024-01-01 DIAGNOSIS — N289 Disorder of kidney and ureter, unspecified: Secondary | ICD-10-CM | POA: Insufficient documentation

## 2024-01-01 LAB — CBC WITH DIFFERENTIAL/PLATELET
Abs Immature Granulocytes: 0.06 10*3/uL (ref 0.00–0.07)
Basophils Absolute: 0 10*3/uL (ref 0.0–0.1)
Basophils Relative: 0 %
Eosinophils Absolute: 0.1 10*3/uL (ref 0.0–0.5)
Eosinophils Relative: 2 %
HCT: 40.7 % (ref 36.0–46.0)
Hemoglobin: 14.3 g/dL (ref 12.0–15.0)
Immature Granulocytes: 1 %
Lymphocytes Relative: 16 %
Lymphs Abs: 1.4 10*3/uL (ref 0.7–4.0)
MCH: 26.6 pg (ref 26.0–34.0)
MCHC: 35.1 g/dL (ref 30.0–36.0)
MCV: 75.7 fL — ABNORMAL LOW (ref 80.0–100.0)
Monocytes Absolute: 0.5 10*3/uL (ref 0.1–1.0)
Monocytes Relative: 6 %
Neutro Abs: 6.4 10*3/uL (ref 1.7–7.7)
Neutrophils Relative %: 75 %
Platelets: 281 10*3/uL (ref 150–400)
RBC: 5.38 MIL/uL — ABNORMAL HIGH (ref 3.87–5.11)
RDW: 14.4 % (ref 11.5–15.5)
WBC: 8.5 10*3/uL (ref 4.0–10.5)
nRBC: 0 % (ref 0.0–0.2)

## 2024-01-01 LAB — COMPREHENSIVE METABOLIC PANEL WITH GFR
ALT: 43 U/L (ref 0–44)
AST: 70 U/L — ABNORMAL HIGH (ref 15–41)
Albumin: 3.5 g/dL (ref 3.5–5.0)
Alkaline Phosphatase: 101 U/L (ref 38–126)
Anion gap: 10 (ref 5–15)
BUN: 12 mg/dL (ref 6–20)
CO2: 22 mmol/L (ref 22–32)
Calcium: 9.2 mg/dL (ref 8.9–10.3)
Chloride: 102 mmol/L (ref 98–111)
Creatinine, Ser: 0.74 mg/dL (ref 0.44–1.00)
GFR, Estimated: >60 mL/min (ref >60)
Glucose, Bld: 434 mg/dL — ABNORMAL HIGH (ref 70–99)
Potassium: 3.6 mmol/L (ref 3.5–5.1)
Sodium: 134 mmol/L — ABNORMAL LOW (ref 135–145)
Total Bilirubin: 1.3 mg/dL — ABNORMAL HIGH (ref 0.0–1.2)
Total Protein: 7.2 g/dL (ref 6.5–8.1)

## 2024-01-01 LAB — I-STAT CHEM 8, ED
BUN: 13 mg/dL (ref 6–20)
Calcium, Ion: 1.13 mmol/L — ABNORMAL LOW (ref 1.15–1.40)
Chloride: 102 mmol/L (ref 98–111)
Creatinine, Ser: 0.7 mg/dL (ref 0.44–1.00)
Glucose, Bld: 441 mg/dL — ABNORMAL HIGH (ref 70–99)
HCT: 42 % (ref 36.0–46.0)
Hemoglobin: 14.3 g/dL (ref 12.0–15.0)
Potassium: 3.7 mmol/L (ref 3.5–5.1)
Sodium: 136 mmol/L (ref 135–145)
TCO2: 22 mmol/L (ref 22–32)

## 2024-01-01 LAB — CBG MONITORING, ED: Glucose-Capillary: 306 mg/dL — ABNORMAL HIGH (ref 70–99)

## 2024-01-01 MED ORDER — LEVETIRACETAM (KEPPRA) 500 MG/5 ML ADULT IV PUSH
500.0000 mg | Freq: Two times a day (BID) | INTRAVENOUS | Status: DC
  Administered 2024-01-01 – 2024-01-03 (×4): 500 mg via INTRAVENOUS
  Filled 2024-01-01 (×4): qty 5

## 2024-01-01 MED ORDER — ONDANSETRON 4 MG PO TBDP: 4.0000 mg | ORAL_TABLET | Freq: Four times a day (QID) | ORAL | Status: DC | PRN

## 2024-01-01 MED ORDER — INSULIN GLARGINE-YFGN 100 UNIT/ML ~~LOC~~ SOLN
25.0000 [IU] | Freq: Every day | SUBCUTANEOUS | Status: DC
  Administered 2024-01-01 – 2024-01-02 (×2): 25 [IU] via SUBCUTANEOUS
  Filled 2024-01-01 (×3): qty 0.25

## 2024-01-01 MED ORDER — HYDROMORPHONE HCL 1 MG/ML IJ SOLN
1.0000 mg | Freq: Once | INTRAMUSCULAR | Status: AC
  Administered 2024-01-01: 1 mg via INTRAVENOUS
  Filled 2024-01-01: qty 1

## 2024-01-01 MED ORDER — INSULIN ASPART 100 UNIT/ML IJ SOLN
0.0000 [IU] | Freq: Every day | INTRAMUSCULAR | Status: DC
  Administered 2024-01-01: 4 [IU] via SUBCUTANEOUS

## 2024-01-01 MED ORDER — POLYETHYLENE GLYCOL 3350 17 G PO PACK: 17.0000 g | PACK | Freq: Every day | ORAL | Status: DC | PRN

## 2024-01-01 MED ORDER — HYDROCODONE-ACETAMINOPHEN 5-325 MG PO TABS
1.0000 | ORAL_TABLET | ORAL | Status: DC | PRN
  Filled 2024-01-01: qty 1

## 2024-01-01 MED ORDER — IOHEXOL 350 MG/ML SOLN
75.0000 mL | Freq: Once | INTRAVENOUS | Status: AC | PRN
  Administered 2024-01-01: 75 mL via INTRAVENOUS

## 2024-01-01 MED ORDER — ONDANSETRON HCL 4 MG/2ML IJ SOLN: 4.0000 mg | Freq: Four times a day (QID) | INTRAMUSCULAR | Status: DC | PRN

## 2024-01-01 MED ORDER — MELATONIN 3 MG PO TABS: 3.0000 mg | ORAL_TABLET | Freq: Every evening | ORAL | Status: DC | PRN

## 2024-01-01 MED ORDER — SODIUM CHLORIDE 0.9 % IV BOLUS
1000.0000 mL | Freq: Once | INTRAVENOUS | Status: AC
  Administered 2024-01-01: 1000 mL via INTRAVENOUS

## 2024-01-01 MED ORDER — HYDRALAZINE HCL 20 MG/ML IJ SOLN: 10.0000 mg | INTRAMUSCULAR | Status: DC | PRN

## 2024-01-01 MED ORDER — INSULIN ASPART 100 UNIT/ML IJ SOLN
0.0000 [IU] | Freq: Three times a day (TID) | INTRAMUSCULAR | Status: DC
  Administered 2024-01-02: 11 [IU] via SUBCUTANEOUS
  Administered 2024-01-02 (×2): 7 [IU] via SUBCUTANEOUS
  Administered 2024-01-03 (×2): 4 [IU] via SUBCUTANEOUS

## 2024-01-01 MED ORDER — METOPROLOL TARTRATE 5 MG/5ML IV SOLN: 5.0000 mg | Freq: Four times a day (QID) | INTRAVENOUS | Status: DC | PRN

## 2024-01-01 MED ORDER — METHOCARBAMOL 500 MG PO TABS
500.0000 mg | ORAL_TABLET | Freq: Three times a day (TID) | ORAL | Status: DC
  Administered 2024-01-01 – 2024-01-03 (×5): 500 mg via ORAL
  Filled 2024-01-01 (×5): qty 1

## 2024-01-01 MED ORDER — HYDROMORPHONE HCL 1 MG/ML IJ SOLN
0.5000 mg | INTRAMUSCULAR | Status: DC | PRN
  Administered 2024-01-01 (×2): 1 mg via INTRAVENOUS
  Filled 2024-01-01 (×2): qty 1

## 2024-01-01 MED ORDER — METHOCARBAMOL 1000 MG/10ML IJ SOLN: 500.0000 mg | Freq: Three times a day (TID) | INTRAMUSCULAR | Status: DC

## 2024-01-01 MED ORDER — DOCUSATE SODIUM 100 MG PO CAPS
100.0000 mg | ORAL_CAPSULE | Freq: Two times a day (BID) | ORAL | Status: DC
  Administered 2024-01-01 – 2024-01-03 (×4): 100 mg via ORAL
  Filled 2024-01-01 (×4): qty 1

## 2024-01-01 NOTE — ED Notes (Signed)
 Patient transported to CT
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
## 2024-01-01 NOTE — ED Notes (Signed)
 Patient placed on bedpan with assistance from family, patient remains tearful and upset is redirectable and calm. Patient reports pain in chest when laughing but improvement over all.

## 2024-01-02 ENCOUNTER — Observation Stay (HOSPITAL_COMMUNITY)

## 2024-01-02 LAB — BASIC METABOLIC PANEL WITH GFR
Anion gap: 9 (ref 5–15)
BUN: 8 mg/dL (ref 6–20)
CO2: 25 mmol/L (ref 22–32)
Calcium: 9.1 mg/dL (ref 8.9–10.3)
Chloride: 104 mmol/L (ref 98–111)
Creatinine, Ser: 0.6 mg/dL (ref 0.44–1.00)
GFR, Estimated: >60 mL/min (ref >60)
Glucose, Bld: 236 mg/dL — ABNORMAL HIGH (ref 70–99)
Potassium: 3.6 mmol/L (ref 3.5–5.1)
Sodium: 138 mmol/L (ref 135–145)

## 2024-01-02 LAB — CBC
HCT: 39.7 % (ref 36.0–46.0)
Hemoglobin: 13.6 g/dL (ref 12.0–15.0)
MCH: 26.4 pg (ref 26.0–34.0)
MCHC: 34.3 g/dL (ref 30.0–36.0)
MCV: 77.1 fL — ABNORMAL LOW (ref 80.0–100.0)
Platelets: 264 10*3/uL (ref 150–400)
RBC: 5.15 MIL/uL — ABNORMAL HIGH (ref 3.87–5.11)
RDW: 14.6 % (ref 11.5–15.5)
WBC: 7.8 10*3/uL (ref 4.0–10.5)
nRBC: 0 % (ref 0.0–0.2)

## 2024-01-02 LAB — GLUCOSE, CAPILLARY
Glucose-Capillary: 298 mg/dL — ABNORMAL HIGH (ref 70–99)
Glucose-Capillary: 226 mg/dL — ABNORMAL HIGH (ref 70–99)
Glucose-Capillary: 180 mg/dL — ABNORMAL HIGH (ref 70–99)

## 2024-01-02 LAB — CBG MONITORING, ED
Glucose-Capillary: 216 mg/dL — ABNORMAL HIGH (ref 70–99)
Glucose-Capillary: 297 mg/dL — ABNORMAL HIGH (ref 70–99)

## 2024-01-02 LAB — HIV ANTIBODY (ROUTINE TESTING W REFLEX): HIV Screen 4th Generation wRfx: NONREACTIVE

## 2024-01-02 MED ORDER — TRAMADOL HCL 50 MG PO TABS
50.0000 mg | ORAL_TABLET | Freq: Four times a day (QID) | ORAL | Status: DC | PRN
  Administered 2024-01-02 (×2): 100 mg via ORAL
  Administered 2024-01-03: 50 mg via ORAL
  Filled 2024-01-02: qty 2
  Filled 2024-01-02: qty 1
  Filled 2024-01-02: qty 2

## 2024-01-02 MED ORDER — DIPHENHYDRAMINE HCL 25 MG PO CAPS
25.0000 mg | ORAL_CAPSULE | Freq: Four times a day (QID) | ORAL | Status: DC | PRN
  Administered 2024-01-03: 25 mg via ORAL
  Filled 2024-01-02: qty 1

## 2024-01-02 MED ORDER — MORPHINE SULFATE (PF) 2 MG/ML IV SOLN
2.0000 mg | INTRAVENOUS | Status: DC | PRN
  Administered 2024-01-02: 2 mg via INTRAVENOUS
  Filled 2024-01-02: qty 1

## 2024-01-02 MED ORDER — METFORMIN HCL ER 500 MG PO TB24
1000.0000 mg | ORAL_TABLET | Freq: Two times a day (BID) | ORAL | Status: DC
  Filled 2024-01-02: qty 2

## 2024-01-02 NOTE — Progress Notes (Signed)
 PT admitted to the unit from ED. PT is Ax0x4 . PT VSS and assessment completed. Pt is oriented to the unit. Pt belongings are at the bedside. Pt called son upon arrival to the unit. Call-bell within reach and bed in lowest position.   01/02/24 1419  Charting Type  Charting Type Admission/Transfer Assessment  [place] Work Intensity Score (Update with each assessment and as needed)  Work Intensity Score (Level) 3  Level 3 Intensity S.Neuro checks Q2 hrs  Neurological  Neuro (WDL) X  Orientation Level Oriented X4  Cognition Follows commands  Speech Clear  R Pupil Size (mm) 3  R Pupil Shape Round  R Pupil Reaction Brisk  L Pupil Size (mm) 3  L Pupil Shape Round  L Pupil Reaction Brisk  Motor Function/Sensation Assessment Grip;Motor response;Motor strength;Sensation  R Hand Grip Moderate  L Hand Grip Moderate  RUE Motor Response Purposeful movement  RUE Sensation Full sensation  RUE Motor Strength 5  LUE Motor Response Purposeful movement  LUE Sensation Full sensation  LUE Motor Strength 5  RLE Motor Response Purposeful movement  RLE Sensation Full sensation  RLE Motor Strength 5  LLE Motor Response Purposeful movement  LLE Sensation Full sensation  LLE Motor Strength 5  Glasgow Coma Scale  Eye Opening 4  Best Verbal Response (NON-intubated) 5  Best Motor Response 6  Glasgow Coma Scale Score 15  NuDESC - Delirium Risk Factor Assessment (Complete for non-ICU patients)  Delirium Risk Factor Assessment No risk factors identified - Delirium assessment complete  Oral Assessment (Complete on admission/transfer/every shift)  Oral Assessment  (WDL) X  Lips Symmetrical  Teeth Dentures upper;Dentures lower  Tongue Pink;Moist  Mucous Membrane(s) Moist;Pink  Saliva Moist, saliva free flowing  Is patient on any of following O2 devices? None of the above  Nutritional status No high risk factors  Oral Assessment Risk  Low Risk  HEENT  HEENT (WDL) X  Vision Check No  Voice Clear   Respiratory  Respiratory Pattern Regular;Unlabored  Chest Assessment Chest expansion symmetrical  Bilateral Breath Sounds Clear;Diminished  R Upper  Breath Sounds Clear  L Upper Breath Sounds Clear  R Lower Breath Sounds Diminished  L Lower Breath Sounds Diminished  Cough None  Respiratory (WDL) X  Respiratory Interventions Cough and deep breathe w/ teach back;Incentive spirometry w/ teach back  Cough and Deep Breathe  Cough and Deep Breathe Yes  Cardiac  Cardiac (WDL) WDL  ECG Monitor Yes  ECG Monitoring  ECG Heart Rate (!) 101  Antiarrhythmic device  Antiarrhythmic device No  Vascular  Vascular (WDL) X  Capillary Refill Less than 3 seconds  Pulses R radial;L radial;R dorsalis pedis;L dorsalis pedis  RUE Neurovascular Assessment  R Radial Pulse +2  LUE Neurovascular Assessment  L Radial Pulse +2  RLE Neurovascular Assessment  R Dorsalis Pedis Pulse +2  LLE Neurovascular Assessment  L Dorsalis Pedis Pulse +2  Musculoskeletal  Musculoskeletal (WDL) X  Assistive Device None  Generalized Weakness Yes  Weight Bearing Restrictions Per Provider Order No  Gastrointestinal  Gastrointestinal (WDL) X  Abdomen Inspection Soft  Bowel Sounds Assessment Active  Tenderness Tender  Last BM Date   (pta)  Passing Flatus Yes  GU Assessment  Genitourinary (WDL) WDL  Genitalia  Female Genitalia Intact  Psychosocial  Psychosocial (WDL) WDL  Integumentary  Integumentary (WDL) X  Nurse Assisting with Skin Assessment on Admission Kelly Troung  Skin Color Appropriate for ethnicity  Skin Condition Dry  Skin Integrity Ecchymosis  Ecchymosis  Location Leg  Ecchymosis Location Orientation Right  Skin Turgor Non-tenting  Mobility Assessment: RN to perform for Med/Surg, Progressive Care, Stepdown, & Geropsych at admission/transfer/every shift/prn  Does patient have an order for bedrest or is patient medically unstable No - Continue assessment  What is the highest level of mobility  based on the progressive mobility assessment? Level 4 (Walks with assist in room) - Balance while marching in place and cannot step forward and back - Complete  Braden Scale (Ages 8 and up)  Sensory Perceptions 3  Moisture 4  Activity 3  Mobility 3  Nutrition 3  Friction and Shear 3  Braden Scale Score 19  Neurological  Level of Consciousness Alert
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
## 2024-01-02 NOTE — Progress Notes (Signed)
 PT Cancellation Note  Patient Details Name: Kristy [name] MRN: [id number] DOB: 11-06-1965   Cancelled Treatment:    Reason Eval/Treat Not Completed: Patient at procedure or test/unavailable, upon my arrival to ED, pt being transferred up to floor. Will follow and attempt evaluation once admitted to new unit.   Barnabas Booth, PT, DPT   Acute Rehabilitation Department Office [phone] Secure Chat Communication Preferred    Lona Rist 01/02/2024, 1:50 PM

## 2024-01-03 ENCOUNTER — Other Ambulatory Visit (HOSPITAL_COMMUNITY): Payer: Self-pay

## 2024-01-03 LAB — HEMOGLOBIN A1C
Hgb A1c MFr Bld: 15.5 % — ABNORMAL HIGH (ref 4.8–5.6)
Mean Plasma Glucose: 398 mg/dL

## 2024-01-03 LAB — GLUCOSE, CAPILLARY
Glucose-Capillary: 168 mg/dL — ABNORMAL HIGH (ref 70–99)
Glucose-Capillary: 173 mg/dL — ABNORMAL HIGH (ref 70–99)

## 2024-01-03 MED ORDER — ACETAMINOPHEN 500 MG PO TABS: 1000.0000 mg | ORAL_TABLET | Freq: Four times a day (QID) | ORAL | Status: AC | PRN

## 2024-01-03 MED ORDER — METHOCARBAMOL 500 MG PO TABS
1000.0000 mg | ORAL_TABLET | Freq: Three times a day (TID) | ORAL | Status: DC
  Administered 2024-01-03: 1000 mg via ORAL
  Filled 2024-01-03: qty 2

## 2024-01-03 MED ORDER — METHOCARBAMOL 500 MG PO TABS
1000.0000 mg | ORAL_TABLET | Freq: Three times a day (TID) | ORAL | 0 refills | Status: DC | PRN
  Filled 2024-01-03: qty 60, 10d supply, fill #0

## 2024-01-03 MED ORDER — POLYETHYLENE GLYCOL 3350 17 G PO PACK
17.0000 g | PACK | Freq: Two times a day (BID) | ORAL | Status: DC
  Administered 2024-01-03: 17 g via ORAL
  Filled 2024-01-03: qty 1

## 2024-01-03 MED ORDER — TRAMADOL HCL 50 MG PO TABS
50.0000 mg | ORAL_TABLET | Freq: Four times a day (QID) | ORAL | 0 refills | Status: AC | PRN
  Filled 2024-01-03: qty 20, 5d supply, fill #0

## 2024-01-03 MED ORDER — DOCUSATE SODIUM 100 MG PO CAPS
100.0000 mg | ORAL_CAPSULE | Freq: Two times a day (BID) | ORAL | 0 refills | Status: AC
  Filled 2024-01-03: qty 10, 5d supply, fill #0

## 2024-01-03 MED ORDER — LEVETIRACETAM 500 MG PO TABS
500.0000 mg | ORAL_TABLET | Freq: Two times a day (BID) | ORAL | 0 refills | Status: AC
  Filled 2024-01-03: qty 10, 5d supply, fill #0

## 2024-01-03 MED ORDER — ACETAMINOPHEN 500 MG PO TABS
1000.0000 mg | ORAL_TABLET | Freq: Four times a day (QID) | ORAL | Status: DC
  Administered 2024-01-03: 1000 mg via ORAL
  Filled 2024-01-03: qty 2

## 2024-01-03 MED ORDER — LEVETIRACETAM 500 MG PO TABS: 500.0000 mg | ORAL_TABLET | Freq: Two times a day (BID) | ORAL | Status: DC
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
## 2024-01-03 NOTE — Plan of Care (Signed)
   Problem: Nutritional: Goal: Maintenance of adequate nutrition will improve Outcome: Progressing   Problem: Skin Integrity: Goal: Risk for impaired skin integrity will decrease Outcome: Progressing
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
## 2024-01-03 NOTE — Progress Notes (Signed)
 Nursing Discharge Note   Name: Kristy [name] MRN: [id number] DOB: October 09, 1965    Admit Date:  01/01/2024  Discharge Date:  01/03/2024   Kristy [name] is to be discharged home per MD order.  AVS completed. Reviewed with patient and family at bedside. Highlighted copy provided for patient to take home.  Patient/caregiver able to verbalize understanding of discharge instructions. PIV removed. Patient stable upon discharge.   Patient with TOC medications, to be retrieved prior to discharge.  Patient sent home with DME of BSC and Rolling Walker.  Family to provide transport home.   Discharge Instructions   None     Allergies as of 01/03/2024       Reactions   Other Anaphylaxis, Hives   hives   Fish Allergy Hives   hives   Oxycodone Itching, Dermatitis        Medication List     STOP taking these medications    apixaban 2.5 MG Tabs tablet Commonly known as: ELIQUIS   doxycycline  100 MG capsule Commonly known as: VIBRAMYCIN    meloxicam  15 MG tablet Commonly known as: MOBIC        TAKE these medications    acetaminophen  500 MG tablet Commonly known as: TYLENOL  Take 2 tablets (1,000 mg total) by mouth every 6 (six) hours as needed. What changed:  when to take this reasons to take this   atorvastatin  80 MG tablet Commonly known as: LIPITOR Take 1 tablet by mouth daily.   docusate sodium 100 MG capsule Commonly known as: COLACE Take 1 capsule (100 mg total) by mouth 2 (two) times daily.   glucose blood test strip Commonly known as: AgaMatrix Presto Test Use as instructed   insulin  lispro 100 UNIT/ML KiwkPen Commonly known as: HUMALOG  Inject 0.1 mLs (10 Units total) into the skin 2 (two) times daily. What changed: how much to take   Insulin  Pen Needle 29G X 12.7MM Misc Commonly known as: BD ULTRA-FINE PEN NEEDLES Check blood sugar 4x daily   Lancets Misc Check BG prior to administering insulin    Lantus  SoloStar 100 UNIT/ML Solostar Pen Generic drug:  insulin  glargine Inject 31 Units into the skin 2 (two) times daily. What changed:  how much to take when to take this   levETIRAcetam 500 MG tablet Commonly known as: KEPPRA Take 1 tablet (500 mg total) by mouth 2 (two) times daily for 5 days.   metFORMIN  1000 MG tablet Commonly known as: GLUCOPHAGE  Take 1 tablet by mouth 2 (two) times daily.   metFORMIN  500 MG 24 hr tablet Commonly known as: GLUCOPHAGE -XR Take 1,000 mg by mouth 2 (two) times daily.   methocarbamol  500 MG tablet Commonly known as: ROBAXIN  Take 2 tablets (1,000 mg total) by mouth every 8 (eight) hours as needed for muscle spasms.   Ozempic (1 MG/DOSE) 4 MG/3ML Sopn Generic drug: Semaglutide (1 MG/DOSE) Inject 1 mg as directed once a week.   traMADol  50 MG tablet Commonly known as: ULTRAM  Take 1 tablet (50 mg total) by mouth every 6 (six) hours as needed (severe pain).               Durable Medical Equipment  (From admission, onward)           Start     Ordered   01/03/24 1235  For home use only DME Walker rolling  Once       Comments: To help patient transfer and ambulate.  Physical / Occupational Therapy may change type of walker PRN.  Question Answer Comment  Walker: With 5 Inch Wheels   Patient needs a walker to treat with the following condition TBI (traumatic brain injury) (HCC)      01/03/24 1234   01/03/24 1234  For home use only DME 3 n 1  Once        01/03/24 1233             Discharge Instructions/ Education: An After Visit Summary was printed and given to the patient. Discharge instructions given to patient/family with verbalized understanding. Discharge education completed with patient/family including: follow up instructions, medication list, discharge activities, and limitations if indicated.  Additional discharge instructions as indicated by discharging provider also reviewed.  Patient and family able to verbalize understanding, all questions fully answered. Patient  instructed to return to Emergency Department, call 911, or call MD for any changes in condition.   Patient escorted via wheelchair to lobby and discharged home via private automobile.
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
## 2024-01-03 NOTE — Progress Notes (Signed)
 Transition of Care [facility]) - CAGE-AID Screening   Patient Details  Name: Kristy [name] MRN: [id number] Date of Birth: May 05, 1966  Transition [facility]) CM/SW Contact:    Brunetta Capes, RN Phone Number: 01/03/2024, 6:41 AM   Clinical Narrative:  Patient endorses some use of alcohol, denies illicit drugs. Resources not given at this time.  CAGE-AID Screening:    Have You Ever Felt You Ought to Cut Down on Your Drinking or Drug Use?: No Have People Annoyed You By Critizing Your Drinking Or Drug Use?: No Have You Felt Bad Or Guilty About Your Drinking Or Drug Use?: No Have You Ever Had a Drink or Used Drugs First Thing In The Morning to Steady Your Nerves or to Get Rid of a Hangover?: No CAGE-AID Score: 0  Substance Abuse Education Offered: No

## 2024-01-05 ENCOUNTER — Emergency Department (HOSPITAL_COMMUNITY)
Admission: EM | Admit: 2024-01-05 | Discharge: 2024-01-05 | Disposition: A | Discharge status: Home / Self Care | Attending: Emergency Medicine | Admitting: Emergency Medicine

## 2024-01-05 ENCOUNTER — Other Ambulatory Visit: Payer: Self-pay

## 2024-01-05 ENCOUNTER — Encounter (HOSPITAL_COMMUNITY): Payer: Self-pay

## 2024-01-05 ENCOUNTER — Emergency Department (HOSPITAL_COMMUNITY)

## 2024-01-05 DIAGNOSIS — R739 Hyperglycemia, unspecified: Secondary | ICD-10-CM | POA: Insufficient documentation

## 2024-01-05 DIAGNOSIS — R112 Nausea with vomiting, unspecified: Secondary | ICD-10-CM | POA: Diagnosis not present

## 2024-01-05 DIAGNOSIS — E876 Hypokalemia: Secondary | ICD-10-CM | POA: Insufficient documentation

## 2024-01-05 LAB — COMPREHENSIVE METABOLIC PANEL WITH GFR
ALT: 21 U/L (ref 0–44)
AST: 15 U/L (ref 15–41)
Albumin: 3.3 g/dL — ABNORMAL LOW (ref 3.5–5.0)
Alkaline Phosphatase: 90 U/L (ref 38–126)
Anion gap: 8 (ref 5–15)
BUN: 8 mg/dL (ref 6–20)
CO2: 26 mmol/L (ref 22–32)
Calcium: 9.2 mg/dL (ref 8.9–10.3)
Chloride: 99 mmol/L (ref 98–111)
Creatinine, Ser: 0.77 mg/dL (ref 0.44–1.00)
GFR, Estimated: >60 mL/min (ref >60)
Glucose, Bld: 302 mg/dL — ABNORMAL HIGH (ref 70–99)
Potassium: 3.3 mmol/L — ABNORMAL LOW (ref 3.5–5.1)
Sodium: 133 mmol/L — ABNORMAL LOW (ref 135–145)
Total Bilirubin: 1.1 mg/dL (ref 0.0–1.2)
Total Protein: 7.4 g/dL (ref 6.5–8.1)

## 2024-01-05 LAB — CBC
HCT: 41.2 % (ref 36.0–46.0)
Hemoglobin: 14.1 g/dL (ref 12.0–15.0)
MCH: 26.9 pg (ref 26.0–34.0)
MCHC: 34.2 g/dL (ref 30.0–36.0)
MCV: 78.6 fL — ABNORMAL LOW (ref 80.0–100.0)
Platelets: 283 10*3/uL (ref 150–400)
RBC: 5.24 MIL/uL — ABNORMAL HIGH (ref 3.87–5.11)
RDW: 14.5 % (ref 11.5–15.5)
WBC: 5.6 10*3/uL (ref 4.0–10.5)
nRBC: 0 % (ref 0.0–0.2)

## 2024-01-05 MED ORDER — LIDOCAINE 5 % EX PTCH
1.0000 | MEDICATED_PATCH | CUTANEOUS | Status: DC
  Administered 2024-01-05: 1 via TRANSDERMAL
  Filled 2024-01-05: qty 1

## 2024-01-05 MED ORDER — INSULIN ASPART 100 UNIT/ML IJ SOLN
10.0000 [IU] | Freq: Once | INTRAMUSCULAR | Status: AC
  Administered 2024-01-05: 10 [IU] via SUBCUTANEOUS
  Filled 2024-01-05: qty 0.1

## 2024-01-05 MED ORDER — POTASSIUM CHLORIDE 20 MEQ PO PACK
40.0000 meq | PACK | Freq: Once | ORAL | Status: AC
  Administered 2024-01-05: 40 meq via ORAL
  Filled 2024-01-05: qty 2

## 2024-01-05 MED ORDER — ONDANSETRON HCL 4 MG/2ML IJ SOLN
4.0000 mg | Freq: Once | INTRAMUSCULAR | Status: AC
  Administered 2024-01-05: 4 mg via INTRAVENOUS
  Filled 2024-01-05: qty 2

## 2024-01-05 MED ORDER — LACTATED RINGERS IV BOLUS
1000.0000 mL | Freq: Once | INTRAVENOUS | Status: AC
  Administered 2024-01-05: 1000 mL via INTRAVENOUS

## 2024-01-05 MED ORDER — MORPHINE SULFATE (PF) 4 MG/ML IV SOLN
4.0000 mg | Freq: Once | INTRAVENOUS | Status: AC
  Administered 2024-01-05: 4 mg via INTRAVENOUS
  Filled 2024-01-05: qty 1

## 2024-01-05 MED ORDER — ONDANSETRON HCL 4 MG PO TABS: 4.0000 mg | ORAL_TABLET | Freq: Four times a day (QID) | ORAL | 0 refills | Status: AC

## 2024-01-05 NOTE — Discharge Instructions (Addendum)
 You were seen for nausea and vomiting.  Thankfully your workup has been reassuring and repeat imaging of your head did not show any progression of your previous bleed.  Your symptoms are most likely caused by your recent concussion.  I have sent in a medication called Ondansetron  (Zofran ) to your pharmacy which will help your symptoms.   Please also focus on healthy eating habits and compliance with your insulin  as your blood sugar has been continually elevated and your A1c shows that your blood sugar is very uncontrolled.  I have provided information regarding both postconcussive syndrome related to your recent head injury and information regarding diabetes management.  Please follow-up with your primary care doctor regarding this ED visit and for further recommendations ASAP.  Please return to the ED if you develop worsening headaches, visual changes, and/or weakness.
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
## 2024-01-14 ENCOUNTER — Inpatient Hospital Stay (HOSPITAL_COMMUNITY)
Admission: EM | Admit: 2024-01-14 | Discharge: 2024-01-17 | DRG: 176 | Disposition: A | Discharge status: Home Health | Attending: Family Medicine | Admitting: Family Medicine

## 2024-01-14 ENCOUNTER — Encounter (HOSPITAL_COMMUNITY): Payer: Self-pay

## 2024-01-14 ENCOUNTER — Other Ambulatory Visit: Payer: Self-pay

## 2024-01-14 DIAGNOSIS — F17210 Nicotine dependence, cigarettes, uncomplicated: Secondary | ICD-10-CM | POA: Diagnosis present

## 2024-01-14 DIAGNOSIS — Z888 Allergy status to other drugs, medicaments and biological substances status: Secondary | ICD-10-CM

## 2024-01-14 DIAGNOSIS — M5432 Sciatica, left side: Secondary | ICD-10-CM | POA: Diagnosis present

## 2024-01-14 DIAGNOSIS — I2693 Single subsegmental pulmonary embolism without acute cor pulmonale: Principal | ICD-10-CM | POA: Diagnosis present

## 2024-01-14 DIAGNOSIS — G8929 Other chronic pain: Secondary | ICD-10-CM | POA: Diagnosis present

## 2024-01-14 DIAGNOSIS — Z7401 Bed confinement status: Secondary | ICD-10-CM

## 2024-01-14 DIAGNOSIS — Z79899 Other long term (current) drug therapy: Secondary | ICD-10-CM

## 2024-01-14 DIAGNOSIS — Z91018 Allergy to other foods: Secondary | ICD-10-CM

## 2024-01-14 DIAGNOSIS — E1165 Type 2 diabetes mellitus with hyperglycemia: Secondary | ICD-10-CM | POA: Diagnosis present

## 2024-01-14 DIAGNOSIS — Z7984 Long term (current) use of oral hypoglycemic drugs: Secondary | ICD-10-CM

## 2024-01-14 DIAGNOSIS — Z7901 Long term (current) use of anticoagulants: Secondary | ICD-10-CM

## 2024-01-14 DIAGNOSIS — N2889 Other specified disorders of kidney and ureter: Secondary | ICD-10-CM

## 2024-01-14 DIAGNOSIS — Z794 Long term (current) use of insulin: Secondary | ICD-10-CM

## 2024-01-14 DIAGNOSIS — Z8673 Personal history of transient ischemic attack (TIA), and cerebral infarction without residual deficits: Secondary | ICD-10-CM

## 2024-01-14 DIAGNOSIS — C641 Malignant neoplasm of right kidney, except renal pelvis: Secondary | ICD-10-CM | POA: Diagnosis present

## 2024-01-14 DIAGNOSIS — S2232XD Fracture of one rib, left side, subsequent encounter for fracture with routine healing: Secondary | ICD-10-CM

## 2024-01-14 DIAGNOSIS — I2699 Other pulmonary embolism without acute cor pulmonale: Principal | ICD-10-CM | POA: Diagnosis present

## 2024-01-14 DIAGNOSIS — Z885 Allergy status to narcotic agent status: Secondary | ICD-10-CM

## 2024-01-14 DIAGNOSIS — Z8542 Personal history of malignant neoplasm of other parts of uterus: Secondary | ICD-10-CM

## 2024-01-14 DIAGNOSIS — E785 Hyperlipidemia, unspecified: Secondary | ICD-10-CM | POA: Diagnosis present
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
## 2024-01-15 ENCOUNTER — Emergency Department (HOSPITAL_COMMUNITY)

## 2024-01-15 ENCOUNTER — Inpatient Hospital Stay (HOSPITAL_COMMUNITY)

## 2024-01-15 DIAGNOSIS — Z7901 Long term (current) use of anticoagulants: Secondary | ICD-10-CM | POA: Diagnosis not present

## 2024-01-15 DIAGNOSIS — Z885 Allergy status to narcotic agent status: Secondary | ICD-10-CM | POA: Diagnosis not present

## 2024-01-15 DIAGNOSIS — N2889 Other specified disorders of kidney and ureter: Secondary | ICD-10-CM

## 2024-01-15 DIAGNOSIS — Z91018 Allergy to other foods: Secondary | ICD-10-CM | POA: Diagnosis not present

## 2024-01-15 DIAGNOSIS — Z888 Allergy status to other drugs, medicaments and biological substances status: Secondary | ICD-10-CM | POA: Diagnosis not present

## 2024-01-15 DIAGNOSIS — C641 Malignant neoplasm of right kidney, except renal pelvis: Secondary | ICD-10-CM | POA: Diagnosis present

## 2024-01-15 DIAGNOSIS — Z7984 Long term (current) use of oral hypoglycemic drugs: Secondary | ICD-10-CM | POA: Diagnosis not present

## 2024-01-15 DIAGNOSIS — Z7401 Bed confinement status: Secondary | ICD-10-CM | POA: Diagnosis not present

## 2024-01-15 DIAGNOSIS — I2609 Other pulmonary embolism with acute cor pulmonale: Secondary | ICD-10-CM | POA: Diagnosis not present

## 2024-01-15 DIAGNOSIS — E785 Hyperlipidemia, unspecified: Secondary | ICD-10-CM | POA: Diagnosis present

## 2024-01-15 DIAGNOSIS — S2232XD Fracture of one rib, left side, subsequent encounter for fracture with routine healing: Secondary | ICD-10-CM | POA: Diagnosis not present

## 2024-01-15 DIAGNOSIS — Z79899 Other long term (current) drug therapy: Secondary | ICD-10-CM | POA: Diagnosis not present

## 2024-01-15 DIAGNOSIS — E1165 Type 2 diabetes mellitus with hyperglycemia: Secondary | ICD-10-CM | POA: Diagnosis present

## 2024-01-15 DIAGNOSIS — G8929 Other chronic pain: Secondary | ICD-10-CM | POA: Diagnosis present

## 2024-01-15 DIAGNOSIS — Z8673 Personal history of transient ischemic attack (TIA), and cerebral infarction without residual deficits: Secondary | ICD-10-CM | POA: Diagnosis not present

## 2024-01-15 DIAGNOSIS — Z8542 Personal history of malignant neoplasm of other parts of uterus: Secondary | ICD-10-CM | POA: Diagnosis not present

## 2024-01-15 DIAGNOSIS — Z794 Long term (current) use of insulin: Secondary | ICD-10-CM | POA: Diagnosis not present

## 2024-01-15 DIAGNOSIS — M5432 Sciatica, left side: Secondary | ICD-10-CM | POA: Diagnosis present

## 2024-01-15 DIAGNOSIS — I2699 Other pulmonary embolism without acute cor pulmonale: Secondary | ICD-10-CM | POA: Diagnosis present

## 2024-01-15 DIAGNOSIS — I2693 Single subsegmental pulmonary embolism without acute cor pulmonale: Secondary | ICD-10-CM | POA: Diagnosis present

## 2024-01-15 DIAGNOSIS — F17210 Nicotine dependence, cigarettes, uncomplicated: Secondary | ICD-10-CM | POA: Diagnosis present

## 2024-01-15 LAB — TROPONIN I (HIGH SENSITIVITY)
Troponin I (High Sensitivity): <2 ng/L (ref <18)
Troponin I (High Sensitivity): <2 ng/L (ref <18)

## 2024-01-15 LAB — GLUCOSE, CAPILLARY
Glucose-Capillary: 229 mg/dL — ABNORMAL HIGH (ref 70–99)
Glucose-Capillary: 261 mg/dL — ABNORMAL HIGH (ref 70–99)
Glucose-Capillary: 125 mg/dL — ABNORMAL HIGH (ref 70–99)
Glucose-Capillary: 342 mg/dL — ABNORMAL HIGH (ref 70–99)

## 2024-01-15 LAB — BASIC METABOLIC PANEL WITH GFR
Anion gap: 12 (ref 5–15)
BUN: 10 mg/dL (ref 6–20)
CO2: 21 mmol/L — ABNORMAL LOW (ref 22–32)
Calcium: 9.4 mg/dL (ref 8.9–10.3)
Chloride: 101 mmol/L (ref 98–111)
Creatinine, Ser: 0.54 mg/dL (ref 0.44–1.00)
GFR, Estimated: >60 mL/min (ref >60)
Glucose, Bld: 238 mg/dL — ABNORMAL HIGH (ref 70–99)
Potassium: 4.2 mmol/L (ref 3.5–5.1)
Sodium: 134 mmol/L — ABNORMAL LOW (ref 135–145)

## 2024-01-15 LAB — CBC
HCT: 39.9 % (ref 36.0–46.0)
Hemoglobin: 13.9 g/dL (ref 12.0–15.0)
MCH: 26.4 pg (ref 26.0–34.0)
MCHC: 34.8 g/dL (ref 30.0–36.0)
MCV: 75.9 fL — ABNORMAL LOW (ref 80.0–100.0)
Platelets: 334 10*3/uL (ref 150–400)
RBC: 5.26 MIL/uL — ABNORMAL HIGH (ref 3.87–5.11)
RDW: 14.3 % (ref 11.5–15.5)
WBC: 8.5 10*3/uL (ref 4.0–10.5)
nRBC: 0 % (ref 0.0–0.2)

## 2024-01-15 LAB — HEPARIN LEVEL (UNFRACTIONATED)
Heparin Unfractionated: 0.17 [IU]/mL — ABNORMAL LOW (ref 0.30–0.70)
Heparin Unfractionated: 0.27 [IU]/mL — ABNORMAL LOW (ref 0.30–0.70)
Heparin Unfractionated: 0.29 [IU]/mL — ABNORMAL LOW (ref 0.30–0.70)

## 2024-01-15 LAB — MRSA NEXT GEN BY PCR, NASAL: MRSA by PCR Next Gen: DETECTED — AB

## 2024-01-15 MED ORDER — ONDANSETRON HCL 4 MG/2ML IJ SOLN
4.0000 mg | Freq: Four times a day (QID) | INTRAMUSCULAR | Status: DC | PRN
Start: 2024-01-15 — End: 2024-01-17

## 2024-01-15 MED ORDER — HYDROCODONE-ACETAMINOPHEN 5-325 MG PO TABS
1.0000 | ORAL_TABLET | Freq: Once | ORAL | Status: AC
  Administered 2024-01-15: 1 via ORAL
  Filled 2024-01-15: qty 1

## 2024-01-15 MED ORDER — ORAL CARE MOUTH RINSE
15.0000 mL | OROMUCOSAL | Status: DC | PRN
Start: 2024-01-15 — End: 2024-01-17

## 2024-01-15 MED ORDER — LORAZEPAM 1 MG PO TABS
1.0000 mg | ORAL_TABLET | ORAL | Status: DC | PRN
  Administered 2024-01-15: 1 mg via ORAL
  Filled 2024-01-15: qty 1

## 2024-01-15 MED ORDER — GADOBUTROL 1 MMOL/ML IV SOLN
7.3000 mL | Freq: Once | INTRAVENOUS | Status: AC | PRN
  Administered 2024-01-15: 7.3 mL via INTRAVENOUS

## 2024-01-15 MED ORDER — SODIUM CHLORIDE 0.9 % IV SOLN: INTRAVENOUS | Status: AC | PRN

## 2024-01-15 MED ORDER — INSULIN GLARGINE-YFGN 100 UNIT/ML ~~LOC~~ SOLN
25.0000 [IU] | Freq: Every day | SUBCUTANEOUS | Status: DC
  Administered 2024-01-15 – 2024-01-17 (×3): 25 [IU] via SUBCUTANEOUS
  Filled 2024-01-15 (×3): qty 0.25

## 2024-01-15 MED ORDER — IOHEXOL 350 MG/ML SOLN
75.0000 mL | Freq: Once | INTRAVENOUS | Status: AC | PRN
  Administered 2024-01-15: 75 mL via INTRAVENOUS

## 2024-01-15 MED ORDER — ACETAMINOPHEN 325 MG PO TABS: 650.0000 mg | ORAL_TABLET | Freq: Four times a day (QID) | ORAL | Status: DC | PRN

## 2024-01-15 MED ORDER — SENNOSIDES-DOCUSATE SODIUM 8.6-50 MG PO TABS: 1.0000 | ORAL_TABLET | Freq: Every evening | ORAL | Status: DC | PRN

## 2024-01-15 MED ORDER — ACETAMINOPHEN 325 MG PO TABS
650.0000 mg | ORAL_TABLET | Freq: Four times a day (QID) | ORAL | Status: DC | PRN
Start: 2024-01-15 — End: 2024-01-17
  Administered 2024-01-15 – 2024-01-17 (×3): 650 mg via ORAL
  Filled 2024-01-15 (×3): qty 2

## 2024-01-15 MED ORDER — LIDOCAINE 5 % EX PTCH
1.0000 | MEDICATED_PATCH | Freq: Every day | CUTANEOUS | Status: DC
  Administered 2024-01-15 – 2024-01-17 (×3): 1 via TRANSDERMAL
  Filled 2024-01-15 (×3): qty 1

## 2024-01-15 MED ORDER — METFORMIN HCL ER 500 MG PO TB24: 1000.0000 mg | ORAL_TABLET | Freq: Two times a day (BID) | ORAL | Status: DC

## 2024-01-15 MED ORDER — ONDANSETRON HCL 4 MG PO TABS: 4.0000 mg | ORAL_TABLET | Freq: Four times a day (QID) | ORAL | Status: DC | PRN

## 2024-01-15 MED ORDER — HEPARIN (PORCINE) 25000 UT/250ML-% IV SOLN
1100.0000 [IU]/h | INTRAVENOUS | Status: DC
  Filled 2024-01-15: qty 250

## 2024-01-15 MED ORDER — ENSURE PLUS HIGH PROTEIN PO LIQD
237.0000 mL | Freq: Two times a day (BID) | ORAL | Status: DC
  Administered 2024-01-15 – 2024-01-17 (×5): 237 mL via ORAL

## 2024-01-15 MED ORDER — CHLORHEXIDINE GLUCONATE CLOTH 2 % EX PADS
6.0000 | MEDICATED_PAD | Freq: Every day | CUTANEOUS | Status: DC
  Administered 2024-01-15 – 2024-01-17 (×3): 6 via TOPICAL

## 2024-01-15 MED ORDER — INSULIN ASPART 100 UNIT/ML IJ SOLN
0.0000 [IU] | Freq: Three times a day (TID) | INTRAMUSCULAR | Status: DC
  Administered 2024-01-15: 5 [IU] via SUBCUTANEOUS
  Administered 2024-01-15: 2 [IU] via SUBCUTANEOUS
  Administered 2024-01-15: 8 [IU] via SUBCUTANEOUS
  Administered 2024-01-16 (×2): 5 [IU] via SUBCUTANEOUS
  Administered 2024-01-16: 11 [IU] via SUBCUTANEOUS
  Administered 2024-01-17: 8 [IU] via SUBCUTANEOUS
  Filled 2024-01-15: qty 0.15

## 2024-01-15 MED ORDER — INSULIN ASPART 100 UNIT/ML IJ SOLN
0.0000 [IU] | Freq: Every day | INTRAMUSCULAR | Status: DC
  Administered 2024-01-15: 4 [IU] via SUBCUTANEOUS
  Administered 2024-01-16: 3 [IU] via SUBCUTANEOUS
  Filled 2024-01-15: qty 0.05

## 2024-01-15 MED ORDER — ACETAMINOPHEN 650 MG RE SUPP: 650.0000 mg | Freq: Four times a day (QID) | RECTAL | Status: DC | PRN

## 2024-01-15 MED ORDER — ONDANSETRON HCL 4 MG/2ML IJ SOLN: 4.0000 mg | Freq: Three times a day (TID) | INTRAMUSCULAR | Status: DC | PRN

## 2024-01-15 MED ORDER — TRAMADOL HCL 50 MG PO TABS
50.0000 mg | ORAL_TABLET | Freq: Four times a day (QID) | ORAL | Status: DC | PRN
  Administered 2024-01-15 – 2024-01-17 (×7): 50 mg via ORAL
  Filled 2024-01-15 (×7): qty 1

## 2024-01-15 MED ORDER — INSULIN ASPART 100 UNIT/ML IJ SOLN
12.0000 [IU] | Freq: Two times a day (BID) | INTRAMUSCULAR | Status: DC
  Administered 2024-01-15 – 2024-01-16 (×2): 12 [IU] via SUBCUTANEOUS

## 2024-01-15 MED ORDER — HEPARIN BOLUS VIA INFUSION
2000.0000 [IU] | Freq: Once | INTRAVENOUS | Status: DC
  Filled 2024-01-15: qty 2000

## 2024-01-15 MED ORDER — HEPARIN (PORCINE) 25000 UT/250ML-% IV SOLN
1350.0000 [IU]/h | INTRAVENOUS | Status: DC
  Administered 2024-01-15: 1100 [IU]/h via INTRAVENOUS
  Administered 2024-01-16: 1350 [IU]/h via INTRAVENOUS
  Filled 2024-01-15: qty 250

## 2024-01-15 MED ORDER — MUPIROCIN 2 % EX OINT
1.0000 | TOPICAL_OINTMENT | Freq: Two times a day (BID) | CUTANEOUS | Status: DC
  Administered 2024-01-15 – 2024-01-17 (×4): 1 via NASAL
  Filled 2024-01-15: qty 22

## 2024-01-15 MED ORDER — ONDANSETRON HCL 4 MG PO TABS: 4.0000 mg | ORAL_TABLET | Freq: Three times a day (TID) | ORAL | Status: DC | PRN
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
## 2024-01-15 NOTE — Progress Notes (Signed)
  Echocardiogram 2D Echocardiogram has been performed.  Tinnie FORBES Gosling RDCS 01/15/2024, 1:51 PM
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
## 2024-01-15 NOTE — Progress Notes (Signed)
   01/15/24 1537  TOC Brief Assessment  Insurance and Status Reviewed  Patient has primary care physician Yes  Home environment has been reviewed single family home  Prior level of function: independent  Prior/Current Home Services No current home services  Social Drivers of Health Review SDOH reviewed no interventions necessary  Readmission risk has been reviewed Yes  Transition of care needs transition of care needs identified, TOC will continue to follow    Heather Saltness, MSW, LCSW 01/15/2024 3:38 PM
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
## 2024-01-16 DIAGNOSIS — I2699 Other pulmonary embolism without acute cor pulmonale: Secondary | ICD-10-CM | POA: Diagnosis not present

## 2024-01-16 DIAGNOSIS — M5432 Sciatica, left side: Secondary | ICD-10-CM | POA: Diagnosis not present

## 2024-01-16 DIAGNOSIS — N2889 Other specified disorders of kidney and ureter: Secondary | ICD-10-CM | POA: Diagnosis not present

## 2024-01-16 DIAGNOSIS — E1165 Type 2 diabetes mellitus with hyperglycemia: Secondary | ICD-10-CM | POA: Diagnosis not present

## 2024-01-16 LAB — BASIC METABOLIC PANEL WITH GFR
Anion gap: 10 (ref 5–15)
BUN: 16 mg/dL (ref 6–20)
CO2: 24 mmol/L (ref 22–32)
Calcium: 9.3 mg/dL (ref 8.9–10.3)
Chloride: 100 mmol/L (ref 98–111)
Creatinine, Ser: 0.63 mg/dL (ref 0.44–1.00)
GFR, Estimated: >60 mL/min (ref >60)
Glucose, Bld: 279 mg/dL — ABNORMAL HIGH (ref 70–99)
Potassium: 4 mmol/L (ref 3.5–5.1)
Sodium: 134 mmol/L — ABNORMAL LOW (ref 135–145)

## 2024-01-16 LAB — CBC
HCT: 39.2 % (ref 36.0–46.0)
Hemoglobin: 13.6 g/dL (ref 12.0–15.0)
MCH: 26.5 pg (ref 26.0–34.0)
MCHC: 34.7 g/dL (ref 30.0–36.0)
MCV: 76.4 fL — ABNORMAL LOW (ref 80.0–100.0)
Platelets: 340 10*3/uL (ref 150–400)
RBC: 5.13 MIL/uL — ABNORMAL HIGH (ref 3.87–5.11)
RDW: 14.5 % (ref 11.5–15.5)
WBC: 7.5 10*3/uL (ref 4.0–10.5)
nRBC: 0 % (ref 0.0–0.2)

## 2024-01-16 LAB — GLUCOSE, CAPILLARY
Glucose-Capillary: 244 mg/dL — ABNORMAL HIGH (ref 70–99)
Glucose-Capillary: 226 mg/dL — ABNORMAL HIGH (ref 70–99)
Glucose-Capillary: 333 mg/dL — ABNORMAL HIGH (ref 70–99)
Glucose-Capillary: 284 mg/dL — ABNORMAL HIGH (ref 70–99)

## 2024-01-16 LAB — ECHOCARDIOGRAM COMPLETE
Area-P 1/2: 3.28 cm2
Height: 59 in
S' Lateral: 2.5 cm
Weight: 2599.66 [oz_av]

## 2024-01-16 LAB — HEPARIN LEVEL (UNFRACTIONATED)
Heparin Unfractionated: 0.31 [IU]/mL (ref 0.30–0.70)
Heparin Unfractionated: 0.5 [IU]/mL (ref 0.30–0.70)

## 2024-01-16 MED ORDER — FENTANYL CITRATE PF 50 MCG/ML IJ SOSY
12.5000 ug | PREFILLED_SYRINGE | Freq: Once | INTRAMUSCULAR | Status: AC
  Administered 2024-01-16: 12.5 ug via INTRAVENOUS
  Filled 2024-01-16: qty 1

## 2024-01-16 MED ORDER — INSULIN ASPART 100 UNIT/ML IJ SOLN
5.0000 [IU] | Freq: Three times a day (TID) | INTRAMUSCULAR | Status: DC
  Administered 2024-01-16 – 2024-01-17 (×2): 5 [IU] via SUBCUTANEOUS

## 2024-01-16 MED ORDER — APIXABAN 5 MG PO TABS
5.0000 mg | ORAL_TABLET | Freq: Two times a day (BID) | ORAL | Status: DC
  Administered 2024-01-16 – 2024-01-17 (×3): 5 mg via ORAL
  Filled 2024-01-16 (×3): qty 1
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
## 2024-01-16 NOTE — Discharge Instructions (Signed)
 Information on my medicine - ELIQUIS (apixaban)  This medication education was reviewed with me or my healthcare representative as part of my discharge preparation.    Why was Eliquis prescribed for you? Eliquis was prescribed to treat blood clots that may have been found in the veins of your legs (deep vein thrombosis) or in your lungs (pulmonary embolism) and to reduce the risk of them occurring again.  What do You need to know about Eliquis ? Thw dose is ONE 5 mg tablet taken TWICE daily.  Eliquis may be taken with or without food.   Try to take the dose about the same time in the morning and in the evening. If you have difficulty swallowing the tablet whole please discuss with your pharmacist how to take the medication safely.  Take Eliquis exactly as prescribed and DO NOT stop taking Eliquis without talking to the doctor who prescribed the medication.  Stopping may increase your risk of developing a new blood clot.  Refill your prescription before you run out.  After discharge, you should have regular check-up appointments with your healthcare provider that is prescribing your Eliquis.    What do you do if you miss a dose? If a dose of ELIQUIS is not taken at the scheduled time, take it as soon as possible on the same day and twice-daily administration should be resumed. The dose should not be doubled to make up for a missed dose.  Important Safety Information A possible side effect of Eliquis is bleeding. You should call your healthcare provider right away if you experience any of the following: Bleeding from an injury or your nose that does not stop. Unusual colored urine (red or dark brown) or unusual colored stools (red or black). Unusual bruising for unknown reasons. A serious fall or if you hit your head (even if there is no bleeding).  Some medicines may interact with Eliquis and might increase your risk of bleeding or clotting while on Eliquis. To help avoid this,  consult your healthcare provider or pharmacist prior to using any new prescription or non-prescription medications, including herbals, vitamins, non-steroidal anti-inflammatory drugs (NSAIDs) and supplements.  This website has more information on Eliquis (apixaban): http://[email or web address].com/eliquis/home

## 2024-01-16 NOTE — Plan of Care (Signed)
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
## 2024-01-17 ENCOUNTER — Telehealth (HOSPITAL_COMMUNITY): Payer: Self-pay | Admitting: Pharmacy Technician

## 2024-01-17 ENCOUNTER — Other Ambulatory Visit (HOSPITAL_COMMUNITY): Payer: Self-pay

## 2024-01-17 DIAGNOSIS — M5432 Sciatica, left side: Secondary | ICD-10-CM | POA: Diagnosis not present

## 2024-01-17 DIAGNOSIS — I2699 Other pulmonary embolism without acute cor pulmonale: Secondary | ICD-10-CM | POA: Diagnosis not present

## 2024-01-17 DIAGNOSIS — N2889 Other specified disorders of kidney and ureter: Secondary | ICD-10-CM | POA: Diagnosis not present

## 2024-01-17 DIAGNOSIS — E1165 Type 2 diabetes mellitus with hyperglycemia: Secondary | ICD-10-CM | POA: Diagnosis not present

## 2024-01-17 LAB — CBC
HCT: 38 % (ref 36.0–46.0)
Hemoglobin: 13.5 g/dL (ref 12.0–15.0)
MCH: 26.8 pg (ref 26.0–34.0)
MCHC: 35.5 g/dL (ref 30.0–36.0)
MCV: 75.5 fL — ABNORMAL LOW (ref 80.0–100.0)
Platelets: 324 10*3/uL (ref 150–400)
RBC: 5.03 MIL/uL (ref 3.87–5.11)
RDW: 14.5 % (ref 11.5–15.5)
WBC: 6.1 10*3/uL (ref 4.0–10.5)
nRBC: 0 % (ref 0.0–0.2)

## 2024-01-17 LAB — GLUCOSE, CAPILLARY: Glucose-Capillary: 300 mg/dL — ABNORMAL HIGH (ref 70–99)

## 2024-01-17 MED ORDER — APIXABAN 5 MG PO TABS: 5.0000 mg | ORAL_TABLET | Freq: Two times a day (BID) | ORAL | 3 refills | Status: AC
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
## 2024-01-17 NOTE — Progress Notes (Signed)
 OT Cancellation Note  Patient Details Name: Kristy [name] MRN: [id number] DOB: 22-Mar-1966   Cancelled Treatment:    Reason Eval/Treat Not Completed: OT screened, no needs identified, will sign off Patient and nurse reporting that patient is MI in room for toileting and dressing tasks at this time. No OT needs identified. Patient to d/c home with daughter support when medically stable. OT to sign off. Geofm LEYLAND, MS Acute Rehabilitation Department Office# [phone]  01/17/2024, 8:38 AM

## 2024-01-17 NOTE — Telephone Encounter (Signed)
 Patient Product/process development scientist completed.    The patient is insured through [facility]. Patient has [employer], may use a copay card, and/or apply for patient assistance if available.    Ran test claim for Eliquis 5 mg and the current 30 day co-pay is $594.68.   This test claim was processed through [facility]- copay amounts may vary at other pharmacies due to pharmacy/plan contracts, or as the patient moves through the different stages of their insurance plan.     Reyes Sharps, CPHT Pharmacy Technician III Certified Patient Advocate [facility] Patient Advocate Team Direct Number: [phone]  Fax: [phone]

## 2024-01-17 NOTE — Plan of Care (Signed)
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
## 2024-01-18 ENCOUNTER — Observation Stay (HOSPITAL_COMMUNITY)
Admission: EM | Admit: 2024-01-18 | Discharge: 2024-01-19 | Disposition: A | Discharge status: Home / Self Care | Attending: Family Medicine | Admitting: Family Medicine

## 2024-01-18 ENCOUNTER — Emergency Department (HOSPITAL_COMMUNITY)

## 2024-01-18 ENCOUNTER — Encounter (HOSPITAL_COMMUNITY): Payer: Self-pay | Admitting: Emergency Medicine

## 2024-01-18 ENCOUNTER — Other Ambulatory Visit: Payer: Self-pay

## 2024-01-18 DIAGNOSIS — Z992 Dependence on renal dialysis: Secondary | ICD-10-CM | POA: Diagnosis not present

## 2024-01-18 DIAGNOSIS — R519 Headache, unspecified: Secondary | ICD-10-CM | POA: Diagnosis not present

## 2024-01-18 DIAGNOSIS — F10920 Alcohol use, unspecified with intoxication, uncomplicated: Secondary | ICD-10-CM | POA: Diagnosis not present

## 2024-01-18 DIAGNOSIS — F17210 Nicotine dependence, cigarettes, uncomplicated: Secondary | ICD-10-CM | POA: Diagnosis not present

## 2024-01-18 DIAGNOSIS — E1165 Type 2 diabetes mellitus with hyperglycemia: Secondary | ICD-10-CM | POA: Diagnosis not present

## 2024-01-18 DIAGNOSIS — I2699 Other pulmonary embolism without acute cor pulmonale: Secondary | ICD-10-CM | POA: Insufficient documentation

## 2024-01-18 DIAGNOSIS — E871 Hypo-osmolality and hyponatremia: Secondary | ICD-10-CM | POA: Insufficient documentation

## 2024-01-18 DIAGNOSIS — N2889 Other specified disorders of kidney and ureter: Secondary | ICD-10-CM | POA: Diagnosis not present

## 2024-01-18 DIAGNOSIS — R112 Nausea with vomiting, unspecified: Secondary | ICD-10-CM | POA: Insufficient documentation

## 2024-01-18 DIAGNOSIS — Z794 Long term (current) use of insulin: Secondary | ICD-10-CM

## 2024-01-18 DIAGNOSIS — Z1152 Encounter for screening for COVID-19: Secondary | ICD-10-CM | POA: Insufficient documentation

## 2024-01-18 LAB — CBC
HCT: 41.4 % (ref 36.0–46.0)
Hemoglobin: 14.1 g/dL (ref 12.0–15.0)
MCH: 26.5 pg (ref 26.0–34.0)
MCHC: 34.1 g/dL (ref 30.0–36.0)
MCV: 77.7 fL — ABNORMAL LOW (ref 80.0–100.0)
Platelets: 360 10*3/uL (ref 150–400)
RBC: 5.33 MIL/uL — ABNORMAL HIGH (ref 3.87–5.11)
RDW: 14.9 % (ref 11.5–15.5)
WBC: 7.8 10*3/uL (ref 4.0–10.5)
nRBC: 0 % (ref 0.0–0.2)

## 2024-01-18 LAB — COMPREHENSIVE METABOLIC PANEL WITH GFR
ALT: 34 U/L (ref 0–44)
AST: 25 U/L (ref 15–41)
Albumin: 3.8 g/dL (ref 3.5–5.0)
Alkaline Phosphatase: 132 U/L — ABNORMAL HIGH (ref 38–126)
Anion gap: 8 (ref 5–15)
BUN: 17 mg/dL (ref 6–20)
CO2: 28 mmol/L (ref 22–32)
Calcium: 9.8 mg/dL (ref 8.9–10.3)
Chloride: 97 mmol/L — ABNORMAL LOW (ref 98–111)
Creatinine, Ser: 0.55 mg/dL (ref 0.44–1.00)
GFR, Estimated: >60 mL/min (ref >60)
Glucose, Bld: 179 mg/dL — ABNORMAL HIGH (ref 70–99)
Potassium: 4.2 mmol/L (ref 3.5–5.1)
Sodium: 133 mmol/L — ABNORMAL LOW (ref 135–145)
Total Bilirubin: 1.1 mg/dL (ref 0.0–1.2)
Total Protein: 8.1 g/dL (ref 6.5–8.1)

## 2024-01-18 LAB — CBG MONITORING, ED: Glucose-Capillary: 168 mg/dL — ABNORMAL HIGH (ref 70–99)

## 2024-01-18 MED ORDER — PROCHLORPERAZINE MALEATE 10 MG PO TABS
10.0000 mg | ORAL_TABLET | Freq: Once | ORAL | Status: AC
  Administered 2024-01-18: 10 mg via ORAL
  Filled 2024-01-18: qty 1

## 2024-01-18 MED ORDER — SUMATRIPTAN SUCCINATE 50 MG PO TABS
50.0000 mg | ORAL_TABLET | Freq: Once | ORAL | Status: AC
  Administered 2024-01-18: 50 mg via ORAL
  Filled 2024-01-18 (×2): qty 1

## 2024-01-18 MED ORDER — ACETAMINOPHEN 650 MG RE SUPP: 650.0000 mg | Freq: Four times a day (QID) | RECTAL | Status: DC | PRN

## 2024-01-18 MED ORDER — HYDROMORPHONE HCL 1 MG/ML IJ SOLN
0.5000 mg | INTRAMUSCULAR | Status: DC | PRN
  Administered 2024-01-19: 0.5 mg via INTRAVENOUS
  Filled 2024-01-18: qty 0.5

## 2024-01-18 MED ORDER — DIPHENHYDRAMINE HCL 25 MG PO CAPS
25.0000 mg | ORAL_CAPSULE | Freq: Once | ORAL | Status: AC
  Administered 2024-01-18: 25 mg via ORAL
  Filled 2024-01-18: qty 1

## 2024-01-18 MED ORDER — APIXABAN 5 MG PO TABS
5.0000 mg | ORAL_TABLET | Freq: Two times a day (BID) | ORAL | Status: DC
  Administered 2024-01-18 – 2024-01-19 (×2): 5 mg via ORAL
  Filled 2024-01-18 (×2): qty 1

## 2024-01-18 MED ORDER — SODIUM CHLORIDE 0.9 % IV BOLUS
500.0000 mL | Freq: Once | INTRAVENOUS | Status: AC
  Administered 2024-01-18: 500 mL via INTRAVENOUS

## 2024-01-18 MED ORDER — ACETAMINOPHEN 325 MG PO TABS: 650.0000 mg | ORAL_TABLET | Freq: Four times a day (QID) | ORAL | Status: DC | PRN

## 2024-01-18 MED ORDER — METHOCARBAMOL 500 MG PO TABS
750.0000 mg | ORAL_TABLET | Freq: Four times a day (QID) | ORAL | Status: DC | PRN
  Administered 2024-01-19: 750 mg via ORAL
  Filled 2024-01-18 (×2): qty 2

## 2024-01-18 MED ORDER — INSULIN GLARGINE-YFGN 100 UNIT/ML ~~LOC~~ SOLN
20.0000 [IU] | Freq: Every day | SUBCUTANEOUS | Status: DC
  Administered 2024-01-19: 20 [IU] via SUBCUTANEOUS
  Filled 2024-01-18: qty 0.2

## 2024-01-18 MED ORDER — ONDANSETRON HCL 4 MG/2ML IJ SOLN
4.0000 mg | Freq: Four times a day (QID) | INTRAMUSCULAR | Status: DC | PRN
  Administered 2024-01-19: 4 mg via INTRAVENOUS
  Filled 2024-01-18: qty 2

## 2024-01-18 MED ORDER — INSULIN ASPART 100 UNIT/ML IJ SOLN
0.0000 [IU] | Freq: Every day | INTRAMUSCULAR | Status: DC
  Filled 2024-01-18: qty 0.05

## 2024-01-18 MED ORDER — ACETAMINOPHEN 500 MG PO TABS
1000.0000 mg | ORAL_TABLET | Freq: Once | ORAL | Status: AC
  Administered 2024-01-18: 1000 mg via ORAL
  Filled 2024-01-18: qty 2

## 2024-01-18 MED ORDER — HYDROCODONE-ACETAMINOPHEN 5-325 MG PO TABS: 1.0000 | ORAL_TABLET | ORAL | Status: DC | PRN

## 2024-01-18 MED ORDER — HYDROMORPHONE HCL 1 MG/ML IJ SOLN: 0.5000 mg | INTRAMUSCULAR | Status: DC | PRN

## 2024-01-18 MED ORDER — ONDANSETRON HCL 4 MG PO TABS: 4.0000 mg | ORAL_TABLET | Freq: Four times a day (QID) | ORAL | Status: DC | PRN

## 2024-01-18 MED ORDER — POLYETHYLENE GLYCOL 3350 17 G PO PACK: 17.0000 g | PACK | Freq: Every day | ORAL | Status: DC | PRN

## 2024-01-18 MED ORDER — INSULIN ASPART 100 UNIT/ML IJ SOLN
0.0000 [IU] | Freq: Three times a day (TID) | INTRAMUSCULAR | Status: DC
  Administered 2024-01-19: 3 [IU] via SUBCUTANEOUS
  Administered 2024-01-19: 9 [IU] via SUBCUTANEOUS
  Filled 2024-01-18: qty 0.09

## 2024-01-18 MED ORDER — IBUPROFEN 400 MG PO TABS
400.0000 mg | ORAL_TABLET | Freq: Four times a day (QID) | ORAL | Status: DC | PRN
  Administered 2024-01-19: 400 mg via ORAL
  Filled 2024-01-18: qty 1

## 2024-01-18 MED ORDER — ONDANSETRON 4 MG PO TBDP
4.0000 mg | ORAL_TABLET | Freq: Once | ORAL | Status: AC
  Administered 2024-01-18: 4 mg via ORAL
  Filled 2024-01-18: qty 1

## 2024-01-18 NOTE — ED Provider Notes (Signed)
 [place] EMERGENCY DEPARTMENT AT [facility] Provider Note   CSN: [id number] Arrival date & time: 01/18/24  1335     Patient presents with: Headache, Dizziness, and Shortness of Breath   Kristy [name] is a 58 y.o. female.    58 year old female presenting with multiple complaints.  She complains of 9/10 headache that began around 10 AM, primarily right-sided, denies changes in vision, has experienced nausea/vomiting with headache.  She was discharged from the hospital yesterday after receiving a diagnosis of pulmonary embolism, she was started on Eliquis .  She has a cough, says that this comes and goes but is slightly worse today, reports feeling shortness of breath, stating when I get up I feel like I am going to pass out.  Denies chest pain, lower extremity edema, non-exertional shortness of breath.  She took a tramadol  this morning which did not result in relief of her headache.   Headache Associated symptoms: dizziness   Dizziness Associated symptoms: headaches and shortness of breath   Shortness of Breath Associated symptoms: headaches        Prior to Admission medications   Medication Sig Start Date End Date Taking? Authorizing Provider  acetaminophen  (TYLENOL ) 500 MG tablet Take 2 tablets (1,000 mg total) by mouth every 6 (six) hours as needed. 01/03/24   Augustus Almarie RAMAN, PA-C  apixaban  (ELIQUIS ) 5 MG TABS tablet Take 1 tablet (5 mg total) by mouth 2 (two) times daily. Take for 3 months 01/17/24   Drusilla Sabas RAMAN, MD  docusate sodium  (COLACE) 100 MG capsule Take 1 capsule (100 mg total) by mouth 2 (two) times daily. 01/03/24   Augustus Almarie RAMAN, PA-C  glucose blood (AGAMATRIX PRESTO TEST) test strip Use as instructed 05/06/18   Delores Suzann HERO, MD  Insulin  Glargine (LANTUS  SOLOSTAR) 100 UNIT/ML Solostar Pen Inject 31 Units into the skin 2 (two) times daily. Patient taking differently: Inject 25 Units into the skin daily. 03/30/19   Delores Suzann HERO, MD  insulin   lispro (HUMALOG ) 100 UNIT/ML KiwkPen Inject 0.1 mLs (10 Units total) into the skin 2 (two) times daily. Patient taking differently: Inject 12 Units into the skin 2 (two) times daily. 05/06/18   Delores Suzann HERO, MD  Insulin  Pen Needle (BD ULTRA-FINE PEN NEEDLES) 29G X 12.7MM MISC Check blood sugar 4x daily 05/06/18   Delores Suzann HERO, MD  Lancets MISC Check BG prior to administering insulin  05/06/18   Delores Suzann HERO, MD  levETIRAcetam  (KEPPRA ) 500 MG tablet Take 1 tablet (500 mg total) by mouth 2 (two) times daily for 5 days. 01/03/24 01/08/24  Augustus Almarie RAMAN, PA-C  metFORMIN  (GLUCOPHAGE -XR) 500 MG 24 hr tablet Take 1,000 mg by mouth 2 (two) times daily. 09/10/21   [provider]  methocarbamol  (ROBAXIN ) 500 MG tablet Take 2 tablets (1,000 mg total) by mouth every 8 (eight) hours as needed for muscle spasms. Patient not taking: Reported on 01/15/2024 01/03/24   Augustus Almarie RAMAN, PA-C  ondansetron  (ZOFRAN ) 4 MG tablet Take 1 tablet (4 mg total) by mouth every 6 (six) hours. Patient not taking: Reported on 01/15/2024 01/05/24   Marylu Gee, DO  traMADol  (ULTRAM ) 50 MG tablet Take 1 tablet (50 mg total) by mouth every 6 (six) hours as needed (severe pain). 01/03/24   Simaan, Elizabeth S, PA-C    Allergies: Fish-derived products, Other, Fish allergy, and Oxycodone    Review of Systems  Respiratory:  Positive for shortness of breath.   Neurological:  Positive for dizziness and headaches.  Updated Vital Signs  Vitals:   01/18/24 1700 01/18/24 1850 01/18/24 1855 01/18/24 1900  BP: 130/78   124/76  Pulse: 86   90  Resp: 16   18  Temp:  98.3 F (36.8 C)    TempSrc:  Oral    SpO2: 94%   93%  Weight:   73.7 kg   Height:   4' 11 (1.499 m)      Physical Exam Vitals and nursing note reviewed.  HENT:     Head: Normocephalic.   Eyes:     Extraocular Movements: Extraocular movements intact.     Pupils: Pupils are equal, round, and reactive to light.     Comments: No nystagmus    Cardiovascular:     Rate and Rhythm: Normal rate and regular rhythm.  Pulmonary:     Effort: Pulmonary effort is normal.     Breath sounds: Normal breath sounds.   Musculoskeletal:     Cervical back: Normal range of motion and neck supple. No rigidity or tenderness.     Comments: Moves all extremities spontaneously without difficulty 5 out of 5 strength against resistance of bilateral upper and lower extremities Grip strength intact and equal bilaterally   Neurological:     General: No focal deficit present.     Mental Status: She is alert and oriented to person, place, and time.     Sensory: No sensory deficit.     Motor: No weakness.     Comments: Facial expressions are intact and symmetric without evidence of facial droop Normal cerebellar testing including finger to nose    (all labs ordered are listed, but only abnormal results are displayed) Labs Reviewed  CBC - Abnormal; Notable for the following components:      Result Value   RBC 5.33 (*)    MCV 77.7 (*)    All other components within normal limits  COMPREHENSIVE METABOLIC PANEL WITH GFR - Abnormal; Notable for the following components:   Sodium 133 (*)    Chloride 97 (*)    Glucose, Bld 179 (*)    Alkaline Phosphatase 132 (*)    All other components within normal limits    EKG: None  Radiology: CT Head Wo Contrast Result Date: 01/18/2024 CLINICAL DATA:  Provided history: Headache. Dizziness. Shortness of breath. EXAM: CT HEAD WITHOUT CONTRAST TECHNIQUE: Contiguous axial images were obtained from the base of the skull through the vertex without intravenous contrast. RADIATION DOSE REDUCTION: This exam was performed according to the departmental dose-optimization program which includes automated exposure control, adjustment of the mA and/or kV according to patient size and/or use of iterative reconstruction technique. COMPARISON:  Prior head CT examinations 01/15/2024 and earlier. FINDINGS: Brain: No age-advanced  or lobar predominant cerebral atrophy. Trace residual subdural hemorrhage along the mid and posterior falx, decreased from the head CT of 09/17/2023. No evidence no interval acute intracranial hemorrhage. No demarcated cortical infarct. No evidence of an intracranial mass. No midline shift. Vascular: No hyperdense vessel.  Atherosclerotic calcifications. Skull: No calvarial fracture or aggressive osseous lesion. Sinuses/Orbits: No mass or acute finding within the imaged orbits. Trace mucosal thickening scattered within the paranasal sinuses at the imaged levels. IMPRESSION: 1. Trace residual subdural hemorrhage along the mid and posterior falx, decreased from the head CT of 09/17/2023. 2. No evidence of an interval acute intracranial abnormality. Electronically Signed   By: Rockey Childs D.O.   On: 01/18/2024 16:02   DG Chest 2 View Result Date: 01/18/2024 CLINICAL DATA:  Shortness of breath EXAM: CHEST - 2 VIEW COMPARISON:  Chest x-ray 01/15/2024 FINDINGS: The heart is enlarged. There central pulmonary vascular congestion. Multifocal airspace opacities in the mid and lower lungs bilaterally have not significantly changed. There is no pleural effusion or pneumothorax identified. No acute fractures are identified. IMPRESSION: 1. Cardiomegaly with central pulmonary vascular congestion. 2. Stable multifocal airspace opacities in the mid and lower lungs. Electronically Signed   By: Greig Pique M.D.   On: 01/18/2024 15:48     Procedures   Medications Ordered in the ED  prochlorperazine  (COMPAZINE ) tablet 10 mg (has no administration in time range)  diphenhydrAMINE  (BENADRYL ) capsule 25 mg (has no administration in time range)  sodium chloride  0.9 % bolus 500 mL (has no administration in time range)  ondansetron  (ZOFRAN -ODT) disintegrating tablet 4 mg (4 mg Oral Given 01/18/24 1615)  acetaminophen  (TYLENOL ) tablet 1,000 mg (1,000 mg Oral Given 01/18/24 1732)                                    Medical  Decision Making This patient presents to the ED for concern of headache, this involves an extensive number of treatment options, and is a complaint that carries with it a high risk of complications and morbidity.  The differential diagnosis includes intracranial bleed, cluster/tension/migraine headache, dehydration, metastasis.   Co morbidities that complicate the patient evaluation  Pulmonary embolism on Eliquis , type 2 diabetes, renal cell carcinoma   Additional history obtained:  Additional history obtained from record review External records from outside source obtained and reviewed including recent hospital discharge summary   Lab Tests:  I Ordered, and personally interpreted labs.  The pertinent results include: CBC largely stable as compared to previous, CMP notable for mild hyponatremia, hyperglycemia, alk phos is elevated as compared to previous.    Imaging Studies ordered:  I ordered imaging studies including head CT, CXR  I independently visualized and interpreted imaging which showed  - head CT: 1. Trace residual subdural hemorrhage along the mid and posterior falx, decreased from the head CT of 09/17/2023. 2. No evidence of an interval acute intracranial abnormality. - CXR: 1. Cardiomegaly with central pulmonary vascular congestion. 2. Stable multifocal airspace opacities in the mid and lower lungs.  I agree with the radiologist interpretation   Cardiac Monitoring: / EKG:  The patient was maintained on a cardiac monitor.  I personally viewed and interpreted the cardiac monitored which showed an underlying rhythm of: NSR   Consultations Obtained:  I requested consultation with the hospitalist,  and discussed lab and imaging findings as well as pertinent plan - they recommend: I spoke with Dr. Charlton who will admit the patient.   Problem List / ED Course / Critical interventions / Medication management   I ordered medication including Tylenol   for headache without  improvement, then compazine  and benadryl  for headache  Reevaluation of the patient after these medicines showed that the patient stayed the same I have reviewed the patients home medicines and have made adjustments as needed   Social Determinants of Health:  Tobacco use   Test / Admission - Considered:  Physical exam largely unremarkable, no focal neurodeficits.  Patient continues to experience headache after dose of Tylenol , will provide migraine cocktail of Compazine  and Benadryl  with IV fluids and then reassess patient's status.  She had 1 episode of vomiting when she initially presented to the emergency department, none since. Patient  has recent history of subdural/subarachnoid hemorrhage that was initially noted on 01/01/2024, this appears improved based on today's CT imaging.  Patient was recently hospitalized for pulmonary embolism and started on Eliquis  yesterday after being transitioned off of heparin . Upon reassessment, after Tylenol , Compazine , Benadryl  and IV fluids, the patient has minimal if any improvement in her symptoms, although she is not vomiting and has been able to tolerate p.o. intake.  Patient expresses concern in being discharged, stating that she does not feel comfortable going home with the level of headache she currently has.  I am concerned for this patient's worsening clinical condition, given her recent high risk state of subdural/subarachnoid hemorrhage as well as recent initiation of Eliquis  in the setting of a pulmonary embolism.  I spoke with Dr. Charlton with the hospitalist team who is planning to admit this patient.  Amount and/or Complexity of Data Reviewed Labs: ordered. Radiology: ordered.  Risk OTC drugs. Prescription drug management. Decision regarding hospitalization.        Final diagnoses:  Acute intractable headache, unspecified headache type  Nausea and vomiting, unspecified vomiting type    ED Discharge Orders     None           Glendia Rocky LOISE DEVONNA 01/18/24 2153    Garrick Charleston, MD 01/23/24 [phone]

## 2024-01-18 NOTE — ED Notes (Signed)
 ED TO INPATIENT HANDOFF REPORT  ED Nurse Name and Phone #: Joaquim [phone]  S Name/Age/Gender Kristy [name] 58 y.o. female Room/Bed: WA04/WA04  Code Status   Code Status: Full Code  Home/SNF/Other Home Patient oriented to: self, place, time, and situation Is this baseline? Yes   Triage Complete: Triage complete  Chief Complaint Headache [R51.9]  Triage Note Pt c/o right side headache with dizziness, nausea, and intermittent SHOB. Pt recently started on Eliquis 3 days ago for PE   Allergies Allergies  Allergen Reactions   Fish-Derived Products Anaphylaxis and Hives    hives   Other Anaphylaxis and Hives    hives    Fish Allergy Hives    hives   Oxycodone Itching and Dermatitis    Level of Care/Admitting Diagnosis ED Disposition     ED Disposition  Admit   Condition  --   Comment  Hospital Area: [facility] [[id number]]  Level of Care: Med-Surg [16]  May place patient in observation at [facility] or [facility] if equivalent level of care is available:: Yes  Covid Evaluation: Asymptomatic - no recent exposure (last 10 days) testing not required  Diagnosis: Headache [[id number]]  Admitting Physician: CHARLTON EVALENE RAMAN [[id number]]  Attending Physician: CHARLTON EVALENE RAMAN [[id number]]          B Medical/Surgery History Past Medical History:  Diagnosis Date   Anemia    Cancer (HCC)    Chronic back pain    Diabetes mellitus type II    Hyperlipidemia    Kidney stones    Uterine cancer (HCC)    Past Surgical History:  Procedure Laterality Date   ABDOMINAL HYSTERECTOMY     CESAREAN SECTION     KIDNEY STONE SURGERY     NO PAST SURGERIES     TUBAL LIGATION       A IV Location/Drains/Wounds Patient Lines/Drains/Airways Status     Active Line/Drains/Airways     Name Placement date Placement time Site Days   Peripheral IV 01/18/24 20 G 1 Left;Posterior Wrist 01/18/24  1932  Wrist  less than 1            Intake/Output Last 24 hours No  intake or output data in the 24 hours ending 01/18/24 2225  Labs/Imaging Results for orders placed or performed during the hospital encounter of 01/18/24 (from the past 48 hours)  CBC     Status: Abnormal   Collection Time: 01/18/24  2:25 PM  Result Value Ref Range   WBC 7.8 4.0 - 10.5 K/uL   RBC 5.33 (H) 3.87 - 5.11 MIL/uL   Hemoglobin 14.1 12.0 - 15.0 g/dL   HCT 58.5 63.9 - 53.9 %   MCV 77.7 (L) 80.0 - 100.0 fL   MCH 26.5 26.0 - 34.0 pg   MCHC 34.1 30.0 - 36.0 g/dL   RDW 85.0 88.4 - 84.4 %   Platelets 360 150 - 400 K/uL   nRBC 0.0 0.0 - 0.2 %    Comment: Performed at [facility], [address], [place], [place] [address]  Comprehensive metabolic panel with GFR     Status: Abnormal   Collection Time: 01/18/24  6:00 PM  Result Value Ref Range   Sodium 133 (L) 135 - 145 mmol/L   Potassium 4.2 3.5 - 5.1 mmol/L   Chloride 97 (L) 98 - 111 mmol/L   CO2 28 22 - 32 mmol/L   Glucose, Bld 179 (H) 70 - 99 mg/dL    Comment:  Glucose reference range applies only to samples taken after fasting for at least 8 hours.   BUN 17 6 - 20 mg/dL   Creatinine, Ser 9.44 0.44 - 1.00 mg/dL   Calcium  9.8 8.9 - 10.3 mg/dL   Total Protein 8.1 6.5 - 8.1 g/dL   Albumin 3.8 3.5 - 5.0 g/dL   AST 25 15 - 41 U/L   ALT 34 0 - 44 U/L   Alkaline Phosphatase 132 (H) 38 - 126 U/L   Total Bilirubin 1.1 0.0 - 1.2 mg/dL   GFR, Estimated >39 >39 mL/min    Comment: (NOTE) Calculated using the CKD-EPI Creatinine Equation (2021)    Anion gap 8 5 - 15    Comment: Performed at [facility], [address] [place], [place] [address]   CT Head Wo Contrast Result Date: 01/18/2024 CLINICAL DATA:  Provided history: Headache. Dizziness. Shortness of breath. EXAM: CT HEAD WITHOUT CONTRAST TECHNIQUE: Contiguous axial images were obtained from the base of the skull through the vertex without intravenous contrast. RADIATION DOSE REDUCTION: This exam was performed according to the  departmental dose-optimization program which includes automated exposure control, adjustment of the mA and/or kV according to patient size and/or use of iterative reconstruction technique. COMPARISON:  Prior head CT examinations 01/15/2024 and earlier. FINDINGS: Brain: No age-advanced or lobar predominant cerebral atrophy. Trace residual subdural hemorrhage along the mid and posterior falx, decreased from the head CT of 09/17/2023. No evidence no interval acute intracranial hemorrhage. No demarcated cortical infarct. No evidence of an intracranial mass. No midline shift. Vascular: No hyperdense vessel.  Atherosclerotic calcifications. Skull: No calvarial fracture or aggressive osseous lesion. Sinuses/Orbits: No mass or acute finding within the imaged orbits. Trace mucosal thickening scattered within the paranasal sinuses at the imaged levels. IMPRESSION: 1. Trace residual subdural hemorrhage along the mid and posterior falx, decreased from the head CT of 09/17/2023. 2. No evidence of an interval acute intracranial abnormality. Electronically Signed   By: Rockey Childs D.O.   On: 01/18/2024 16:02   DG Chest 2 View Result Date: 01/18/2024 CLINICAL DATA:  Shortness of breath EXAM: CHEST - 2 VIEW COMPARISON:  Chest x-ray 01/15/2024 FINDINGS: The heart is enlarged. There central pulmonary vascular congestion. Multifocal airspace opacities in the mid and lower lungs bilaterally have not significantly changed. There is no pleural effusion or pneumothorax identified. No acute fractures are identified. IMPRESSION: 1. Cardiomegaly with central pulmonary vascular congestion. 2. Stable multifocal airspace opacities in the mid and lower lungs. Electronically Signed   By: Greig Pique M.D.   On: 01/18/2024 15:48    Pending Labs Unresulted Labs (From admission, onward)     Start     Ordered   01/19/24 0500  Basic metabolic panel  Tomorrow morning,   R        01/18/24 2151   01/19/24 0500  CBC  Tomorrow morning,   R         01/18/24 2151            Vitals/Pain Today's Vitals   01/18/24 1850 01/18/24 1853 01/18/24 1855 01/18/24 1900  BP:    124/76  Pulse:    90  Resp:    18  Temp: 98.3 F (36.8 C)     TempSrc: Oral     SpO2:    93%  Weight:   73.7 kg   Height:   4' 11 (1.499 m)   PainSc:  9       Isolation Precautions No  active isolations  Medications Medications  apixaban (ELIQUIS) tablet 5 mg (has no administration in time range)  insulin  glargine-yfgn (SEMGLEE ) injection 20 Units (has no administration in time range)  insulin  aspart (novoLOG ) injection 0-9 Units (has no administration in time range)  insulin  aspart (novoLOG ) injection 0-5 Units (has no administration in time range)  acetaminophen  (TYLENOL ) tablet 650 mg (has no administration in time range)    Or  acetaminophen  (TYLENOL ) suppository 650 mg (has no administration in time range)  polyethylene glycol (MIRALAX  / GLYCOLAX ) packet 17 g (has no administration in time range)  ondansetron  (ZOFRAN ) tablet 4 mg (has no administration in time range)    Or  ondansetron  (ZOFRAN ) injection 4 mg (has no administration in time range)  methocarbamol  (ROBAXIN ) tablet 750 mg (has no administration in time range)  SUMAtriptan (IMITREX) tablet 50 mg (has no administration in time range)  HYDROmorphone  (DILAUDID ) injection 0.5 mg (has no administration in time range)  ibuprofen  (ADVIL ) tablet 400 mg (has no administration in time range)  ondansetron  (ZOFRAN -ODT) disintegrating tablet 4 mg (4 mg Oral Given 01/18/24 1615)  acetaminophen  (TYLENOL ) tablet 1,000 mg (1,000 mg Oral Given 01/18/24 1732)  prochlorperazine (COMPAZINE) tablet 10 mg (10 mg Oral Given 01/18/24 2013)  diphenhydrAMINE  (BENADRYL ) capsule 25 mg (25 mg Oral Given 01/18/24 2013)  sodium chloride  0.9 % bolus 500 mL (500 mLs Intravenous Bolus 01/18/24 2015)    Mobility walks     Focused Assessments    R Recommendations: See Admitting Provider Note  Report given to:    Additional Notes:  '

## 2024-01-18 NOTE — ED Provider Triage Note (Addendum)
 Emergency Medicine Provider Triage Evaluation Note  Kristy [name] , a 58 y.o. female  was evaluated in triage.  Pt complains of headache, cough, shortness of breath.  Patient was discharged from the hospital yesterday after recently diagnosed with a pulmonary embolism, was started on Eliquis.  Headache began around 10 AM, is primarily localized to the right side of her head, no changes in vision but is associated with nausea without vomiting.  Reports that her cough comes and goes is worse today, reports feeling shortness of breath when I get up I feel like I am going to pass out.  Denies chest pain.  She reports taking 1 pain pill this morning with minimal relief of symptoms.  Review of Systems  Positive: As above Negative: As above  Physical Exam  BP 129/81 (BP Location: Left Arm)   Pulse 83   Temp 98.5 F (36.9 C) (Oral)   Resp 16   LMP 07/14/2016 (Exact Date)   SpO2 99%  Gen:   Awake, no distress   Resp:  Normal effort  MSK:   Moves extremities without difficulty  Other:  EOM's in-tact, PERRL, no facial asymmetry or evidence of facial droop, no sensory deficits  Medical Decision Making  Medically screening exam initiated at 2:18 PM.  Appropriate orders placed.  Kristy [name] was informed that the remainder of the evaluation will be completed by another provider, this initial triage assessment does not replace that evaluation, and the importance of remaining in the ED until their evaluation is complete.     Kristy Rocky SAILOR, PA-C 01/18/24 1421    Kristy [name], [place] 01/18/24 1421

## 2024-01-18 NOTE — ED Triage Notes (Addendum)
 Pt c/o right side headache with dizziness, nausea, and intermittent SHOB. Pt recently started on Eliquis 3 days ago for PE
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
## 2024-01-19 DIAGNOSIS — R519 Headache, unspecified: Secondary | ICD-10-CM | POA: Diagnosis not present

## 2024-01-19 LAB — BASIC METABOLIC PANEL WITH GFR
Anion gap: 9 (ref 5–15)
BUN: 14 mg/dL (ref 6–20)
CO2: 26 mmol/L (ref 22–32)
Calcium: 9.5 mg/dL (ref 8.9–10.3)
Chloride: 96 mmol/L — ABNORMAL LOW (ref 98–111)
Creatinine, Ser: 0.68 mg/dL (ref 0.44–1.00)
GFR, Estimated: >60 mL/min (ref >60)
Glucose, Bld: 174 mg/dL — ABNORMAL HIGH (ref 70–99)
Potassium: 4.1 mmol/L (ref 3.5–5.1)
Sodium: 131 mmol/L — ABNORMAL LOW (ref 135–145)

## 2024-01-19 LAB — CBC
HCT: 41.1 % (ref 36.0–46.0)
Hemoglobin: 13.7 g/dL (ref 12.0–15.0)
MCH: 26.2 pg (ref 26.0–34.0)
MCHC: 33.3 g/dL (ref 30.0–36.0)
MCV: 78.7 fL — ABNORMAL LOW (ref 80.0–100.0)
Platelets: 373 10*3/uL (ref 150–400)
RBC: 5.22 MIL/uL — ABNORMAL HIGH (ref 3.87–5.11)
RDW: 15 % (ref 11.5–15.5)
WBC: 6.9 10*3/uL (ref 4.0–10.5)
nRBC: 0 % (ref 0.0–0.2)

## 2024-01-19 LAB — GLUCOSE, CAPILLARY
Glucose-Capillary: 204 mg/dL — ABNORMAL HIGH (ref 70–99)
Glucose-Capillary: 373 mg/dL — ABNORMAL HIGH (ref 70–99)

## 2024-01-19 MED ORDER — METHOCARBAMOL 500 MG PO TABS: 500.0000 mg | ORAL_TABLET | Freq: Three times a day (TID) | ORAL | 0 refills | Status: AC | PRN

## 2024-01-19 NOTE — TOC Transition Note (Signed)
 Transition of Care [facility]) - Discharge Note   Patient Details  Name: Kristy [name] MRN: [id number] Date of Birth: Nov 12, 1965  [facility] CM/SW Contact:  Alfonse JONELLE Rex, RN Phone Number: 01/19/2024, 3:01 PM   Clinical Narrative:  DC to Home. No TOC needs.             Patient Goals and CMS Choice            Discharge Placement                       Discharge Plan and Services Additional resources added to the After Visit Summary for                                       Social Drivers of Health (SDOH) Interventions SDOH Screenings   Food Insecurity: No Food Insecurity (01/18/2024)  Housing: Low Risk  (01/18/2024)  Transportation Needs: No Transportation Needs (01/18/2024)  Utilities: Not At Risk (01/18/2024)  Tobacco Use: High Risk (01/18/2024)     Readmission Risk Interventions    01/15/2024    3:37 PM  Readmission Risk Prevention Plan  Transportation Screening Complete  PCP or Specialist Appt within 5-7 Days Complete  Home Care Screening Complete  Medication Review (RN CM) Complete
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
## 2024-05-23 ENCOUNTER — Encounter (HOSPITAL_COMMUNITY): Payer: Self-pay

## 2024-05-23 ENCOUNTER — Emergency Department (HOSPITAL_COMMUNITY)
Admission: EM | Admit: 2024-05-23 | Discharge: 2024-05-24 | Disposition: A | Discharge status: Home / Self Care | Attending: Emergency Medicine | Admitting: Emergency Medicine

## 2024-05-23 ENCOUNTER — Other Ambulatory Visit: Payer: Self-pay

## 2024-05-23 DIAGNOSIS — Z794 Long term (current) use of insulin: Secondary | ICD-10-CM | POA: Insufficient documentation

## 2024-05-23 DIAGNOSIS — Z7984 Long term (current) use of oral hypoglycemic drugs: Secondary | ICD-10-CM | POA: Diagnosis not present

## 2024-05-23 DIAGNOSIS — Z7901 Long term (current) use of anticoagulants: Secondary | ICD-10-CM | POA: Insufficient documentation

## 2024-05-23 DIAGNOSIS — R519 Headache, unspecified: Secondary | ICD-10-CM | POA: Diagnosis present

## 2024-05-23 DIAGNOSIS — E119 Type 2 diabetes mellitus without complications: Secondary | ICD-10-CM | POA: Diagnosis not present

## 2024-05-23 NOTE — ED Triage Notes (Addendum)
 Right sided headache that started a week ago, c/o ear pain, right side of face hurting. Pt is on eliquis . Denies extremity weakness. Denies vision changes, denies balance issues

## 2024-05-24 ENCOUNTER — Emergency Department (HOSPITAL_COMMUNITY)

## 2024-05-24 MED ORDER — ACETAMINOPHEN 500 MG PO TABS
1000.0000 mg | ORAL_TABLET | Freq: Once | ORAL | Status: AC
  Administered 2024-05-24: 1000 mg via ORAL
  Filled 2024-05-24: qty 2
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
## 2024-05-24 NOTE — Discharge Instructions (Signed)
 Your CT head today did not show any acute intracranial findings.  There was no congestion noted around your sinuses. I have placed a referral to neurology for ongoing management of your headaches. You may wish to follow-up with your primary care doctor in the interim. Can continue Tylenol  as needed.  I would avoid NSAIDs or aspirin given your use of Eliquis . Can return here for new concerns.

## 2024-06-01 NOTE — H&P (Signed)
 Urology History and Physical  Subjective: Patient is a 58 y.o. female with a history of Right renal mass and microhematuria.   They would like to proceed with  NEPHRECTOMY PARTIAL ROBOTIC XI (Right), possible conversion to open, possible right radical nephrectomy, cystoscopy   .   No new medical history, medicines, or recent illnesses noted.    Patient takes Eliquis . Last taken Sunday.  Urine culture results reviewed.  02/28/24 with no growth.  No results found for the last 90 days.   Medical History[1]  Surgical History[2]  Prescriptions Prior to Admission[3] Allergies[4]     Family History[5]   Review of Systems Constitutional symptoms: Denies fevers, chills Eyes:  Denies changes in vision  Ear, nose, throat:  Denies sore throat, headaches Cardiovascular: Denies chest pain, palpitations Respiratory:  Denies SOB Gastrointestinal:  Denies nausea, vomiting diarrhea Genitourinary:  Denies hematuria Skin:  Denies skin changes Neurological:  Denies changes in sensation or motor function Musculoskeletal:  Denies joint pain issues Psychiatric:  Negative Endocrine:  Negative Hematological: Negative Allergic:  Negative  Objective: Vital signs in last 24 hours: Temp:  [97 F (36.1 C)] 97 F (36.1 C) Heart Rate:  [91] 91 Resp:  [20] 20 BP: (133)/(81) 133/81  General: Awake, in no apparent distress Chest: Equal chest rise bilaterally, no increased work of breathing Cardiac: Hemodynamically stable Extremities: extremities warm and without acute gross deformities Neurological: No gross deficits   Assessment: 58 y.o. female with history as above here for surgical management.  Plan:  The diagnoses, recommended procedures, and plan of care was once again discussed in detail with family. All risks, benefits, and alternatives of the procedure were clearly elucidated. All questions were solicited and answered to their satisfaction.  Informed consent was obtained.  Patient and  plan of care discussed with Hulda Margrette Finder, * who is in agreement.   Duwaine Munda, MD         [1] Past Medical History: Diagnosis Date  . Depression   . Diabetes mellitus   . Hyperlipidemia   . Uterine cancer    (CMD)   [2] Past Surgical History: Procedure Laterality Date  . CESAREAN SECTION, UNSPECIFIED     Procedure: CESAREAN SECTION  . HYSTERECTOMY      Procedure: HYSTERECTOMY  . OTHER SURGICAL HISTORY     Procedure: OTHER SURGICAL HISTORY (  Endometrial cancer -hysteectomy. Uterus cancedr); 5 Years  . OTHER SURGICAL HISTORY     Procedure: OTHER SURGICAL HISTORY (Tubal Ligtation)  [3] Medications Prior to Admission  Medication Sig Dispense Refill Last Dose/Taking  . acetaminophen  (TYLENOL ) 500 mg tablet Take 1,000 mg by mouth every 4 (four) hours as needed for mild pain (1-3).   05/31/2024 at  7:00 PM  . apixaban  (ELIQUIS ) 2.5 mg tab Take 1 tablet (2.5 mg total) by mouth 2 (two) times a day. 180 tablet 2 05/28/2024  . blood-glucose sensor (Dexcom G7 Sensor) Place new device once every 10 days. 9 kit 3 05/19/2024  . docusate sodium  (COLACE) 100 mg capsule Take 100 mg by mouth daily as needed for constipation.   Past Week  . insulin  degludec (Tresiba FlexTouch U-100) 100 unit/mL (3 mL) pen Inject 25 Units under the skin daily.  Keep upcoming appt for continued refills. 30 mL 1 05/31/2024  . insulin  lispro (HumaLOG  KwikPen) 100 unit/mL KwikPen Inject 10 units before each meal- 3 times daily (Patient taking differently: Inject 10-12 units before each meal- 3 times daily) 9 mL 11 05/31/2024  . metFORMIN  (GLUCOPHAGE -XR) 500  mg 24 hr tablet TAKE 2 TABLETS(1000 MG) BY MOUTH TWICE DAILY 240 tablet 0 05/31/2024  . semaglutide (Ozempic) 1 mg/dose (4 mg/3 mL) subcutaneous pen injector Inject 1 mg under the skin once a week. (Patient taking differently: Inject 1 mg under the skin once a week. Injections on Mondays) 3 mL 11 05/11/2024  . ezetimibe (ZETIA) 10 mg tablet Take 1 tablet (10  mg total) by mouth daily. 90 tablet 2 More than a month  . Lancets misc Use one per blood sugar check. Check at least 2 times daily. 100 each 11   . ondansetron  (ZOFRAN ) 4 mg tablet Take 4 mg by mouth every 8 (eight) hours as needed for nausea.   More than a month  . pen needle, diabetic 29 gauge x 1/2 ndle Use to administer SQ insulin  4 times daily as directed 400 each 3   [4] Allergies Allergen Reactions  . Fish Containing Products Hives and Anaphylaxis    hives  . Oxycodone Dermatitis and Itching  [5] Family History Problem Relation Name Age of Onset  . Blindness Neg Hx    . Amblyopia Neg Hx    . Cataracts Neg Hx    . Glaucoma Neg Hx    . Macular degeneration Neg Hx    . Retinal detachment Neg Hx    . Strabismus Neg Hx

## 2024-06-01 NOTE — Care Plan (Signed)
 Urology Post-Operative Check  Date: Thu 06/01/2024 Surgeon: Dr. Vannie  Subjective:  Kristy [name] is a 58 y.o. female now immediately status post Procedure(s) (LRB): RIGHT ROBOTIC RADICAL NEPHRECTOMY (Right)   Since returning to the floor, the patient has been recovering well with no acute postoperative issues.  Pain is well controlled.  Tolerating clear liquid diet without nausea or vomiting.  Patient has foley catheter in place.  Objective:  Vitals:   06/01/24 2250  BP:   Pulse:   Resp: 17  Temp:   SpO2:       Intake/Output Summary (Last 24 hours) at 06/01/2024 2320 Last data filed at 06/01/2024 2206 Gross per 24 hour  Intake 2500 ml  Output 2150 ml  Net 350 ml     Physical Exam: Gen: No apparent distress. Resting comfortably in bed. Alert and oriented Neuro: Moving all 4 limbs spontaneously CV: Hemodynamically stable. Pulm: Normal work of breathing on 2L [place].   Abd: Soft, non-distended, appropriately tender over surgical incisions. No guarding, rebound tenderness or rigidity.  Wound: Covered with minimal drainage GU: Foley draining clear, yellow urine Extremities: Bilateral SCDs in place and functioning. No peripheral edema. Skin: warm, dry, and well perfused  Assessment/Plan: Kristy [name] is a 58 y.o. female s/p Procedure(s) (LRB): RIGHT ROBOTIC RADICAL NEPHRECTOMY (Right)  POD#0   Neuro: sched and prn PO pain meds CV: Normotensive, no acute concern Resp: Pulmonary toilet, incentive spirometry, OOB/ambulate as tolerated, wean [place] as tolerated GI/FEN:   F: MIVF  E: no acute concerns, replete as indicated  N: clear liquid diet; advance POD1 Wound: Routine wound care.  Heme: Bilateral SCDs. Follow up CBC in AM.  GU: Maintain foley catheter Disp: Floor status   Electronically signed by:  Garnette Aleene Mound, MD 06/01/2024 11:21 PM

## 2024-06-01 NOTE — Anesthesia Postprocedure Evaluation (Signed)
 Patient: Kristy [name]  Procedure Summary     Date: 06/01/24 Room / Location: [facility] OR 38 / [facility] OR   Anesthesia Start: 1419 Anesthesia Stop: 1947   Procedure: RIGHT ROBOTIC RADICAL NEPHRECTOMY (Right: Kidney) Diagnosis:      Right renal mass     (Right renal mass [N28.89])   Surgeons: Hulda Margrette Finder, MD Responsible Provider: Reyes Carlin Idol, MD   Anesthesia Type: general ASA Status: 2       Anesthesia Type: general  Vitals Value Taken Time  BP 135/82 06/01/24 21:15  Temp 97.5 F (36.4 C) 06/01/24 19:50  Pulse 102 06/01/24 21:18  Resp 12 06/01/24 21:18  SpO2 95 % 06/01/24 21:18  Vitals shown include unfiled device data.  There were no known notable events for this encounter.  Anesthesia Post Evaluation  Final anesthesia type: general Patient location during evaluation: PACU Patient participation: Patient participated Level of consciousness: awake and alert Pain score: pain well controlled (patient comfortable/resting) and 7/10 Pain management: moderate pain initially, now adequately controlled Post-op nausea and vomiting?: none Post-op vital signs: post-procedure vital signs are stable Patient temperature: Normothermic Cardiovascular status: hemodynamically stable Respiratory status: Stable, O2 nasal cannula, spontaneous Hydration status: adequately hydrated Post-op disposition: Inpatient Floor Anesthesia post-op complications?:no complications

## 2024-06-02 NOTE — Group Note (Signed)
 Inpatient Care Coordination Team Conference Note  06/02/2024   Time:10:42 AM   CSN: [id number]  DOB: 07/01/1966   Room/Bed: C913/A LOS: 1 Payor Info: Payor: [place] / Plan: [place] MEDICAID [employer] / Product Type: Managed Medicaid /    Admitting Diagnosis: Right renal mass [N28.89] Right kidney mass [N28.89]  Admit Date/Time: 06/01/2024 10:06 AM Admission Comments: No comment available   Primary Diagnosis: Right renal mass Principal Problem: Right renal mass  Predictive Model Details        12.4% (Medium)  Factor Value   Calculated 06/02/2024 08:08 22% Braden score 17   Readmission Risk Score v2 Model 7% Number of hospitalizations in last year 0    6% Latest hemoglobin in last 72 hrs 13.2 g/dL    6% Admission type not urgent    6% Number of active outpatient medication orders 11     Team Members Present: Case Manager, Systems Developer, Nurse, Nutritionist, Social Worker  Expected Discharge Date: Jun 02, 2024  [name], RN

## 2024-06-02 NOTE — Progress Notes (Signed)
 Case Management Screening  CSN: [id number] DOB: [birth year] Service: Urology Location: C913/A    Per chart notes:  58 y.o. female with Right renal mass [N28.89] Right kidney mass [N28.89] s/p Procedure(s) (LRB): RIGHT ROBOTIC RADICAL NEPHRECTOMY   Initial Screening Readmission Risk Score v2: 12.4 Risk Level: Low - Patient does not meet high risk criteria for post hospital services. Social Investment Banker, Operational will be available to assist as indicated.    CM reviewed chart and during huddle, no anticipated needs  [name], RN

## 2024-06-03 NOTE — Care Plan (Signed)
  Problem: Compromised Skin Integrity Goal: Skin integrity is maintained or improved Description: Assess and monitor skin integrity. Identify patients at risk for skin breakdown on admission and per policy. Collaborate with interdisciplinary team and initiate plans and interventions as needed.    Outcome: Progressing Goal: Nutritional status is improving Description: Monitor and assess patient for malnutrition (ex- brittle hair, bruises, dry skin, pale skin and conjunctiva, muscle wasting, smooth red tongue, and disorientation). Collaborate with interdisciplinary team and initiate plan and interventions as ordered.  Monitor patient's weight and dietary intake as ordered or per policy. Utilize nutrition screening tool and intervene per policy. Determine patient's food preferences and provide high-protein, high-caloric foods as appropriate.  Outcome: Progressing   Problem: Urinary Incontinence Goal: Perineal skin integrity is maintained or improved Description: Assess genitourinary system, perineal skin, labs (urinalysis), and history of incontinence to include past management, aggravating, and alleviating factors.  Collaborate with interdisciplinary team and initiate plans and interventions as needed. Outcome: Progressing   Problem: Coping: Goal: Ability to verbalize feelings will improve by discharge Outcome: Progressing Goal: Ability to verbalize ways to enhance comfort will improve by discharge Outcome: Progressing   Problem: Fluid Volume: Goal: Ability to maintain a balanced intake and output will improve by discharge Description: Ability to maintain a balanced intake and output will improve by discharge Outcome: Progressing   Problem: Urinary Elimination: Goal: Will remain free from infection by discharge Outcome: Progressing   Problem: Safety: Goal: Will remain free from falls by discharge Description: Will remain free from falls by discharge Outcome: Progressing

## 2024-06-03 NOTE — Care Plan (Signed)
 Problem: Knowledge Deficit Goal: Patient/family/caregiver demonstrates understanding of disease process, treatment plan, medications, and discharge instructions Description: Complete learning assessment and assess knowledge base. 06/03/2024 1117 by Joesph Almarie Dec, RN Outcome: Adequate for Discharge 06/03/2024 0941 by Joesph Almarie Dec, RN Outcome: Progressing   Problem: Compromised Skin Integrity Goal: Skin integrity is maintained or improved Description: Assess and monitor skin integrity. Identify patients at risk for skin breakdown on admission and per policy. Collaborate with interdisciplinary team and initiate plans and interventions as needed.    06/03/2024 1117 by Joesph Almarie Dec, RN Outcome: Adequate for Discharge 06/03/2024 [phone] by Joesph Almarie Dec, RN Outcome: Progressing Goal: Fluid and electrolyte balance are achieved/maintained Description: Assess and monitor vital signs (orthostatic vitals if applicable), fluid intake and output, urine color, labs, skin turgor, mucous membranes, jugular venous distention, edema, circumference of edematous extremities and abdominal girth, respiratory status, and mental status.  Monitor for signs and symptoms of hypovolemia (tachycardia, rapid breathing, decreased urine output, postural hypotension, confusion, syncope).  Monitor for signs and symptoms of hypervolemia (strong rapid pulse, shortness of breath, difficulty breathing lying down, crackles heard in lung fields, edema). Collaborate with interdisciplinary team and initiate plan and interventions as ordered. 06/03/2024 1117 by Joesph Almarie Dec, RN Outcome: Adequate for Discharge 06/03/2024 [phone] by Joesph Almarie Dec, RN Outcome: Progressing Goal: Nutritional status is improving Description: Monitor and assess patient for malnutrition (ex- brittle hair, bruises, dry skin, pale skin and conjunctiva, muscle wasting, smooth red tongue, and  disorientation). Collaborate with interdisciplinary team and initiate plan and interventions as ordered.  Monitor patient's weight and dietary intake as ordered or per policy. Utilize nutrition screening tool and intervene per policy. Determine patient's food preferences and provide high-protein, high-caloric foods as appropriate.  06/03/2024 1117 by Joesph Almarie Dec, RN Outcome: Adequate for Discharge 06/03/2024 [phone] by Joesph Almarie Dec, RN Outcome: Progressing   Problem: Urinary Incontinence Goal: Perineal skin integrity is maintained or improved Description: Assess genitourinary system, perineal skin, labs (urinalysis), and history of incontinence to include past management, aggravating, and alleviating factors.  Collaborate with interdisciplinary team and initiate plans and interventions as needed. 06/03/2024 1117 by Joesph Almarie Dec, RN Outcome: Adequate for Discharge 06/03/2024 0941 by Joesph Almarie Dec, RN Outcome: Progressing   Problem: Health Behavior: Goal: Knowledge of disease or condition will improve by discharge 06/03/2024 1117 by Joesph Almarie Dec, RN Outcome: Adequate for Discharge 06/03/2024 0941 by Joesph Almarie Dec, RN Outcome: Progressing Goal: Understanding of proper healthcare maintenance will be met by discharge Description: Understanding of proper healthcare maintenance will be met by discharge 06/03/2024 1117 by Joesph Almarie Dec, RN Outcome: Adequate for Discharge 06/03/2024 0941 by Joesph Almarie Dec, RN Outcome: Progressing Goal: Verbalization of signs and symptoms of infection will improve by discharge 06/03/2024 1117 by Joesph Almarie Dec, RN Outcome: Adequate for Discharge 06/03/2024 0941 by Joesph Almarie Dec, RN Outcome: Progressing   Problem: Coping: Goal: Ability to verbalize feelings will improve by discharge 06/03/2024 1117 by Joesph Almarie Dec, RN Outcome: Adequate for  Discharge 06/03/2024 0941 by Joesph Almarie Dec, RN Outcome: Progressing Goal: Ability to verbalize ways to enhance comfort will improve by discharge 06/03/2024 1117 by Joesph Almarie Dec, RN Outcome: Adequate for Discharge 06/03/2024 0941 by Joesph Almarie Dec, RN Outcome: Progressing   Problem: Fluid Volume: Goal: Ability to maintain a balanced intake and output will improve by discharge Description: Ability to maintain a balanced intake and output will improve by discharge 06/03/2024 1117 by Joesph Almarie Dec, RN Outcome: Adequate for  Discharge 06/03/2024 0941 by Joesph Almarie Dec, RN Outcome: Progressing   Problem: Health Behavior Goal: Understanding of treatment plan will improve by discharge 06/03/2024 1117 by Joesph Almarie Dec, RN Outcome: Adequate for Discharge 06/03/2024 0941 by Joesph Almarie Dec, RN Outcome: Progressing Goal: Compliance with treatment plan for underlying cause of condition will improve by discharge 06/03/2024 1117 by Joesph Almarie Dec, RN Outcome: Adequate for Discharge 06/03/2024 0941 by Joesph Almarie Dec, RN Outcome: Progressing Goal: Identification of resources available to assist in meeting health care needs will improve by discharge 06/03/2024 1117 by Joesph Almarie Dec, RN Outcome: Adequate for Discharge 06/03/2024 0941 by Joesph Almarie Dec, RN Outcome: Progressing Goal: Ability to care for self will improve by discharge 06/03/2024 1117 by Joesph Almarie Dec, RN Outcome: Adequate for Discharge 06/03/2024 0941 by Joesph Almarie Dec, RN Outcome: Progressing   Problem: Urinary Elimination: Goal: Ability to recognize the need to void and respond appropriately will improve by discharge 06/03/2024 1117 by Joesph Almarie Dec, RN Outcome: Adequate for Discharge 06/03/2024 0941 by Joesph Almarie Dec, RN Outcome: Progressing Goal: Ability to completely empty bladder  with each voiding will improve by discharge 06/03/2024 1117 by Joesph Almarie Dec, RN Outcome: Adequate for Discharge 06/03/2024 0941 by Joesph Almarie Dec, RN Outcome: Progressing Goal: Will remain free from infection by discharge 06/03/2024 1117 by Joesph Almarie Dec, RN Outcome: Adequate for Discharge 06/03/2024 0941 by Joesph Almarie Dec, RN Outcome: Progressing   Problem: Health Behavior: Goal: MCB Ability to state ways to decrease the risk of falls will be met by discharge Description: Ability to state ways to decrease the risk of falls will improve by discharge 06/03/2024 1117 by Joesph Almarie Dec, RN Outcome: Adequate for Discharge 06/03/2024 0941 by Joesph Almarie Dec, RN Outcome: Progressing   Problem: Safety: Goal: Will remain free from falls by discharge Description: Will remain free from falls by discharge 06/03/2024 1117 by Joesph Almarie Dec, RN Outcome: Adequate for Discharge 06/03/2024 0941 by Joesph Almarie Dec, RN Outcome: Progressing

## 2024-06-04 ENCOUNTER — Encounter (HOSPITAL_COMMUNITY): Payer: Self-pay

## 2024-06-04 ENCOUNTER — Emergency Department (HOSPITAL_COMMUNITY)
Admission: EM | Admit: 2024-06-04 | Discharge: 2024-06-04 | Disposition: A | Discharge status: Home / Self Care | Attending: Emergency Medicine | Admitting: Emergency Medicine

## 2024-06-04 DIAGNOSIS — Z466 Encounter for fitting and adjustment of urinary device: Secondary | ICD-10-CM | POA: Diagnosis present

## 2024-06-04 NOTE — ED Triage Notes (Signed)
 Pt POV d/t wanting her urinary catheter removed.  [facility] advised her to come in to have removed.

## 2024-06-04 NOTE — ED Provider Notes (Signed)
 [place] EMERGENCY DEPARTMENT AT [facility] Provider Note   CSN: [id number] Arrival date & time: 06/04/24  9449     Patient presents with: No chief complaint on file.   Kristy [name] is a 58 y.o. female who presents 48 hours postop from partial nephrectomy at [facility].  She presents with request for Foley catheter removal.  Discharge notes remark that catheter can be removed per urology direction.  Patient states that she called urology and stated they told her to present to closest local hospital for removal.  No fevers, chills, nausea vomiting diarrhea.   HPI     Prior to Admission medications   Medication Sig Start Date End Date Taking? Authorizing Provider  acetaminophen  (TYLENOL ) 500 MG tablet Take 2 tablets (1,000 mg total) by mouth every 6 (six) hours as needed. 01/03/24   Augustus Almarie RAMAN, PA-C  apixaban  (ELIQUIS ) 5 MG TABS tablet Take 1 tablet (5 mg total) by mouth 2 (two) times daily. Take for 3 months 01/17/24   Drusilla Sabas RAMAN, MD  docusate sodium  (COLACE) 100 MG capsule Take 1 capsule (100 mg total) by mouth 2 (two) times daily. 01/03/24   Augustus Almarie RAMAN, PA-C  glucose blood (AGAMATRIX PRESTO TEST) test strip Use as instructed 05/06/18   Kristy Suzann HERO, MD  Insulin  Glargine (LANTUS  SOLOSTAR) 100 UNIT/ML Solostar Pen Inject 31 Units into the skin 2 (two) times daily. Patient taking differently: Inject 25 Units into the skin daily. 03/30/19   Kristy Suzann HERO, MD  insulin  lispro (HUMALOG ) 100 UNIT/ML KiwkPen Inject 0.1 mLs (10 Units total) into the skin 2 (two) times daily. Patient taking differently: Inject 12 Units into the skin 2 (two) times daily. 05/06/18   Kristy Suzann HERO, MD  Insulin  Pen Needle (BD ULTRA-FINE PEN NEEDLES) 29G X 12.7MM MISC Check blood sugar 4x daily 05/06/18   Kristy Suzann HERO, MD  Lancets MISC Check BG prior to administering insulin  05/06/18   Kristy Suzann HERO, MD  levETIRAcetam  (KEPPRA ) 500 MG tablet Take 1 tablet (500 mg total) by mouth 2  (two) times daily for 5 days. 01/03/24 01/08/24  Augustus Almarie RAMAN, PA-C  metFORMIN  (GLUCOPHAGE -XR) 500 MG 24 hr tablet Take 1,000 mg by mouth 2 (two) times daily. 09/10/21   [provider]  methocarbamol  (ROBAXIN ) 500 MG tablet Take 1 tablet (500 mg total) by mouth every 8 (eight) hours as needed (neck pain/spasm, headache). 01/19/24   Bryn Bernardino NOVAK, MD  ondansetron  (ZOFRAN ) 4 MG tablet Take 1 tablet (4 mg total) by mouth every 6 (six) hours. Patient not taking: Reported on 01/15/2024 01/05/24   Marylu Gee, DO  traMADol  (ULTRAM ) 50 MG tablet Take 1 tablet (50 mg total) by mouth every 6 (six) hours as needed (severe pain). 01/03/24   Augustus Almarie RAMAN, PA-C    Allergies: Fish protein-containing drug products, Other, Fish allergy, and Oxycodone    Review of Systems  Updated Vital Signs BP 127/82   Pulse (!) 119   Temp 98.3 F (36.8 C) (Oral)   Resp 19   Ht 4' 11 (1.499 m)   Wt 81.2 kg   LMP 07/14/2016 (Exact Date)   SpO2 91%   BMI 36.15 kg/m   Physical Exam Vitals and nursing note reviewed.  Constitutional:      Appearance: She is not ill-appearing or toxic-appearing.  HENT:     Head: Normocephalic and atraumatic.     Mouth/Throat:     Mouth: Mucous membranes are moist.  Pharynx: No oropharyngeal exudate or posterior oropharyngeal erythema.  Eyes:     General:        Right eye: No discharge.        Left eye: No discharge.     Extraocular Movements: Extraocular movements intact.     Conjunctiva/sclera: Conjunctivae normal.     Pupils: Pupils are equal, round, and reactive to light.  Cardiovascular:     Rate and Rhythm: Normal rate and regular rhythm.     Pulses: Normal pulses.     Heart sounds: Normal heart sounds. No murmur heard. Pulmonary:     Effort: Pulmonary effort is normal. No respiratory distress.     Breath sounds: Normal breath sounds. No wheezing or rales.  Abdominal:     General: Bowel sounds are normal. There is no distension.     Palpations:  Abdomen is soft.     Tenderness: There is no abdominal tenderness.     Comments: Abdominal laparoscopic incisions are well-healing, without erythema or purulence.  Musculoskeletal:        General: No deformity.     Cervical back: Neck supple.     Right lower leg: No edema.     Left lower leg: No edema.  Skin:    General: Skin is warm and dry.     Capillary Refill: Capillary refill takes less than 2 seconds.  Neurological:     General: No focal deficit present.     Mental Status: She is alert and oriented to person, place, and time. Mental status is at baseline.  Psychiatric:        Mood and Affect: Mood normal.     (all labs ordered are listed, but only abnormal results are displayed) Labs Reviewed - No data to display  EKG: None  Radiology: No results found.   Procedures   Medications Ordered in the ED - No data to display                                  Medical Decision Making 58 year old female presents with request for Foley catheter removal.  Vitals reassuring on intake.  Good pulmonary Sam unremarkable, [name] exam with well-healing surgical scars.    Foley catheter removed by ED RN, patient did not wish to stay in the emergency department to void.  She was given strict return precautions for likely repeat catheter placement if unable to void at home today.  Recommend close outpatient follow-up with her surgeon.  [name] voiced understanding of her medical evaluation and treatment plan. Each of their questions answered to their expressed satisfaction.  Return precautions were given.  Patient is well-appearing, stable, and was discharged in good condition.   This chart was dictated using voice recognition software, Dragon. Despite the best efforts of this provider to proofread and correct errors, errors may still occur which can change documentation meaning.      Final diagnoses:  Encounter for Foley catheter removal    ED Discharge Orders     None           [name] 06/04/24 0703    Jerral Meth, MD 06/10/24 2300

## 2024-06-04 NOTE — Discharge Instructions (Signed)
 Kristy [name] is seen in the ER today for management of her Foley catheter.  This was removed in the ER.  She should follow-up with her surgeon as previously scheduled.  If she is unable to urinate should return to the emergency department for evaluation and possible catheter replacement.  Return to the ER with any severe symptoms.
